# Patient Record
Sex: Male | Born: 1950 | Hispanic: No | Marital: Married | Smoking: Never smoker
Health system: Southern US, Community
[De-identification: ages and names within clinical notes are randomized; demographics above are authoritative.]

## PROBLEM LIST (undated history)

## (undated) DIAGNOSIS — F419 Anxiety disorder, unspecified: Secondary | ICD-10-CM

---

## 2010-03-22 DIAGNOSIS — C801 Malignant (primary) neoplasm, unspecified: Secondary | ICD-10-CM

## 2010-03-22 HISTORY — DX: Malignant (primary) neoplasm, unspecified: C80.1

## 2010-03-22 HISTORY — PX: COLON SURGERY: SHX602

## 2020-06-09 ENCOUNTER — Other Ambulatory Visit (HOSPITAL_COMMUNITY): Payer: Self-pay | Admitting: Neurology

## 2020-06-09 ENCOUNTER — Other Ambulatory Visit: Payer: Self-pay | Admitting: Neurology

## 2020-06-09 DIAGNOSIS — G40009 Localization-related (focal) (partial) idiopathic epilepsy and epileptic syndromes with seizures of localized onset, not intractable, without status epilepticus: Secondary | ICD-10-CM

## 2020-06-24 ENCOUNTER — Other Ambulatory Visit: Payer: Self-pay

## 2020-06-24 ENCOUNTER — Ambulatory Visit (HOSPITAL_COMMUNITY)
Admission: RE | Admit: 2020-06-24 | Discharge: 2020-06-24 | Disposition: A | Discharge status: Home / Self Care | Payer: Medicare HMO | Source: Ambulatory Visit | Attending: Neurology | Admitting: Neurology

## 2020-06-24 DIAGNOSIS — G40009 Localization-related (focal) (partial) idiopathic epilepsy and epileptic syndromes with seizures of localized onset, not intractable, without status epilepticus: Secondary | ICD-10-CM | POA: Insufficient documentation

## 2020-06-24 MED ORDER — GADOBUTROL 1 MMOL/ML IV SOLN
10.0000 mL | Freq: Once | INTRAVENOUS | Status: AC | PRN
  Administered 2020-06-24: 10 mL via INTRAVENOUS

## 2020-09-02 ENCOUNTER — Other Ambulatory Visit: Payer: Self-pay | Admitting: Neurosurgery

## 2020-09-02 ENCOUNTER — Other Ambulatory Visit (HOSPITAL_COMMUNITY): Payer: Self-pay | Admitting: Neurosurgery

## 2020-09-02 DIAGNOSIS — D329 Benign neoplasm of meninges, unspecified: Secondary | ICD-10-CM

## 2020-09-08 ENCOUNTER — Other Ambulatory Visit: Payer: Self-pay | Admitting: Neurosurgery

## 2020-09-12 ENCOUNTER — Ambulatory Visit (HOSPITAL_COMMUNITY)
Admission: RE | Admit: 2020-09-12 | Discharge: 2020-09-12 | Disposition: A | Discharge status: Home / Self Care | Payer: Medicare HMO | Source: Ambulatory Visit | Attending: Neurosurgery | Admitting: Neurosurgery

## 2020-09-12 ENCOUNTER — Other Ambulatory Visit: Payer: Self-pay

## 2020-09-12 DIAGNOSIS — D329 Benign neoplasm of meninges, unspecified: Secondary | ICD-10-CM | POA: Diagnosis not present

## 2020-09-12 MED ORDER — GADOBUTROL 1 MMOL/ML IV SOLN
9.0000 mL | Freq: Once | INTRAVENOUS | Status: AC | PRN
  Administered 2020-09-12: 9 mL via INTRAVENOUS

## 2020-09-19 NOTE — Pre-Procedure Instructions (Signed)
Surgical Instructions    Your procedure is scheduled on Wednesday July 6th.  Report to [facility] Main Entrance "A" at 06:30 A.M., then check in with the Admitting office.  Call this number if you have problems the morning of surgery:  [phone]   If you have any questions prior to your surgery date call [phone]: Open Monday-Friday 8am-4pm    Remember:  Do not eat or drink after midnight the night before your surgery     Take these medicines the morning of surgery with A SIP OF WATER   atorvastatin (LIPITOR)  escitalopram (LEXAPRO)   levETIRAcetam (KEPPRA)  Sodium Chloride-Sodium Bicarb   As of today, STOP taking any Aspirin (unless otherwise instructed by your surgeon) Aleve, Naproxen, Ibuprofen, Motrin, Advil, Goody's, BC's, all herbal medications, fish oil, and all vitamins.                     Do NOT Smoke (Tobacco/Vaping) or drink Alcohol 24 hours prior to your procedure.  If you use a CPAP at night, you may bring all equipment for your overnight stay.   Contacts, glasses, piercing's, hearing aid's, dentures or partials may not be worn into surgery, please bring cases for these belongings.    For patients admitted to the hospital, discharge time will be determined by your treatment team.   Patients discharged the day of surgery will not be allowed to drive home, and someone needs to stay with them for 24 hours.  ONLY 1 SUPPORT PERSON MAY BE PRESENT WHILE YOU ARE IN SURGERY. IF YOU ARE TO BE ADMITTED ONCE YOU ARE IN YOUR ROOM YOU WILL BE ALLOWED TWO (2) VISITORS.  Minor children may have two parents present. Special consideration for safety and communication needs will be reviewed on a case by case basis.   Special instructions:   [place]- Preparing For Surgery  Before surgery, you can play an important role. Because skin is not sterile, your skin needs to be as free of germs as possible. You can reduce the number of germs on your skin by washing with CHG  (chlorahexidine gluconate) Soap before surgery.  CHG is an antiseptic cleaner which kills germs and bonds with the skin to continue killing germs even after washing.    Oral Hygiene is also important to reduce your risk of infection.  Remember - BRUSH YOUR TEETH THE MORNING OF SURGERY WITH YOUR REGULAR TOOTHPASTE  Please do not use if you have an allergy to CHG or antibacterial soaps. If your skin becomes reddened/irritated stop using the CHG.  Do not shave (including legs and underarms) for at least 48 hours prior to first CHG shower. It is OK to shave your face.  Please follow these instructions carefully.   Shower the NIGHT BEFORE SURGERY and the MORNING OF SURGERY  If you chose to wash your hair, wash your hair first as usual with your normal shampoo.  After you shampoo, rinse your hair and body thoroughly to remove the shampoo.  Use CHG Soap as you would any other liquid soap. You can apply CHG directly to the skin and wash gently with a scrungie or a clean washcloth.   Apply the CHG Soap to your body ONLY FROM THE NECK DOWN.  Do not use on open wounds or open sores. Avoid contact with your eyes, ears, mouth and genitals (private parts). Wash Face and genitals (private parts)  with your normal soap.   Wash thoroughly, paying special attention to the area  where your surgery will be performed.  Thoroughly rinse your body with warm water from the neck down.  DO NOT shower/wash with your normal soap after using and rinsing off the CHG Soap.  Pat yourself dry with a CLEAN TOWEL.  Wear CLEAN PAJAMAS to bed the night before surgery  Place CLEAN SHEETS on your bed the night before your surgery  DO NOT SLEEP WITH PETS.   Day of Surgery: Shower with CHG soap. Do not wear jewelry, make up, nail polish, gel polish, artificial nails, or any other type of covering on natural nails including finger and toenails. If patients have artificial nails, gel coating, etc. that need to be removed  by a nail salon please have this removed prior to surgery. Surgery may need to be canceled/delayed if the surgeon/ anesthesia feels like the patient is unable to be adequately monitored. Do not wear lotions, powders, perfumes/colognes, or deodorant. Do not shave 48 hours prior to surgery.  Men may shave face and neck. Do not bring valuables to the hospital. [facility] is not responsible for any belongings or valuables. Wear Clean/Comfortable clothing the morning of surgery Remember to brush your teeth WITH YOUR REGULAR TOOTHPASTE.   Please read over the following fact sheets that you were given.

## 2020-09-23 ENCOUNTER — Encounter (HOSPITAL_COMMUNITY): Payer: Self-pay

## 2020-09-23 ENCOUNTER — Other Ambulatory Visit: Payer: Self-pay

## 2020-09-23 ENCOUNTER — Encounter (HOSPITAL_COMMUNITY)
Admission: RE | Admit: 2020-09-23 | Discharge: 2020-09-23 | Disposition: A | Discharge status: Home / Self Care | Payer: Medicare HMO | Source: Ambulatory Visit | Attending: Neurosurgery | Admitting: Neurosurgery

## 2020-09-23 DIAGNOSIS — Z01818 Encounter for other preprocedural examination: Secondary | ICD-10-CM | POA: Insufficient documentation

## 2020-09-23 DIAGNOSIS — Z20822 Contact with and (suspected) exposure to covid-19: Secondary | ICD-10-CM | POA: Insufficient documentation

## 2020-09-23 HISTORY — DX: Anxiety disorder, unspecified: F41.9

## 2020-09-23 LAB — CBC
HCT: 45.3 % (ref 39.0–52.0)
Hemoglobin: 14.9 g/dL (ref 13.0–17.0)
MCH: 28.8 pg (ref 26.0–34.0)
MCHC: 32.9 g/dL (ref 30.0–36.0)
MCV: 87.5 fL (ref 80.0–100.0)
Platelets: 230 10*3/uL (ref 150–400)
RBC: 5.18 MIL/uL (ref 4.22–5.81)
RDW: 12.8 % (ref 11.5–15.5)
WBC: 9.1 10*3/uL (ref 4.0–10.5)
nRBC: 0 % (ref 0.0–0.2)

## 2020-09-23 LAB — SARS CORONAVIRUS 2 (TAT 6-24 HRS): SARS Coronavirus 2: NEGATIVE

## 2020-09-23 LAB — BASIC METABOLIC PANEL
Anion gap: 5 (ref 5–15)
BUN: 15 mg/dL (ref 8–23)
CO2: 28 mmol/L (ref 22–32)
Calcium: 9.1 mg/dL (ref 8.9–10.3)
Chloride: 105 mmol/L (ref 98–111)
Creatinine, Ser: 1.12 mg/dL (ref 0.61–1.24)
GFR, Estimated: >60 mL/min (ref >60)
Glucose, Bld: 102 mg/dL — ABNORMAL HIGH (ref 70–99)
Potassium: 4.1 mmol/L (ref 3.5–5.1)
Sodium: 138 mmol/L (ref 135–145)

## 2020-09-23 NOTE — Anesthesia Preprocedure Evaluation (Addendum)
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    GI/Hepatic negative GI ROS, Neg liver ROS, H/o colon cancer   Endo/Other  negative endocrine ROS  Renal/GU negative Renal ROS     Musculoskeletal   Abdominal   Peds  Hematology negative hematology ROS (+)   Anesthesia Other Findings   Reproductive/Obstetrics                            Anesthesia Physical Anesthesia Plan  ASA: 3  Anesthesia Plan: General   Post-op Pain Management:    Induction: Intravenous  PONV Risk Score and Plan: 2 and Ondansetron and Dexamethasone  Airway Management Planned: Oral ETT  Additional Equipment: Arterial line  Intra-op Plan:   Post-operative Plan: Possible Post-op intubation/ventilation  Informed Consent: I have reviewed the patients History and Physical, chart, labs and discussed the procedure including the risks, benefits and alternatives for the proposed anesthesia with the patient or authorized representative who has indicated his/her understanding and acceptance.     Dental advisory given  Plan Discussed with: CRNA and Surgeon  Anesthesia Plan Comments:        Anesthesia Quick Evaluation

## 2020-09-23 NOTE — Progress Notes (Signed)
PCP - Suzzanne Cloud, PA at [facility] Cardiologist - Denies  PPM/ICD - n/a Device Orders - n/a Rep Notified - n/a  Chest x-ray - n/a EKG - 09/23/20. Talked to Lorretta Harp, PA in anesthesia. Per notes patient has elevated BP readings without diagnosis of hypertension. Ok to obtain EKG today.  Stress Test - denies ECHO - denies Cardiac Cath - denies  Sleep Study - denies CPAP - denies  Fasting Blood Sugar - n/a Checks Blood Sugar- n/a  Blood Thinner Instructions: n/a Aspirin Instructions: Per patient he stopped taking Aspirin approximately 2 weeks ago and has never taken Aspirin regularly.   COVID TEST- 09/23/20 at PAT appointment. Pending.    Anesthesia review: No.   Patient denies shortness of breath, fever, cough and chest pain at PAT appointment   All instructions explained to the patient, with a verbal understanding of the material. Patient agrees to go over the instructions while at home for a better understanding. Patient also instructed to self quarantine after being tested for COVID-19. The opportunity to ask questions was provided.

## 2020-09-24 ENCOUNTER — Inpatient Hospital Stay (HOSPITAL_COMMUNITY): Payer: Medicare HMO | Admitting: Certified Registered Nurse Anesthetist

## 2020-09-24 ENCOUNTER — Inpatient Hospital Stay (HOSPITAL_COMMUNITY)
Admission: RE | Admit: 2020-09-24 | Discharge: 2020-10-01 | DRG: 025 | Disposition: A | Discharge status: Rehabilitation Facility | Payer: Medicare HMO | Attending: Neurosurgery | Admitting: Neurosurgery

## 2020-09-24 ENCOUNTER — Inpatient Hospital Stay (HOSPITAL_COMMUNITY)
Admission: RE | Disposition: A | Discharge status: Still In Hospital | Payer: Self-pay | Source: Home / Self Care | Attending: Neurosurgery

## 2020-09-24 DIAGNOSIS — D320 Benign neoplasm of cerebral meninges: Secondary | ICD-10-CM | POA: Diagnosis not present

## 2020-09-24 DIAGNOSIS — G40909 Epilepsy, unspecified, not intractable, without status epilepticus: Secondary | ICD-10-CM | POA: Diagnosis not present

## 2020-09-24 DIAGNOSIS — Z79899 Other long term (current) drug therapy: Secondary | ICD-10-CM

## 2020-09-24 DIAGNOSIS — Z85038 Personal history of other malignant neoplasm of large intestine: Secondary | ICD-10-CM | POA: Diagnosis not present

## 2020-09-24 DIAGNOSIS — Z20822 Contact with and (suspected) exposure to covid-19: Secondary | ICD-10-CM | POA: Diagnosis not present

## 2020-09-24 DIAGNOSIS — R7989 Other specified abnormal findings of blood chemistry: Secondary | ICD-10-CM | POA: Diagnosis not present

## 2020-09-24 DIAGNOSIS — R252 Cramp and spasm: Secondary | ICD-10-CM | POA: Diagnosis not present

## 2020-09-24 DIAGNOSIS — F419 Anxiety disorder, unspecified: Secondary | ICD-10-CM | POA: Diagnosis present

## 2020-09-24 DIAGNOSIS — G8194 Hemiplegia, unspecified affecting left nondominant side: Secondary | ICD-10-CM | POA: Diagnosis present

## 2020-09-24 DIAGNOSIS — R569 Unspecified convulsions: Secondary | ICD-10-CM | POA: Diagnosis present

## 2020-09-24 DIAGNOSIS — G936 Cerebral edema: Secondary | ICD-10-CM | POA: Diagnosis not present

## 2020-09-24 DIAGNOSIS — Z86018 Personal history of other benign neoplasm: Secondary | ICD-10-CM

## 2020-09-24 DIAGNOSIS — Z483 Aftercare following surgery for neoplasm: Secondary | ICD-10-CM | POA: Diagnosis not present

## 2020-09-24 DIAGNOSIS — Z7982 Long term (current) use of aspirin: Secondary | ICD-10-CM

## 2020-09-24 DIAGNOSIS — Z8249 Family history of ischemic heart disease and other diseases of the circulatory system: Secondary | ICD-10-CM

## 2020-09-24 DIAGNOSIS — Z9889 Other specified postprocedural states: Secondary | ICD-10-CM

## 2020-09-24 HISTORY — PX: CRANIOTOMY: SHX93

## 2020-09-24 HISTORY — PX: APPLICATION OF CRANIAL NAVIGATION: SHX6578

## 2020-09-24 LAB — TYPE AND SCREEN
ABO/RH(D): O POS
Antibody Screen: NEGATIVE
Unit division: 0
Unit division: 0

## 2020-09-24 LAB — BPAM RBC
Blood Product Expiration Date: 202208052359
Blood Product Expiration Date: 202208052359
ISSUE DATE / TIME: 202207061014
ISSUE DATE / TIME: 202207061014
Unit Type and Rh: 5100
Unit Type and Rh: 5100

## 2020-09-24 LAB — PREPARE RBC (CROSSMATCH)

## 2020-09-24 LAB — ABO/RH: ABO/RH(D): O POS

## 2020-09-24 SURGERY — CRANIOTOMY TUMOR EXCISION
Anesthesia: General | Laterality: Right

## 2020-09-24 MED ORDER — LIDOCAINE 2% (20 MG/ML) 5 ML SYRINGE
INTRAMUSCULAR | Status: AC
  Filled 2020-09-24: qty 5

## 2020-09-24 MED ORDER — DEXAMETHASONE 4 MG PO TABS
4.0000 mg | ORAL_TABLET | Freq: Four times a day (QID) | ORAL | Status: AC
  Administered 2020-09-24 – 2020-09-26 (×8): 4 mg via ORAL
  Filled 2020-09-24 (×8): qty 1

## 2020-09-24 MED ORDER — POLYETHYLENE GLYCOL 3350 17 G PO PACK: 17.0000 g | PACK | Freq: Every day | ORAL | Status: DC | PRN

## 2020-09-24 MED ORDER — GLYCOPYRROLATE PF 0.2 MG/ML IJ SOSY
PREFILLED_SYRINGE | INTRAMUSCULAR | Status: DC | PRN
  Administered 2020-09-24: .2 mg via INTRAVENOUS

## 2020-09-24 MED ORDER — ACETAMINOPHEN 500 MG PO TABS: 1000.0000 mg | ORAL_TABLET | Freq: Once | ORAL | Status: DC

## 2020-09-24 MED ORDER — CHLORHEXIDINE GLUCONATE 0.12 % MT SOLN: 15.0000 mL | Freq: Once | OROMUCOSAL | Status: DC

## 2020-09-24 MED ORDER — MORPHINE SULFATE (PF) 2 MG/ML IV SOLN: 1.0000 mg | INTRAVENOUS | Status: DC | PRN

## 2020-09-24 MED ORDER — LIDOCAINE-EPINEPHRINE 1 %-1:100000 IJ SOLN
INTRAMUSCULAR | Status: AC
  Filled 2020-09-24: qty 1

## 2020-09-24 MED ORDER — ATORVASTATIN CALCIUM 10 MG PO TABS
10.0000 mg | ORAL_TABLET | Freq: Every day | ORAL | Status: DC
  Administered 2020-09-24 – 2020-10-01 (×8): 10 mg via ORAL
  Filled 2020-09-24 (×8): qty 1

## 2020-09-24 MED ORDER — EPHEDRINE SULFATE-NACL 50-0.9 MG/10ML-% IV SOSY
PREFILLED_SYRINGE | INTRAVENOUS | Status: DC | PRN
  Administered 2020-09-24: 5 mg via INTRAVENOUS

## 2020-09-24 MED ORDER — PROMETHAZINE HCL 25 MG/ML IJ SOLN: 6.2500 mg | INTRAMUSCULAR | Status: DC | PRN

## 2020-09-24 MED ORDER — PROPOFOL 10 MG/ML IV BOLUS
INTRAVENOUS | Status: AC
  Filled 2020-09-24: qty 40

## 2020-09-24 MED ORDER — ROCURONIUM BROMIDE 10 MG/ML (PF) SYRINGE
PREFILLED_SYRINGE | INTRAVENOUS | Status: AC
  Filled 2020-09-24: qty 10

## 2020-09-24 MED ORDER — DEXAMETHASONE 0.5 MG PO TABS: 1.0000 mg | ORAL_TABLET | Freq: Every day | ORAL | Status: DC

## 2020-09-24 MED ORDER — PHENYLEPHRINE HCL-NACL 10-0.9 MG/250ML-% IV SOLN
INTRAVENOUS | Status: DC | PRN
  Administered 2020-09-24: 40 ug/min via INTRAVENOUS

## 2020-09-24 MED ORDER — SODIUM CHLORIDE 0.9 % IV SOLN
0.0500 ug/kg/min | INTRAVENOUS | Status: DC
  Administered 2020-09-24: .1 ug/kg/min via INTRAVENOUS
  Filled 2020-09-24 (×2): qty 5000

## 2020-09-24 MED ORDER — NICARDIPINE HCL IN NACL 20-0.86 MG/200ML-% IV SOLN: 3.0000 mg/h | INTRAVENOUS | Status: DC

## 2020-09-24 MED ORDER — MEPERIDINE HCL 25 MG/ML IJ SOLN: 6.2500 mg | INTRAMUSCULAR | Status: DC | PRN

## 2020-09-24 MED ORDER — CHLORHEXIDINE GLUCONATE 0.12 % MT SOLN
OROMUCOSAL | Status: AC
  Administered 2020-09-24: 15 mL
  Filled 2020-09-24: qty 15

## 2020-09-24 MED ORDER — THROMBIN 5000 UNITS EX SOLR
OROMUCOSAL | Status: DC | PRN
  Administered 2020-09-24: 5 mL via TOPICAL

## 2020-09-24 MED ORDER — BACITRACIN ZINC 500 UNIT/GM EX OINT
TOPICAL_OINTMENT | CUTANEOUS | Status: AC
  Filled 2020-09-24: qty 28.35

## 2020-09-24 MED ORDER — SUGAMMADEX SODIUM 200 MG/2ML IV SOLN
INTRAVENOUS | Status: DC | PRN
  Administered 2020-09-24: 200 mg via INTRAVENOUS

## 2020-09-24 MED ORDER — MIDAZOLAM HCL 2 MG/2ML IJ SOLN
0.5000 mg | Freq: Once | INTRAMUSCULAR | Status: DC | PRN
Start: 2020-09-24 — End: 2020-09-24

## 2020-09-24 MED ORDER — ONDANSETRON HCL 4 MG/2ML IJ SOLN: 4.0000 mg | INTRAMUSCULAR | Status: DC | PRN

## 2020-09-24 MED ORDER — BACITRACIN ZINC 500 UNIT/GM EX OINT
TOPICAL_OINTMENT | CUTANEOUS | Status: DC | PRN
  Administered 2020-09-24: 1 via TOPICAL

## 2020-09-24 MED ORDER — FENTANYL CITRATE (PF) 250 MCG/5ML IJ SOLN
INTRAMUSCULAR | Status: DC | PRN
  Administered 2020-09-24: 100 ug via INTRAVENOUS
  Administered 2020-09-24: 150 ug via INTRAVENOUS
  Administered 2020-09-24: 250 ug via INTRAVENOUS

## 2020-09-24 MED ORDER — CHLORHEXIDINE GLUCONATE CLOTH 2 % EX PADS: 6.0000 | MEDICATED_PAD | Freq: Once | CUTANEOUS | Status: DC

## 2020-09-24 MED ORDER — CEFAZOLIN SODIUM-DEXTROSE 1-4 GM/50ML-% IV SOLN
1.0000 g | Freq: Three times a day (TID) | INTRAVENOUS | Status: AC
  Administered 2020-09-24 – 2020-09-25 (×3): 1 g via INTRAVENOUS
  Filled 2020-09-24 (×3): qty 50

## 2020-09-24 MED ORDER — ONDANSETRON HCL 4 MG/2ML IJ SOLN
INTRAMUSCULAR | Status: DC | PRN
  Administered 2020-09-24: 4 mg via INTRAVENOUS

## 2020-09-24 MED ORDER — PANTOPRAZOLE SODIUM 40 MG PO TBEC
40.0000 mg | DELAYED_RELEASE_TABLET | Freq: Every day | ORAL | Status: DC
  Administered 2020-09-24 – 2020-10-01 (×8): 40 mg via ORAL
  Filled 2020-09-24 (×8): qty 1

## 2020-09-24 MED ORDER — DOCUSATE SODIUM 100 MG PO CAPS
100.0000 mg | ORAL_CAPSULE | Freq: Two times a day (BID) | ORAL | Status: DC
  Administered 2020-09-24 – 2020-10-01 (×13): 100 mg via ORAL
  Filled 2020-09-24 (×14): qty 1

## 2020-09-24 MED ORDER — POTASSIUM CHLORIDE IN NACL 20-0.9 MEQ/L-% IV SOLN
INTRAVENOUS | Status: DC
  Filled 2020-09-24 (×2): qty 1000

## 2020-09-24 MED ORDER — MIDAZOLAM HCL 2 MG/2ML IJ SOLN
INTRAMUSCULAR | Status: AC
  Filled 2020-09-24: qty 2

## 2020-09-24 MED ORDER — ACETAMINOPHEN 650 MG RE SUPP: 650.0000 mg | RECTAL | Status: DC | PRN

## 2020-09-24 MED ORDER — DEXAMETHASONE 4 MG PO TABS
2.0000 mg | ORAL_TABLET | Freq: Two times a day (BID) | ORAL | Status: DC
  Administered 2020-09-30 – 2020-10-01 (×2): 2 mg via ORAL
  Filled 2020-09-24 (×2): qty 1

## 2020-09-24 MED ORDER — GLYCOPYRROLATE PF 0.2 MG/ML IJ SOSY
PREFILLED_SYRINGE | INTRAMUSCULAR | Status: AC
  Filled 2020-09-24: qty 1

## 2020-09-24 MED ORDER — ONDANSETRON HCL 4 MG PO TABS: 4.0000 mg | ORAL_TABLET | ORAL | Status: DC | PRN

## 2020-09-24 MED ORDER — LIDOCAINE 2% (20 MG/ML) 5 ML SYRINGE
INTRAMUSCULAR | Status: DC | PRN
  Administered 2020-09-24: 20 mg via INTRAVENOUS

## 2020-09-24 MED ORDER — SODIUM CHLORIDE 0.9 % IV SOLN: INTRAVENOUS | Status: DC | PRN

## 2020-09-24 MED ORDER — MICROFIBRILLAR COLL HEMOSTAT EX PADS: MEDICATED_PAD | CUTANEOUS | Status: DC | PRN

## 2020-09-24 MED ORDER — LEVETIRACETAM 750 MG PO TABS
750.0000 mg | ORAL_TABLET | Freq: Two times a day (BID) | ORAL | Status: DC
  Administered 2020-09-24 – 2020-10-01 (×14): 750 mg via ORAL
  Filled 2020-09-24 (×14): qty 1

## 2020-09-24 MED ORDER — LIDOCAINE-EPINEPHRINE 1 %-1:100000 IJ SOLN
INTRAMUSCULAR | Status: DC | PRN
  Administered 2020-09-24: 10 mL

## 2020-09-24 MED ORDER — ENALAPRILAT 1.25 MG/ML IV SOLN
1.2500 mg | Freq: Four times a day (QID) | INTRAVENOUS | Status: DC | PRN
  Filled 2020-09-24: qty 1

## 2020-09-24 MED ORDER — ROCURONIUM BROMIDE 10 MG/ML (PF) SYRINGE
PREFILLED_SYRINGE | INTRAVENOUS | Status: DC | PRN
  Administered 2020-09-24: 20 mg via INTRAVENOUS
  Administered 2020-09-24: 60 mg via INTRAVENOUS
  Administered 2020-09-24: 40 mg via INTRAVENOUS
  Administered 2020-09-24: 30 mg via INTRAVENOUS
  Administered 2020-09-24: 50 mg via INTRAVENOUS

## 2020-09-24 MED ORDER — ESCITALOPRAM OXALATE 10 MG PO TABS
10.0000 mg | ORAL_TABLET | Freq: Every day | ORAL | Status: DC
  Administered 2020-09-24 – 2020-10-01 (×8): 10 mg via ORAL
  Filled 2020-09-24 (×8): qty 1

## 2020-09-24 MED ORDER — HYDROCODONE-ACETAMINOPHEN 5-325 MG PO TABS
1.0000 | ORAL_TABLET | ORAL | Status: DC | PRN
  Administered 2020-09-24: 1 via ORAL
  Filled 2020-09-24: qty 1

## 2020-09-24 MED ORDER — FENTANYL CITRATE (PF) 100 MCG/2ML IJ SOLN: 25.0000 ug | INTRAMUSCULAR | Status: DC | PRN

## 2020-09-24 MED ORDER — MANNITOL 25 % IV SOLN
INTRAVENOUS | Status: DC | PRN
  Administered 2020-09-24: 50 g via INTRAVENOUS

## 2020-09-24 MED ORDER — ONDANSETRON HCL 4 MG/2ML IJ SOLN
INTRAMUSCULAR | Status: AC
  Filled 2020-09-24: qty 2

## 2020-09-24 MED ORDER — OXYCODONE HCL 5 MG/5ML PO SOLN: 5.0000 mg | Freq: Once | ORAL | Status: DC | PRN

## 2020-09-24 MED ORDER — FLEET ENEMA 7-19 GM/118ML RE ENEM: 1.0000 | ENEMA | Freq: Once | RECTAL | Status: DC | PRN

## 2020-09-24 MED ORDER — SODIUM CHLORIDE 0.9% IV SOLUTION: Freq: Once | INTRAVENOUS | Status: DC

## 2020-09-24 MED ORDER — THROMBIN 5000 UNITS EX SOLR
CUTANEOUS | Status: AC
  Filled 2020-09-24: qty 10000

## 2020-09-24 MED ORDER — OXYCODONE HCL 5 MG PO TABS: 5.0000 mg | ORAL_TABLET | Freq: Once | ORAL | Status: DC | PRN

## 2020-09-24 MED ORDER — THROMBIN 20000 UNITS EX SOLR
CUTANEOUS | Status: AC
  Filled 2020-09-24: qty 20000

## 2020-09-24 MED ORDER — SODIUM CHLORIDE 0.9 % IV SOLN
0.0125 ug/kg/min | INTRAVENOUS | Status: DC
  Filled 2020-09-24: qty 2000

## 2020-09-24 MED ORDER — ACETAMINOPHEN 325 MG PO TABS
650.0000 mg | ORAL_TABLET | ORAL | Status: DC | PRN
  Administered 2020-09-25: 650 mg via ORAL
  Filled 2020-09-24 (×2): qty 2

## 2020-09-24 MED ORDER — CEFAZOLIN SODIUM-DEXTROSE 2-4 GM/100ML-% IV SOLN: 2.0000 g | INTRAVENOUS | Status: DC

## 2020-09-24 MED ORDER — THROMBIN 20000 UNITS EX SOLR: CUTANEOUS | Status: DC | PRN

## 2020-09-24 MED ORDER — FENTANYL CITRATE (PF) 250 MCG/5ML IJ SOLN
INTRAMUSCULAR | Status: AC
  Filled 2020-09-24: qty 5

## 2020-09-24 MED ORDER — DEXAMETHASONE SODIUM PHOSPHATE 10 MG/ML IJ SOLN
INTRAMUSCULAR | Status: DC | PRN
  Administered 2020-09-24: 10 mg via INTRAVENOUS

## 2020-09-24 MED ORDER — DEXAMETHASONE SODIUM PHOSPHATE 10 MG/ML IJ SOLN
INTRAMUSCULAR | Status: AC
  Filled 2020-09-24: qty 1

## 2020-09-24 MED ORDER — LACTATED RINGERS IV SOLN: INTRAVENOUS | Status: DC

## 2020-09-24 MED ORDER — DEXAMETHASONE 4 MG PO TABS
2.0000 mg | ORAL_TABLET | Freq: Three times a day (TID) | ORAL | Status: AC
  Administered 2020-09-28 – 2020-09-30 (×6): 2 mg via ORAL
  Filled 2020-09-24 (×6): qty 1

## 2020-09-24 MED ORDER — MIDAZOLAM HCL 2 MG/2ML IJ SOLN
INTRAMUSCULAR | Status: DC | PRN
  Administered 2020-09-24: 2 mg via INTRAVENOUS

## 2020-09-24 MED ORDER — PROPOFOL 10 MG/ML IV BOLUS
INTRAVENOUS | Status: DC | PRN
  Administered 2020-09-24: 30 mg via INTRAVENOUS
  Administered 2020-09-24: 100 mg via INTRAVENOUS
  Administered 2020-09-24: 40 mg via INTRAVENOUS

## 2020-09-24 MED ORDER — DEXAMETHASONE 4 MG PO TABS
4.0000 mg | ORAL_TABLET | Freq: Three times a day (TID) | ORAL | Status: AC
  Administered 2020-09-26 – 2020-09-28 (×6): 4 mg via ORAL
  Filled 2020-09-24 (×7): qty 1

## 2020-09-24 MED ORDER — HEMOSTATIC AGENTS (NO CHARGE) OPTIME: TOPICAL | Status: DC | PRN

## 2020-09-24 MED ORDER — CEFAZOLIN SODIUM-DEXTROSE 2-4 GM/100ML-% IV SOLN
INTRAVENOUS | Status: AC
  Filled 2020-09-24: qty 100

## 2020-09-24 MED ORDER — DEXAMETHASONE 0.5 MG PO TABS: 1.0000 mg | ORAL_TABLET | Freq: Two times a day (BID) | ORAL | Status: DC

## 2020-09-24 MED ORDER — PROMETHAZINE HCL 25 MG PO TABS: 12.5000 mg | ORAL_TABLET | ORAL | Status: DC | PRN

## 2020-09-24 MED ORDER — HEMOSTATIC AGENTS (NO CHARGE) OPTIME
TOPICAL | Status: DC | PRN
  Administered 2020-09-24 (×2): 1 via TOPICAL

## 2020-09-24 MED ORDER — CEFAZOLIN SODIUM-DEXTROSE 2-3 GM-%(50ML) IV SOLR
INTRAVENOUS | Status: DC | PRN
  Administered 2020-09-24 (×2): 2 g via INTRAVENOUS

## 2020-09-24 MED ORDER — ORAL CARE MOUTH RINSE: 15.0000 mL | Freq: Once | OROMUCOSAL | Status: DC

## 2020-09-24 MED ORDER — SODIUM CHLORIDE 0.9 % IR SOLN
Status: DC | PRN
  Administered 2020-09-24 (×2): 1000 mL

## 2020-09-24 MED ORDER — CEFAZOLIN SODIUM 1 G IJ SOLR
INTRAMUSCULAR | Status: AC
  Filled 2020-09-24: qty 20

## 2020-09-24 MED ORDER — 0.9 % SODIUM CHLORIDE (POUR BTL) OPTIME
TOPICAL | Status: DC | PRN
  Administered 2020-09-24 (×4): 1000 mL

## 2020-09-24 MED ORDER — LABETALOL HCL 5 MG/ML IV SOLN: 10.0000 mg | INTRAVENOUS | Status: DC | PRN

## 2020-09-24 MED ORDER — ACETAMINOPHEN 500 MG PO TABS
ORAL_TABLET | ORAL | Status: AC
  Administered 2020-09-24: 1000 mg
  Filled 2020-09-24: qty 2

## 2020-09-24 SURGICAL SUPPLY — 109 items
APL SKNCLS STERI-STRIP NONHPOA (GAUZE/BANDAGES/DRESSINGS)
BAG COUNTER SPONGE SURGICOUNT (BAG) ×4 IMPLANT
BAG SPNG CNTER NS LX DISP (BAG) ×2
BAND INSRT 18 STRL LF DISP RB (MISCELLANEOUS) ×2
BAND RUBBER #18 3X1/16 STRL (MISCELLANEOUS) ×4 IMPLANT
BENZOIN TINCTURE PRP APPL 2/3 (GAUZE/BANDAGES/DRESSINGS) IMPLANT
BLADE CLIPPER SURG (BLADE) ×2 IMPLANT
BLADE SAW GIGLI 16 STRL (MISCELLANEOUS) IMPLANT
BLADE SURG 11 STRL SS (BLADE) ×2 IMPLANT
BLADE SURG 15 STRL LF DISP TIS (BLADE) IMPLANT
BLADE SURG 15 STRL SS (BLADE)
BNDG CMPR 75X41 PLY HI ABS (GAUZE/BANDAGES/DRESSINGS)
BNDG GAUZE ELAST 4 BULKY (GAUZE/BANDAGES/DRESSINGS) IMPLANT
BNDG STRETCH 4X75 STRL LF (GAUZE/BANDAGES/DRESSINGS) IMPLANT
BUR ACORN 9.0 PRECISION (BURR) ×2 IMPLANT
BUR ROUND FLUTED 4 SOFT TCH (BURR) ×2 IMPLANT
BUR SPIRAL ROUTER 2.3 (BUR) ×2 IMPLANT
CANISTER SUCT 3000ML PPV (MISCELLANEOUS) ×4 IMPLANT
CATH VENTRIC 35X38 W/TROCAR LG (CATHETERS) IMPLANT
CLIP VESOCCLUDE MED 6/CT (CLIP) IMPLANT
CNTNR URN SCR LID CUP LEK RST (MISCELLANEOUS) ×2 IMPLANT
CONT SPEC 4OZ STRL OR WHT (MISCELLANEOUS) ×4
COVER BURR HOLE 20 W/TAB UNI (Plate) ×2 IMPLANT
COVER BURR HOLE UNIV 10 (Orthopedic Implant) ×4 IMPLANT
COVER MAYO STAND STRL (DRAPES) IMPLANT
DECANTER SPIKE VIAL GLASS SM (MISCELLANEOUS) ×2 IMPLANT
DRAIN SUBARACHNOID (WOUND CARE) IMPLANT
DRAPE 3/4 80X56 (DRAPES) ×2 IMPLANT
DRAPE HALF SHEET 40X57 (DRAPES) ×2 IMPLANT
DRAPE MICROSCOPE LEICA (MISCELLANEOUS) ×2 IMPLANT
DRAPE NEUROLOGICAL W/INCISE (DRAPES) ×2 IMPLANT
DRAPE STERI IOBAN 125X83 (DRAPES) IMPLANT
DRAPE SURG 17X23 STRL (DRAPES) IMPLANT
DRAPE WARM FLUID 44X44 (DRAPES) ×2 IMPLANT
DRSG ADAPTIC 3X8 NADH LF (GAUZE/BANDAGES/DRESSINGS) IMPLANT
DRSG AQUACEL AG ADV 3.5X 6 (GAUZE/BANDAGES/DRESSINGS) IMPLANT
DRSG AQUACEL AG ADV 3.5X10 (GAUZE/BANDAGES/DRESSINGS) ×2 IMPLANT
DRSG TELFA 3X8 NADH (GAUZE/BANDAGES/DRESSINGS) IMPLANT
DURAPREP 6ML APPLICATOR 50/CS (WOUND CARE) ×2 IMPLANT
ELECT COATED BLADE 2.86 ST (ELECTRODE) ×2 IMPLANT
ELECT REM PT RETURN 9FT ADLT (ELECTROSURGICAL) ×2
ELECTRODE REM PT RTRN 9FT ADLT (ELECTROSURGICAL) ×1 IMPLANT
EVACUATOR 1/8 PVC DRAIN (DRAIN) IMPLANT
EVACUATOR SILICONE 100CC (DRAIN) IMPLANT
FORCEPS BIPO MALIS IRRIG 9X1.5 (NEUROSURGERY SUPPLIES) ×4 IMPLANT
FORCEPS BIPOLAR MALIS 9X.5 ×4 IMPLANT
GAUZE 4X4 16PLY ~~LOC~~+RFID DBL (SPONGE) IMPLANT
GAUZE SPONGE 4X4 12PLY STRL (GAUZE/BANDAGES/DRESSINGS) ×2 IMPLANT
GAUZE XEROFORM 1X8 LF (GAUZE/BANDAGES/DRESSINGS) IMPLANT
GLOVE EXAM NITRILE LRG STRL (GLOVE) IMPLANT
GLOVE EXAM NITRILE XL STR (GLOVE) IMPLANT
GLOVE SURG ENC MOIS LTX SZ7.5 (GLOVE) ×2 IMPLANT
GLOVE SURG LTX SZ7.5 (GLOVE) ×4 IMPLANT
GLOVE SURG POLYISO LF SZ7.5 (GLOVE) IMPLANT
GLOVE SURG PR MICRO ENCORE 7 (GLOVE) ×2 IMPLANT
GLOVE SURG PR MICRO ENCORE 7.5 (GLOVE) ×4 IMPLANT
GLOVE SURG UNDER POLY LF SZ7.5 (GLOVE) ×8 IMPLANT
GOWN STRL REUS W/ TWL LRG LVL3 (GOWN DISPOSABLE) ×3 IMPLANT
GOWN STRL REUS W/ TWL XL LVL3 (GOWN DISPOSABLE) ×2 IMPLANT
GOWN STRL REUS W/TWL 2XL LVL3 (GOWN DISPOSABLE) IMPLANT
GOWN STRL REUS W/TWL LRG LVL3 (GOWN DISPOSABLE) ×6
GOWN STRL REUS W/TWL XL LVL3 (GOWN DISPOSABLE) ×4
GRAFT DURAGEN MATRIX 3WX3L (Graft) ×2 IMPLANT
GRAFT DURAGEN MATRIX 3X3 SNGL (Graft) ×1 IMPLANT
HEMOSTAT POWDER KIT SURGIFOAM (HEMOSTASIS) ×2 IMPLANT
HEMOSTAT SURGICEL 2X14 (HEMOSTASIS) ×2 IMPLANT
HEMOSTAT SURGICEL 2X4 FIBR (HEMOSTASIS) IMPLANT
HOOK DURA 1/2IN (MISCELLANEOUS) ×2 IMPLANT
HOOK RETRACTION 12 ELAST STAY (MISCELLANEOUS) IMPLANT
IV NS 1000ML (IV SOLUTION) ×4
IV NS 1000ML BAXH (IV SOLUTION) ×2 IMPLANT
KIT BASIN OR (CUSTOM PROCEDURE TRAY) ×2 IMPLANT
KIT DRAIN CSF ACCUDRAIN (MISCELLANEOUS) IMPLANT
KIT TURNOVER KIT B (KITS) ×2 IMPLANT
MARKER SPHERE PSV REFLC 13MM (MARKER) ×4 IMPLANT
NEEDLE HYPO 22GX1.5 SAFETY (NEEDLE) ×2 IMPLANT
NEEDLE SPNL 18GX3.5 QUINCKE PK (NEEDLE) IMPLANT
NS IRRIG 1000ML POUR BTL (IV SOLUTION) ×8 IMPLANT
PACK CRANIOTOMY CUSTOM (CUSTOM PROCEDURE TRAY) ×2 IMPLANT
PATTIES SURGICAL .25X.25 (GAUZE/BANDAGES/DRESSINGS) IMPLANT
PATTIES SURGICAL .5 X.5 (GAUZE/BANDAGES/DRESSINGS) ×2 IMPLANT
PATTIES SURGICAL .5 X3 (DISPOSABLE) ×2 IMPLANT
PATTIES SURGICAL 1/4 X 3 (GAUZE/BANDAGES/DRESSINGS) ×2 IMPLANT
PATTIES SURGICAL 1X1 (DISPOSABLE) IMPLANT
PIN MAYFIELD SKULL DISP (PIN) ×2 IMPLANT
SCREW UNIII AXS SD 1.5X4 (Screw) ×28 IMPLANT
SEALANT ADHERUS EXTEND TIP (MISCELLANEOUS) ×2 IMPLANT
SET CARTRIDGE AND TUBING (SET/KITS/TRAYS/PACK) ×2 IMPLANT
SET TUBING IRRIGATION DISP (TUBING) ×4 IMPLANT
SPONGE NEURO XRAY DETECT 1X3 (DISPOSABLE) IMPLANT
SPONGE SURGIFOAM ABS GEL 100 (HEMOSTASIS) ×4 IMPLANT
STAPLER VISISTAT 35W (STAPLE) ×2 IMPLANT
STOCKINETTE STERILE 6X72 (MISCELLANEOUS) IMPLANT
SUT ETHILON 3 0 FSL (SUTURE) IMPLANT
SUT ETHILON 3 0 PS 1 (SUTURE) IMPLANT
SUT MNCRL AB 3-0 PS2 18 (SUTURE) ×2 IMPLANT
SUT NURALON 4 0 TR CR/8 (SUTURE) ×4 IMPLANT
SUT SILK 0 TIES 10X30 (SUTURE) IMPLANT
SUT VIC AB 2-0 CP2 18 (SUTURE) ×4 IMPLANT
TIP SHEAR CVD EXTENDED 36KH (INSTRUMENTS) ×2 IMPLANT
TIP STANDARD 36KHZ (INSTRUMENTS)
TIP STD 36KHZ (INSTRUMENTS) IMPLANT
TOWEL GREEN STERILE (TOWEL DISPOSABLE) ×2 IMPLANT
TOWEL GREEN STERILE FF (TOWEL DISPOSABLE) ×2 IMPLANT
TRAY FOLEY MTR SLVR 16FR STAT (SET/KITS/TRAYS/PACK) ×2 IMPLANT
TUBE CONNECTING 12X1/4 (SUCTIONS) ×2 IMPLANT
UNDERPAD 30X36 HEAVY ABSORB (UNDERPADS AND DIAPERS) IMPLANT
WATER STERILE IRR 1000ML POUR (IV SOLUTION) ×2 IMPLANT
WRENCH TORQUE 36KHZ (INSTRUMENTS) ×2 IMPLANT

## 2020-09-24 NOTE — Anesthesia Procedure Notes (Signed)
Procedure Name: Intubation Date/Time: 09/24/2020 9:09 AM Performed by: Leonor Liv, CRNA Pre-anesthesia Checklist: Patient identified, Emergency Drugs available, Suction available and Patient being monitored Patient Re-evaluated:Patient Re-evaluated prior to induction Oxygen Delivery Method: Circle System Utilized Preoxygenation: Pre-oxygenation with 100% oxygen Induction Type: IV induction Ventilation: Mask ventilation without difficulty Laryngoscope Size: Mac and 4 Grade View: Grade I Tube type: Oral Tube size: 8.0 mm Number of attempts: 1 Airway Equipment and Method: Stylet and Oral airway Placement Confirmation: ETT inserted through vocal cords under direct vision, positive ETCO2 and breath sounds checked- equal and bilateral Secured at: 23 cm Tube secured with: Tape Dental Injury: Teeth and Oropharynx as per pre-operative assessment  Comments: Placed by Rexford Maus, SRNA

## 2020-09-24 NOTE — Transfer of Care (Signed)
Immediate Anesthesia Transfer of Care Note  Patient: Brent Harrington  Procedure(s) Performed: RIGHT CRANIOTOMY FOR RESECTION OF FALCINE MENINGIOMA (Right) APPLICATION OF CRANIAL NAVIGATION (Right)  Patient Location: PACU  Anesthesia Type:General  Level of Consciousness: awake and drowsy  Airway & Oxygen Therapy: Patient Spontanous Breathing and Patient connected to face mask oxygen  Post-op Assessment: Report given to RN and Post -op Vital signs reviewed and stable  Post vital signs: Reviewed and stable  Last Vitals:  Vitals Value Taken Time  BP 120/82 09/24/20 1555  Temp    Pulse 74 09/24/20 1600  Resp 18 09/24/20 1600  SpO2 99 % 09/24/20 1600  Vitals shown include unvalidated device data.  Last Pain:  Vitals:   09/24/20 0648  TempSrc:   PainSc: 0-No pain         Complications: No notable events documented.

## 2020-09-24 NOTE — Op Note (Signed)
Procedure(s): RIGHT CRANIOTOMY FOR RESECTION OF FALCINE MENINGIOMA APPLICATION OF CRANIAL NAVIGATION Procedure Note  Welton Bord male 70 y.o. 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  There was significant associated vasogenic edema in the brain.  On exam, he had mild left leg weakness.    I had a long discussion with Brent Harrington regarding treatment options.  I discussed with him the option of surgical resection given the significant size of the meningioma as well as the associated swelling.  alternatives of continued surveillance was discussed as well.  I discussed with him risks, benefits, alternatives, and expected convalescence.  Risks discussed included, but were not limited to, bleeding, pain, infection, seizure, scar, stroke, recurrence, neurologic deficit, and coma, and death.  Specifically, I discussed with him that I expected some degree of left leg weakness from the location of the tumor in his leg area.  He wished to proceed with surgery.  Informed consent was obtained.     Surgeon: Vallarie Mare   Assistants: Ashok Pall, MD.  Please note there were no qualified trainees available to assist with the procedure.  Assistance was required for aid in retraction of neural structures.  Anesthesia: General endotracheal anesthesia   Procedure Detail   RIGHT CRANIOTOMY FOR RESECTION OF FALCINE MENINGIOMA,  2. APPLICATION OF CRANIAL NAVIGATION 3. Use of microscope for microdissection 4. Modifier 22 for difficulty  The patient was brought to  the op room.  General anesthesia induced and patient was intubated by the anesthesia service.  After appropriate lines and monitors were placed, patient was positioned in the lateral decubitus position with the left side up.  His head was placed in a [place] head holder and affixed to the bed.  He was positioned with the head of bed elevated for the purposes of allowing gravity retraction of his right frontal parietal lobes.  A preoperative MRI was reconstructed into a 3D image.  This allowed for surface match registration using the BrainLab neuro navigation system, allowing for planning of incision and craniotomy.  His scalp was clipped, preprepped with alcohol prepped and draped in sterile fashion.  A timeout was formed.  Preoperative antibiotics, dexamethasone, and mannitol were given.  A curvilinear incision was made over the scalp and the periosteum was swept off the skull.  Self-retaining retractor was used.  Sagittal suture was identified and correlated with the intraoperative neuro navigation.  2 bur holes were placed over the sagittal sinus and 1 more laterally.  These bur holes were used to dissect the inner table of the skull from the dura.  Craniotome was used to perform craniotomy.  Meticulous hemostasis was obtained.  A strip of Gelfoam was placed over the sagittal sinus.  A small arachnoid granulation was noted approximately a centimeter from the sinus and a piece of Gelfoam was placed over it to control venous bleeding.  The dura was then opened in a C-shaped manner and flapped towards the sinus.  A larger vein of Trolard was draining into the sinus  at the midpoint of the craniotomy where the tumor was closest to the surface.  This vein was freed of its arachnoid envelope to allow greater laxity.  Numerous small venules from the adjacent cortex limited further mobilization. as it appeared to be a fairly dominant vein, it was kept intact and irrigated throughout the case, and the interhemispheric  dissection and surgery was performed going anterior and posterior to this vein.  Microscope was introduced in the field to allow for intraoperative microdissection.  The interhemispheric fissure was then dissected sharply..  The tumor was identified with thick arachnoid around it attached to the falx.  Dissection proceeded anterior and posterior to the tumor into the interhemispheric fissure where the ACA arteries were identified.  Thickened arachnoid over the tumor was sharply dissected.  The tumor was then detached from its Falcine attachment with bipolar coagulation of small dural feeders.  This allowed for devascularization of the tumor.  This proceeded until the deep portion of the interhemispheric fissure was reached where the ACAs were protected with a patty.  Small parasitized branches from the ACA to the inferior pole of the tumor were identified and coagulated and cut.  The capsule tumor was then bipolared and then the tumor was debulked internally with CUSA ultrasonic aspirator.  This allowed for infolding of the capsule to help expose the lateral aspect of the tumor.  There were several areas of the tumor which were densely calcified.  A piece of tumor was sent for frozen section and returned as meningioma.  This was especially important given the large bridging vein limited the amount of lateral retraction of the brain.    However, a significant en passage artery was noted exiting the capsule at the posterior aspect.  This was traced along the capsule to the anterior portion where emerging from the interhemispheric fissure the proximal portion of the vessel was.  There was a superficial branch of this vessel going to the motor and sensory area.  Given the fairly large caliber of this vessel as well as it giving a branch to the motor and sensory area, I elected to skeletonize this vessel.  The vessel was dissected as best I could from the superior aspect of the capsule.  There were several small feeders  emerging from the vessel into the tumor which were coagulated and cut.  Just proximal to the branch portion, the vessel was extremely stuck to the capsule and integrated with the tumor.  Therefore, the vessel was skeletonized with micro scissors, and small cuff of capsule was left along the vessel to keep it patent.  Once this vessel was isolated and preserved, it was retracted laterally and protected with cottonoids.  Following this, the tumor was dissected from the medial aspect of the frontoparietal lobes.  There was poor pia arachnoid plane with the brain at this margin.  Circumferential dissection continued after cycles of interval debulking with ultrasonic aspirator.  The inferior aspect of the tumor had respected the pia arachnoid plane better.  The anterior and posterior poles of the tumor were dissected.  Following dissection of this, the tumor was removed in 2 pieces as the bridging vein of Trolard limited the size of the window for tumor removal.  Meticulous hemostasis was obtained.  The falcine attachment was then coagulated thoroughly.  The large bridging vein was noted to be patent at the end of the surgery.  The dura was then closed with 4-0 Nurolon followed by a DuraGen onlay and adherence dural spray.  The bone flap was replaced with Stryker cranial plating system.  The skin was closed with 2-0 Vicryl stitches in buried interrupted fashion followed by staples and a sterile dressing.  Patient was then removed from the Mayfield head holder and extubated by the anesthesia service.  Good tone was noted in his left lower extremity and left upper extremity.  All counts were correct at the end of surgery.  No complications were noted.  Of note, the surgery was very technically challenging given the adherence and involvement of vasculature by the tumor as well as its presence in eloquent motor or sensory area brain.  This is reflected in the extended operative time of the case.   Findings: Large  partially calcified meningioma with parasitization of ACA feeders.  En passage ACA vessel in tumor capsule was skeletonized successfully, with tiny amount of tumor left attached to vessel  estimated Blood Loss:  200 mL         Drains: none         Total IV Fluids: see anesthesia records  Blood Given: none          Specimens: Falcine mass         Implants: Stryker cranial plating system        Complications:  * No complications entered in OR log *         Disposition: PACU - hemodynamically stable.         Condition: stable

## 2020-09-24 NOTE — Anesthesia Postprocedure Evaluation (Signed)
Anesthesia Post Note  Patient: Brent Harrington  Procedure(s) Performed: RIGHT CRANIOTOMY FOR RESECTION OF FALCINE MENINGIOMA (Right) APPLICATION OF CRANIAL NAVIGATION (Right)     Patient location during evaluation: PACU Anesthesia Type: General Level of consciousness: awake and alert, patient cooperative and oriented Pain management: pain level controlled Vital Signs Assessment: post-procedure vital signs reviewed and stable Respiratory status: spontaneous breathing, nonlabored ventilation, respiratory function stable and patient connected to nasal cannula oxygen Cardiovascular status: blood pressure returned to baseline and stable Postop Assessment: no apparent nausea or vomiting Anesthetic complications: no   No notable events documented.  Last Vitals:  Vitals:   09/24/20 1640 09/24/20 1711  BP: 124/86   Pulse: 66   Resp: 20   Temp: 36.4 C 36.4 C  SpO2: 100%     Last Pain:  Vitals:   09/24/20 1711  TempSrc: Oral  PainSc: 3                  Brittanni Cariker,E. Derk Doubek

## 2020-09-24 NOTE — H&P (Signed)
CC: meningioma  HPI:     Patient is a 70 y.o. male presented with focal motor seizures involving his left side.  He was found to have a large falcine meningioma in the motor area.    There are no problems to display for this patient.  Past Medical History:  Diagnosis Date   Anxiety    Cancer [facility]) 2012   Colon Cancer    Past Surgical History:  Procedure Laterality Date   COLON SURGERY  2012    Medications Prior to Admission  Medication Sig Dispense Refill Last Dose   atorvastatin (LIPITOR) 10 MG tablet Take 10 mg by mouth daily.   09/23/2020   escitalopram (LEXAPRO) 10 MG tablet Take 10 mg by mouth daily.   09/23/2020   levETIRAcetam (KEPPRA) 750 MG tablet Take 750 mg by mouth 2 (two) times daily.   09/23/2020   Sodium Chloride-Sodium Bicarb (AYR SALINE NASAL RINSE NA) Place 1 application into the nose daily.   Past Week   aspirin EC 81 MG tablet Take 81 mg by mouth daily. Swallow whole. (Patient not taking: No sig reported)   09/03/2020   No Known Allergies  Social History   Tobacco Use   Smoking status: Never   Smokeless tobacco: Never  Substance Use Topics   Alcohol use: Not Currently    Family History  Problem Relation Age of Onset   Hypertension Mother    Heart disease Father    Diabetes Sister      Review of Systems Pertinent items noted in HPI and remainder of comprehensive ROS otherwise negative.  Objective:   Patient Vitals for the past 8 hrs:  BP Temp Temp src Pulse Resp SpO2 Height Weight  09/24/20 0635 (!) 129/92 98.7 F (37.1 C) Oral 71 18 90 % 5\' 11"  (1.803 m) 95.3 kg   No intake/output data recorded. No intake/output data recorded.      General : Alert, cooperative, no distress, appears stated age   Head:  Normocephalic/atraumatic    Eyes: PERRL, conjunctiva/corneas clear, EOM's intact. Fundi could not be visualized Neck: Supple Chest:  Respirations unlabored Chest wall: no tenderness or deformity Heart: Regular rate and rhythm Abdomen:  Soft, nontender and nondistended Extremities: warm and well-perfused Skin: normal turgor, color and texture Neurologic:  Alert, oriented x 3.  Eyes open spontaneously. PERRL, EOMI, VFC, no facial droop. V1-3 intact.  No dysarthria, tongue protrusion symmetric.  CNII-XII intact. Normal strength, sensation and reflexes throughout.  No pronator drift, full strength in legs, except 4+/5 left foot       Data ReviewCBC:  Lab Results  Component Value Date   WBC 9.1 09/23/2020   RBC 5.18 09/23/2020   BMP:  Lab Results  Component Value Date   GLUCOSE 102 (H) 09/23/2020   CO2 28 09/23/2020   BUN 15 09/23/2020   CREATININE 1.12 09/23/2020   CALCIUM 9.1 09/23/2020   Radiology review:  Large 4.5 cm right inferior falcine meningioma  Assessment:   Active Problems:   * No active hospital problems. *  Meningioma with seizures   Plan:   -  plan for craniotomy for resection

## 2020-09-24 NOTE — Anesthesia Procedure Notes (Signed)
Arterial Line Insertion Start/End7/08/2020 8:10 AM, 09/24/2020 8:15 AM Performed by: Leonor Liv, CRNA, CRNA  Patient location: Pre-op. Preanesthetic checklist: patient identified, IV checked, site marked, risks and benefits discussed, surgical consent, monitors and equipment checked, pre-op evaluation, timeout performed and anesthesia consent Lidocaine 1% used for infiltration Left, radial was placed Catheter size: 20 Fr Hand hygiene performed  and maximum sterile barriers used   Attempts: 1 Procedure performed without using ultrasound guided technique. Following insertion, dressing applied. Post procedure assessment: normal and unchanged  Additional procedure comments: Placed by Rexford Maus, SRNA.

## 2020-09-25 ENCOUNTER — Encounter (HOSPITAL_COMMUNITY): Payer: Self-pay | Admitting: Neurosurgery

## 2020-09-25 ENCOUNTER — Inpatient Hospital Stay (HOSPITAL_COMMUNITY): Payer: Medicare HMO

## 2020-09-25 LAB — SURGICAL PATHOLOGY

## 2020-09-25 LAB — POCT I-STAT 7, (LYTES, BLD GAS, ICA,H+H)
Acid-base deficit: 1 mmol/L (ref 0.0–2.0)
Acid-base deficit: 1 mmol/L (ref 0.0–2.0)
Bicarbonate: 24.7 mmol/L (ref 20.0–28.0)
Bicarbonate: 23.5 mmol/L (ref 20.0–28.0)
Calcium, Ion: 1.15 mmol/L (ref 1.15–1.40)
Calcium, Ion: 1.22 mmol/L (ref 1.15–1.40)
HCT: 35 % — ABNORMAL LOW (ref 39.0–52.0)
HCT: 39 % (ref 39.0–52.0)
Hemoglobin: 11.9 g/dL — ABNORMAL LOW (ref 13.0–17.0)
Hemoglobin: 13.3 g/dL (ref 13.0–17.0)
O2 Saturation: 100 %
O2 Saturation: 100 %
Patient temperature: 35.3
Patient temperature: 35.4
Potassium: 3.8 mmol/L (ref 3.5–5.1)
Potassium: 4.5 mmol/L (ref 3.5–5.1)
Sodium: 141 mmol/L (ref 135–145)
Sodium: 139 mmol/L (ref 135–145)
TCO2: 26 mmol/L (ref 22–32)
TCO2: 25 mmol/L (ref 22–32)
pCO2 arterial: 39.4 mmHg (ref 32.0–48.0)
pCO2 arterial: 36.6 mmHg (ref 32.0–48.0)
pH, Arterial: 7.398 (ref 7.350–7.450)
pH, Arterial: 7.408 (ref 7.350–7.450)
pO2, Arterial: 223 mmHg — ABNORMAL HIGH (ref 83.0–108.0)
pO2, Arterial: 188 mmHg — ABNORMAL HIGH (ref 83.0–108.0)

## 2020-09-25 MED ORDER — GADOBUTROL 1 MMOL/ML IV SOLN
9.5000 mL | Freq: Once | INTRAVENOUS | Status: AC | PRN
  Administered 2020-09-25: 9.5 mL via INTRAVENOUS

## 2020-09-25 MED ORDER — CHLORHEXIDINE GLUCONATE CLOTH 2 % EX PADS
6.0000 | MEDICATED_PAD | Freq: Every day | CUTANEOUS | Status: DC
  Administered 2020-09-25 – 2020-09-27 (×3): 6 via TOPICAL

## 2020-09-25 MED FILL — Thrombin For Soln 20000 Unit: CUTANEOUS | Qty: 1 | Status: AC

## 2020-09-25 NOTE — Evaluation (Signed)
Physical Therapy Evaluation Patient Details Name: Brent Harrington MRN: [id number] DOB: 12-02-1950 Today's Date: 09/25/2020   History of Present Illness  The pt is a 70 yo male presenting s/p R crani for resection of falcine meningioma on 7/6. PMH includes: anxiety, colon cancer.   Clinical Impression  Pt in bed upon arrival of PT, agreeable to evaluation at this time. 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 The pt now presents with limitations in functional mobility, strength, coordination, stability, ROM, and power due to above dx, and will continue to benefit from skilled PT to address these deficits. The pt required modA to complete bed mobility to achieve sitting EOB, where he continued to require UE support or minA to maintain static sitting balance. The pt was then able to attempt squat-pivot on RLE, but required maxA to achieve minimal hip clearance. The pt is challenged by extensor tone in LLE, decreased coordination, motor planning, and strength. The pt will continue to benefit from skilled PT acutely and CIR level therapies at d/c to maximize functional recovery.      Follow Up Recommendations CIR    Equipment Recommendations   (defer to post acute)    Recommendations for Other Services Rehab consult     Precautions / Restrictions Precautions Precautions: Fall Restrictions Weight Bearing Restrictions: No      Mobility  Bed Mobility Overal bed mobility: Needs Assistance Bed Mobility: Rolling;Sidelying to Sit;Sit to Supine Rolling: Mod assist Sidelying to sit: Mod assist   Sit to supine: Max assist;+2 for safety/equipment   General bed mobility comments: modA to come to sitting, assist to BLE to move off EOB due to no active movement in LLE. maxA to return to supine, needing assist to BLE and to reposition in bed    Transfers Overall transfer level: Needs assistance Equipment  used: 1 person hand held assist Transfers: Squat Pivot Transfers;Lateral/Scoot Transfers     Squat pivot transfers: Total assist    Lateral/Scoot Transfers: Total assist General transfer comment: attempted squat pivot on RLE with lateral scoot along EOB. pt able ot generate 1-2 inches of hip clearance with maxA for PT, needing max cues for body positioning, technique. no active movement/assist from LLE  Ambulation/Gait             General Gait Details: deferred as pt unable to acomplete stand   Modified Rankin (Stroke Patients Only) Modified Rankin (Stroke Patients Only) Pre-Morbid Rankin Score: No symptoms Modified Rankin: Severe disability     Balance Overall balance assessment: Needs assistance Sitting-balance support: Single extremity supported;Feet supported Sitting balance-Leahy Scale: Poor Sitting balance - Comments: reliant on UE support or minA Postural control: Left lateral lean                                   Pertinent Vitals/Pain Pain Assessment: No/denies pain    Home Living Family/patient expects to be discharged to:: Private residence Living Arrangements: Spouse/significant other;Children Available Help at Discharge: Family;Available 24 hours/day Type of Home: House Home Access: Stairs to enter Entrance Stairs-Rails: None Entrance Stairs-Number of Steps: 2 Home Layout: Multi-level;Able to live on main level with bedroom/bathroom        Prior Function Level of Independence: Independent         Comments: independent, still working part time as a IT sales professional  Dominance   Dominant Hand: Right    Extremity/Trunk Assessment   Upper Extremity Assessment Upper Extremity Assessment: LUE deficits/detail;RUE deficits/detail RUE Coordination: decreased fine motor;decreased gross motor LUE Deficits / Details: grossly 4-/5 at grip and wrist, 3/5 bicep/tricep, but trace activation only at shoulder. LUE Sensation: decreased  light touch LUE Coordination: decreased fine motor;decreased gross motor    Lower Extremity Assessment Lower Extremity Assessment: RLE deficits/detail;LLE deficits/detail RLE Deficits / Details: grossly 4/5, but needing a few tries with each command to execute movements appropriately. pt stating this may be due to arthritis, question coordination. RLE Sensation: WNL RLE Coordination: decreased fine motor;decreased gross motor LLE Deficits / Details: no active movements to command, pt with significant extensor hypertonicity at rest as well as with attempts at PROM from therapist. unable to attain neutral at L ankle (lacking ~10 degrees). LLE Sensation: decreased light touch LLE Coordination: decreased fine motor;decreased gross motor    Cervical / Trunk Assessment Cervical / Trunk Assessment: Other exceptions Cervical / Trunk Exceptions: leaning to L  Communication   Communication: No difficulties  Cognition Arousal/Alertness: Awake/alert Behavior During Therapy: WFL for tasks assessed/performed;Impulsive (mild impulsivity, redirectable) Overall Cognitive Status: Impaired/Different from baseline Area of Impairment: Problem solving                             Problem Solving: Decreased initiation;Requires verbal cues General Comments: VC for problem solving and safety, pt needing cues for technique for all movements      General Comments General comments (skin integrity, edema, etc.): SpO2 88-92% on 3L O2. HR to 100s with activity    Exercises General Exercises - Upper Extremity Shoulder Flexion: PROM;Left;5 reps Shoulder Extension: PROM;Left;5 reps Shoulder ABduction: PROM;Left;5 reps Shoulder ADduction: AAROM;Left;5 reps Elbow Flexion: AROM;Left;10 reps;Seated Elbow Extension: AROM;Left;10 reps;Supine Wrist Flexion: AROM;Left;10 reps;Supine Wrist Extension: AROM;Left;10 reps;Supine Digit Composite Flexion: AROM;Left;10 reps Composite Extension: AROM;Left;10  reps General Exercises - Lower Extremity Ankle Circles/Pumps: AROM;Right;PROM;Left;10 reps Quad Sets: AROM;Right;PROM;Left;10 reps;Supine Heel Slides: AROM;Right;PROM;Left;10 reps;Supine   Assessment/Plan    PT Assessment Patient needs continued PT services  PT Problem List Decreased strength;Decreased range of motion;Decreased mobility;Decreased activity tolerance;Decreased balance;Decreased coordination;Decreased safety awareness;Impaired sensation;Impaired tone       PT Treatment Interventions Gait training;DME instruction;Stair training;Functional mobility training;Therapeutic activities;Therapeutic exercise;Balance training;Neuromuscular re-education;Patient/family education    PT Goals (Current goals can be found in the Care Plan section)  Acute Rehab PT Goals Patient Stated Goal: return home PT Goal Formulation: With patient Time For Goal Achievement: 10/09/20 Potential to Achieve Goals: Good    Frequency Min 4X/week   Barriers to discharge Decreased caregiver support         AM-PAC PT "6 Clicks" Mobility  Outcome Measure Help needed turning from your back to your side while in a flat bed without using bedrails?: A Lot Help needed moving from lying on your back to sitting on the side of a flat bed without using bedrails?: A Lot Help needed moving to and from a bed to a chair (including a wheelchair)?: Total Help needed standing up from a chair using your arms (e.g., wheelchair or bedside chair)?: Total Help needed to walk in hospital room?: Total Help needed climbing 3-5 steps with a railing? : Total 6 Click Score: 8    End of Session Equipment Utilized During Treatment: Gait belt;Oxygen Activity Tolerance: Patient tolerated treatment well Patient left: in bed;with call bell/phone within reach;with bed alarm set Nurse Communication: Mobility status PT Visit  Diagnosis: Other abnormalities of gait and mobility (R26.89);Hemiplegia and hemiparesis Hemiplegia -  Right/Left: Left Hemiplegia - dominant/non-dominant: Non-dominant Hemiplegia - caused by:  (tumor resection)    Time: 0174-9449 PT Time Calculation (min) (ACUTE ONLY): 42 min   Charges:   PT Evaluation $PT Eval Moderate Complexity: 1 Mod PT Treatments $Therapeutic Exercise: 8-22 mins $Therapeutic Activity: 8-22 mins        Karma Ganja, PT, DPT   Acute Rehabilitation Department Pager #: [phone]  Otho Bellows 09/25/2020, 3:52 PM

## 2020-09-25 NOTE — Progress Notes (Signed)
Subjective: Patient reports no headache.  Left leg numb  Objective: Vital signs in last 24 hours: Temp:  [97 F (36.1 C)-98.6 F (37 C)] 98.6 F (37 C) (07/07 0800) Pulse Rate:  [57-86] 63 (07/07 1000) Resp:  [0-24] 19 (07/07 1000) BP: (75-131)/(53-98) 121/79 (07/07 1000) SpO2:  [89 %-100 %] 89 % (07/07 1000) Arterial Line BP: (114-154)/(66-90) 144/87 (07/07 1000)  Intake/Output from previous day: 07/06 0701 - 07/07 0700 In: 3205.6 [I.V.:2905.6; IV Piggyback:200] Out: 3130 [Urine:2930; Blood:200] Intake/Output this shift: Total I/O In: 299.7 [I.V.:299.7] Out: 450 [Urine:450]  Awake, alert, Ox3 Speech fluent Dressing c/d Left leg 2/5 strength LUE 3/5 proximally, 4+/5 distally Dense numbness in left leg  Lab Results: Recent Labs    09/23/20 1100  WBC 9.1  HGB 14.9  HCT 45.3  PLT 230   BMET Recent Labs    09/23/20 1100  NA 138  K 4.1  CL 105  CO2 28  GLUCOSE 102*  BUN 15  CREATININE 1.12  CALCIUM 9.1    Studies/Results: No results found.  Assessment/Plan: S/p resection of large falcine meningioma in sensory-motor area - MRI today - dex taper - cont home Keppra indefinitely - PT/OT - expected strength and proprioceptive deficits during recovery; likely CIR candidate - cont ICU for now  Brent Harrington 09/25/2020, 10:50 AM

## 2020-09-25 NOTE — Progress Notes (Signed)
Pt called RN around 0530. Pt c/o "rubber band" sensation around penis. RN assessed. Foley was bloody, and head of penis swollen. Foley removed. Will continue to monitor.

## 2020-09-25 NOTE — Progress Notes (Signed)
Rehab Admissions Coordinator Note:  Patient was screened by Cleatrice Burke for appropriateness for an Inpatient Acute Rehab Consult per therapy recs.   At this time, we are recommending Inpatient Rehab consult. I will place order per protocol.  Cleatrice Burke RN MSN 09/25/2020, 5:40 PM  I can be reached at [phone].

## 2020-09-26 MED ORDER — HEPARIN SODIUM (PORCINE) 5000 UNIT/ML IJ SOLN
5000.0000 [IU] | Freq: Three times a day (TID) | INTRAMUSCULAR | Status: DC
  Administered 2020-09-27 – 2020-10-01 (×14): 5000 [IU] via SUBCUTANEOUS
  Filled 2020-09-26 (×14): qty 1

## 2020-09-26 NOTE — Progress Notes (Signed)
Inpatient Rehab Admissions Coordinator:   I met with Pt. At bedside to discuss potential CIR admit. Pt. States interest and that he has 24/7 support at home. I will open a case with hs insurance.  Clemens Catholic, [place], [place] Admissions Coordinator  [phone] ([place]) [phone] (office)

## 2020-09-26 NOTE — Progress Notes (Signed)
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 PMH includes: anxiety, colon cancer.    PT Comments    The pt was eager to participate in PT session with focus on progressing OOB mobility this morning. He was able to complete x4 sit-stand from EOB with use of modA of 2 and max cues for hip extension, posture (pt preferring L lateral lean), and assist to reduce L knee hyperextension. The pt was unable to safely generate steps at this time, but was able to use the stedy to transfer to recliner. He continues to demo extensor tone and spasticity in LLE that further impacts balance and pt ability to progress gait. Continue to recommend CIR at d/c.    Follow Up Recommendations  CIR     Equipment Recommendations   (defer to post acute)    Recommendations for Other Services       Precautions / Restrictions Precautions Precautions: Fall Restrictions Weight Bearing Restrictions: No    Mobility  Bed Mobility Overal bed mobility: Needs Assistance Bed Mobility: Supine to Sit     Supine to sit: Mod assist;+2 for physical assistance     General bed mobility comments: modA to come to sitting, assist to BLE to move off EOB due to no active movement in LLE. maxA to return to supine, needing assist to BLE and to reposition in bed    Transfers Overall transfer level: Needs assistance Equipment used: 2 person hand held assist Transfers: Sit to/from Stand Sit to Stand: Mod assist;+2 physical assistance   Squat pivot transfers: Total assist     General transfer comment: Stood times 5 trials (4 at EOB and 1 from sara stedy), pt mod A +2 all trials with heavy lean to left, could correct with cues but not maintain  Ambulation/Gait             General Gait Details: pt unable at this time      Modified Rankin (Stroke Patients Only) Modified Rankin (Stroke Patients Only) Pre-Morbid Rankin Score: No symptoms Modified Rankin: Severe disability     Balance Overall balance assessment: Needs assistance Sitting-balance support: Feet supported (intermittent use of RUE on bed rail) Sitting balance-Leahy Scale: Poor Sitting balance - Comments: reliant on UE support or minA Postural control: Left lateral lean Standing balance support: Bilateral upper extremity supported Standing balance-Leahy Scale: Poor Standing balance comment: lean to left, followed all cues to correct posture but unable to maintain, LLE hyper extends                            Cognition Arousal/Alertness: Awake/alert Behavior During Therapy: Impulsive Overall Cognitive Status: Impaired/Different from baseline Area of Impairment: Safety/judgement;Problem solving                         Safety/Judgement: Decreased awareness of safety ("I am just practicing by myself" (when pt not safe to attempt mobility on his own))   Problem Solving: Decreased initiation;Requires verbal cues General Comments: VC for problem solving and safety, pt needing cues for technique for all movements      Exercises Other Exercises Other Exercises: sit-stand x4 from EOB wit modA of 2 and assist to maintain neutral position of LLE and reeduce hyperextension    General  Comments General comments (skin integrity, edema, etc.):  (VSS on RA, pt son present and supportive)      Pertinent Vitals/Pain Pain Assessment: No/denies pain    Home Living Family/patient expects to be discharged to:: Inpatient rehab Living Arrangements: Spouse/significant other;Children Available Help at Discharge: Family;Available 24 hours/day Type of Home: House Home Access: Stairs to enter Entrance Stairs-Rails: None Home Layout: Multi-level;Able to live on main level with bedroom/bathroom        Prior Function Level of  Independence: Independent      Comments: independent, still working part time as a Probation officer (current goals can now be found in the care plan section) Acute Rehab PT Goals Patient Stated Goal: to go home PT Goal Formulation: With patient Time For Goal Achievement: 10/09/20 Potential to Achieve Goals: Good Progress towards PT goals: Progressing toward goals    Frequency    Min 4X/week      PT Plan Current plan remains appropriate    Co-evaluation PT/OT/SLP Co-Evaluation/Treatment: Yes Reason for Co-Treatment: Complexity of the patient's impairments (multi-system involvement);Necessary to address cognition/behavior during functional activity;For patient/therapist safety;To address functional/ADL transfers PT goals addressed during session: Mobility/safety with mobility;Balance OT goals addressed during session: Strengthening/ROM      AM-PAC PT "6 Clicks" Mobility   Outcome Measure  Help needed turning from your back to your side while in a flat bed without using bedrails?: A Lot Help needed moving from lying on your back to sitting on the side of a flat bed without using bedrails?: A Lot Help needed moving to and from a bed to a chair (including a wheelchair)?: Total Help needed standing up from a chair using your arms (e.g., wheelchair or bedside chair)?: Total Help needed to walk in hospital room?: Total Help needed climbing 3-5 steps with a railing? : Total 6 Click Score: 8    End of Session Equipment Utilized During Treatment: Gait belt Activity Tolerance: Patient tolerated treatment well Patient left: with call bell/phone within reach;in chair;with chair alarm set;with family/visitor present Nurse Communication: Mobility status PT Visit Diagnosis: Other abnormalities of gait and mobility (R26.89);Hemiplegia and hemiparesis Hemiplegia - Right/Left: Left Hemiplegia - dominant/non-dominant: Non-dominant Hemiplegia - caused by:  (tumor resection)      Time: 8676-7209 PT Time Calculation (min) (ACUTE ONLY): 37 min  Charges:  $Neuromuscular Re-education: 8-22 mins                     Karma Ganja, PT, DPT   Acute Rehabilitation Department Pager #: [phone]   Otho Bellows 09/26/2020, 4:29 PM

## 2020-09-26 NOTE — Progress Notes (Signed)
Subjective: Patient reports no headache; does feel his left arm is slightly weaker than yesterday  Objective: Vital signs in last 24 hours: Temp:  [97.8 F (36.6 C)-99.1 F (37.3 C)] 97.9 F (36.6 C) (07/08 1600) Pulse Rate:  [51-76] 72 (07/08 1800) Resp:  [13-44] 16 (07/08 1800) BP: (94-135)/(57-104) 134/103 (07/08 1800) SpO2:  [91 %-100 %] 94 % (07/08 1800)  Intake/Output from previous day: 07/07 0701 - 07/08 0700 In: 1152.5 [P.O.:480; I.V.:622.6; IV Piggyback:49.9] Out: 1900 [Urine:1900] Intake/Output this shift: No intake/output data recorded.  NAD Breathing comfortably Awake, alert, Ox3 Speech fluent Full strength on right 2/5 proximal LUE, 4/5 distal LUE 3/5 proximal LLE, 1/5 distal LLE Poor initiation in LLE, Proprioceptive deficit in LLE Dressing intact  Lab Results: Recent Labs    09/24/20 1012 09/24/20 1252  HGB 11.9* 13.3  HCT 35.0* 39.0   BMET Recent Labs    09/24/20 1012 09/24/20 1252  NA 141 139  K 3.8 4.5    Studies/Results: MR BRAIN W WO CONTRAST  Result Date: 09/25/2020 CLINICAL DATA:  Follow-up resection of right para falcine mass. EXAM: MRI HEAD WITHOUT AND WITH CONTRAST TECHNIQUE: Multiplanar, multiecho pulse sequences of the brain and surrounding structures were obtained without and with intravenous contrast. CONTRAST:  9.80mL GADAVIST GADOBUTROL 1 MMOL/ML IV SOLN COMPARISON:  09/12/2020 FINDINGS: Brain: Right frontoparietal vertex craniotomy has been performed. There appears to be gross total resection of the previously seen meningioma in the right para falcine region. Postoperative space contains some fluid and layering blood material. Some non dependent intracranial air as often seen in the immediate postoperative period. Similar pattern of edema persisting at this time within the right hemisphere. 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 Possible 1 or 2 punctate perioperative infarctions within  the adjacent white matter. There is a 1 cm intraparenchymal hemorrhage within the right frontoparietal vertex with mild surrounding edema. No hydrocephalus. No extra-axial fluid collection Vascular: Major vessels at the base of the brain show flow. Skull and upper cervical spine: Otherwise negative Sinuses/Orbits: Inflammatory changes throughout the paranasal sinuses, similar to the preoperative exam. Other: None IMPRESSION: Gross total resection of the right para falcine meningioma. Small amount of fluid and blood in the postoperative space. Persistent edema of the right hemisphere, similar to the preoperative study. 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 Possible 1 or 2 punctate perioperative infarctions in the white matter on the right. 1 cm intraparenchymal hemorrhage at the right frontoparietal vertex with mild surrounding edema Intracranial air as often seen. Electronically Signed   By: Nelson Chimes M.D.   On: 09/25/2020 20:14    Assessment/Plan: S/p craniotomy for resection of falcine meningioma - cont PT/OT - likely good CIR candidate - transfer to PCU - will start hep subQ tomorrow   Vallarie Mare 09/26/2020, 7:20 PM

## 2020-09-26 NOTE — Evaluation (Signed)
Occupational Therapy Evaluation Patient Details Name: Brent Harrington MRN: [id number] DOB: [birth year] Today's Date: 09/26/2020    History of Present Illness The pt is a 70 yo male presenting s/p R crani for resection of falcine meningioma on 7/6. PMH includes: anxiety, colon cancer.   Clinical Impression   This 70 yo male admitted and underwent above presents to acute OT with PLOF of being totally independent with basic ADLs, IADLs, and working a few hours as a Hotel manager. Currently he has decreased AROM of left side, decreased balance (sitting and standing) with cues needed to correct--all affecting his safety and independence with basic self care. He will benefit from acute OT with follow up on CIR for best recovery.    Follow Up Recommendations  CIR;Supervision/Assistance - 24 hour    Equipment Recommendations  Other (comment) (TBD next venue)    Recommendations for Other Services Rehab consult     Precautions / Restrictions Precautions Precautions: Fall Restrictions Weight Bearing Restrictions: No      Mobility Bed Mobility Overal bed mobility: Needs Assistance Bed Mobility: Supine to Sit     Supine to sit: Mod assist;+2 for physical assistance          Transfers Overall transfer level: Needs assistance Equipment used: 2 person hand held assist Transfers: Sit to/from Stand Sit to Stand: Mod assist;+2 physical assistance         General transfer comment: Stood times 5 trials (4 at EOB and 1 from [place] stedy), pt mod A +2 all trials with heavy lean to left, could correct with cues but not maintain    Balance Overall balance assessment: Needs assistance Sitting-balance support: Feet supported (intermttent use of RUE for support) Sitting balance-Leahy Scale: Poor   Postural control: Left lateral lean Standing balance support: Bilateral upper extremity supported Standing balance-Leahy Scale: Poor Standing balance comment: lean to left, followed all  cues to correct posture but unable to maintain, LLE hyper extends                           ADL either performed or assessed with clinical judgement   ADL Overall ADL's : Needs assistance/impaired Eating/Feeding: Set up;Bed level Eating/Feeding Details (indicate cue type and reason): or supported sitting in recliner Grooming: Minimal assistance Grooming Details (indicate cue type and reason): supported sitting Upper Body Bathing: Minimal assistance Upper Body Bathing Details (indicate cue type and reason): supported sitting Lower Body Bathing: Maximal assistance Lower Body Bathing Details (indicate cue type and reason): +2 Mod A sit<>stand Upper Body Dressing : Moderate assistance Upper Body Dressing Details (indicate cue type and reason): supported sitting Lower Body Dressing: Total assistance Lower Body Dressing Details (indicate cue type and reason): +2 Mod A sit<>stand Toilet Transfer: Moderate assistance;+2 for physical assistance;+2 for safety/equipment Toilet Transfer Details (indicate cue type and reason): use of sara stedy Toileting- Clothing Manipulation and Hygiene: Total assistance Toileting - Clothing Manipulation Details (indicate cue type and reason): +2 Mod A sit<>stand             Vision Patient Visual Report: No change from baseline              Pertinent Vitals/Pain Pain Assessment: No/denies pain     Hand Dominance Right   Extremity/Trunk Assessment Upper Extremity Assessment Upper Extremity Assessment: LUE deficits/detail LUE Deficits / Details: functional grip, wrist and forearm movements, 3/5 tricep (bicep as well with increased time), 1/5 at shoulder LUE Coordination: decreased fine motor;decreased  gross motor           Communication Communication Communication: No difficulties   Cognition Arousal/Alertness: Awake/alert Behavior During Therapy: Impulsive Overall Cognitive Status: Impaired/Different from baseline Area of  Impairment: Safety/judgement;Problem solving                         Safety/Judgement: Decreased awareness of safety ("I was just trying myself"--not safe to move on his own)                          Home Living Family/patient expects to be discharged to:: Inpatient rehab Living Arrangements: Spouse/significant other;Children Available Help at Discharge: Family;Available 24 hours/day Type of Home: House Home Access: Stairs to enter [employer] of Steps: 2 Entrance Stairs-Rails: None Home Layout: Multi-level;Able to live on main level with bedroom/bathroom     Bathroom Shower/Tub: Teacher, early years/pre: Standard                Prior Functioning/Environment Level of Independence: Independent        Comments: independent, still working part time as a Physiological scientist Problem List: Decreased strength;Decreased range of motion;Impaired balance (sitting and/or standing);Decreased coordination;Impaired UE functional use;Impaired tone;Impaired sensation      OT Treatment/Interventions: Self-care/ADL training;DME and/or AE instruction;Patient/family education;Balance training;Therapeutic exercise;Therapeutic activities;Neuromuscular education    OT Goals(Current goals can be found in the care plan section) Acute Rehab OT Goals Patient Stated Goal: to go home OT Goal Formulation: With patient Time For Goal Achievement: 10/10/20 Potential to Achieve Goals: Good  OT Frequency: Min 2X/week           Co-evaluation PT/OT/SLP Co-Evaluation/Treatment: Yes Reason for Co-Treatment: For patient/therapist safety PT goals addressed during session: Mobility/safety with mobility;Balance;Strengthening/ROM OT goals addressed during session: Strengthening/ROM      AM-PAC OT "6 Clicks" Daily Activity     Outcome Measure Help from another person eating meals?: A Little Help from another person taking care of personal grooming?: A  Little Help from another person toileting, which includes using toliet, bedpan, or urinal?: A Lot Help from another person bathing (including washing, rinsing, drying)?: A Lot Help from another person to put on and taking off regular upper body clothing?: A Lot Help from another person to put on and taking off regular lower body clothing?: Total 6 Click Score: 13   End of Session Equipment Utilized During Treatment: Gait belt Nurse Communication: Mobility status (use of sara plus)  Activity Tolerance: Patient tolerated treatment well Patient left: in chair;with call bell/phone within reach;with chair alarm set  OT Visit Diagnosis: Unsteadiness on feet (R26.81);Other abnormalities of gait and mobility (R26.89);Muscle weakness (generalized) (M62.81);Hemiplegia and hemiparesis Hemiplegia - Right/Left: Left Hemiplegia - dominant/non-dominant: Non-Dominant Hemiplegia - caused by:  (craniotomy for tumor)                Time: 4235-3614 OT Time Calculation (min): 37 min Charges:  OT General Charges $OT Visit: 1 Visit OT Evaluation $OT Eval Moderate Complexity: [place], OTR/L Acute [employer] Pager [phone] Office [phone]    Almon Register 09/26/2020, 2:57 PM

## 2020-09-27 NOTE — Progress Notes (Signed)
Physical Therapy Treatment Patient Details Name: Brent Harrington MRN: [id number] DOB: 05-Jun-1950 Today's Date: 09/27/2020    History of Present Illness The pt is a 70 yo male presenting s/p R crani for resection of falcine meningioma on 7/6. PMH includes: anxiety, colon cancer.    PT Comments    The pt was eager to participate in PT session with focus on progressing OOB mobility and stability at this time. The pt was able to demo improved power and control for sit-stand transfers, but continues to require modA of 2 as well as max verbal cues for all positioning and sequencing due to decreased coordination, strength, motor planning, and proprioception. The pt also has decreased safety awareness, and requires max cues for safety, to slow down, and for correct technique for exercises. The pt will continue to benefit from skilled PT and CIR level therapies at d/c.     Follow Up Recommendations  CIR     Equipment Recommendations   (defer to post acute)    Recommendations for Other Services       Precautions / Restrictions Precautions Precautions: Fall Restrictions Weight Bearing Restrictions: No    Mobility  Bed Mobility Overal bed mobility: Needs Assistance             General bed mobility comments: pt OOB in recliner at start and end of session    Transfers Overall transfer level: Needs assistance Equipment used: 2 person hand held assist Transfers: Sit to/from Stand Sit to Stand: Mod assist;+2 physical assistance         General transfer comment: sit-stand x8 from recliner. max cues for technique, posture, positioning, midline. pt able to maintain without hyperextension in LLE. addition of partial sit-stand x4, max cues for hip extension and trunk to midline (R lean)  Ambulation/Gait             General Gait Details: pt unable at this time   Stairs             Wheelchair Mobility    Modified Rankin (Stroke Patients Only) Modified Rankin (Stroke  Patients Only) Pre-Morbid Rankin Score: No symptoms Modified Rankin: Severe disability     Balance Overall balance assessment: Needs assistance Sitting-balance support: Feet supported Sitting balance-Leahy Scale: Poor Sitting balance - Comments: reliant on UE support or minA Postural control: Left lateral lean Standing balance support: Bilateral upper extremity supported Standing balance-Leahy Scale: Poor Standing balance comment: lean to left, followed all cues to correct posture but unable to maintain, LLE hyper extends                            Cognition Arousal/Alertness: Awake/alert Behavior During Therapy: Impulsive Overall Cognitive Status: Impaired/Different from baseline Area of Impairment: Safety/judgement;Problem solving                         Safety/Judgement: Decreased awareness of safety   Problem Solving: Decreased initiation;Requires verbal cues General Comments: VC for problem solving and safety, pt needing frequent redirection and max cues for slowed movements, safety. pt tangential with decreased safety awareness.      Exercises General Exercises - Lower Extremity Toe Raises: AROM;Both;5 reps;Supine (partial ROM LLE) Other Exercises Other Exercises: standing wt shift with modA of 2, max cues for upright/midline, cues to inititate movement Other Exercises: partial squats from standing x5    General Comments General comments (skin integrity, edema, etc.): VSS on RA., pt educated on safety,  need for slowed movements, increased attention to L foot placement      Pertinent Vitals/Pain Pain Assessment: No/denies pain     PT Goals (current goals can now be found in the care plan section) Acute Rehab PT Goals Patient Stated Goal: to go home PT Goal Formulation: With patient Time For Goal Achievement: 10/09/20 Potential to Achieve Goals: Good Progress towards PT goals: Progressing toward goals    Frequency    Min 4X/week       PT Plan Current plan remains appropriate       AM-PAC PT "6 Clicks" Mobility   Outcome Measure  Help needed turning from your back to your side while in a flat bed without using bedrails?: A Lot Help needed moving from lying on your back to sitting on the side of a flat bed without using bedrails?: A Lot Help needed moving to and from a bed to a chair (including a wheelchair)?: Total Help needed standing up from a chair using your arms (e.g., wheelchair or bedside chair)?: Total Help needed to walk in hospital room?: Total Help needed climbing 3-5 steps with a railing? : Total 6 Click Score: 8    End of Session Equipment Utilized During Treatment: Gait belt Activity Tolerance: Patient tolerated treatment well Patient left: with call bell/phone within reach;in chair;with chair alarm set;with family/visitor present Nurse Communication: Mobility status PT Visit Diagnosis: Other abnormalities of gait and mobility (R26.89);Hemiplegia and hemiparesis Hemiplegia - Right/Left: Left Hemiplegia - dominant/non-dominant: Non-dominant Hemiplegia - caused by:  (tumor resection)     Time: 2518-9842 PT Time Calculation (min) (ACUTE ONLY): 35 min  Charges:  $Therapeutic Exercise: 8-22 mins $Neuromuscular Re-education: 8-22 mins                     Karma Ganja, PT, DPT   Acute Rehabilitation Department Pager #: [phone]   Otho Bellows 09/27/2020, 2:03 PM

## 2020-09-27 NOTE — Progress Notes (Signed)
NEUROSURGERY PROGRESS NOTE  Doing well. Complains of appropriate headaches Sitting up in bed to chair comfortably.  Still has left hemiparesis with some movement of his left arm.  Very minimal movement in his left toes. Incision CDI, dressing removed.  Keep open to air Awaiting bed placement on progressive. Likely need CIR  Temp:  [97.7 F (36.5 C)-99.1 F (37.3 C)] 98.7 F (37.1 C) (07/09 0800) Pulse Rate:  [48-76] 63 (07/09 0900) Resp:  [16-31] 19 (07/09 0800) BP: (90-141)/(54-122) 141/122 (07/09 0900) SpO2:  [89 %-95 %] 95 % (07/09 0900)   Brent Chiquito, NP 09/27/2020 9:52 AM

## 2020-09-28 NOTE — Progress Notes (Signed)
He looks good today.  He has more movement in the left arm and is now full antigravity in the shoulder.  Incision remains clean dry and intact.  I think he has more movement in his leg with more dorsiflexion and toe wiggling.  He describes tingling in the leg up to the knee.  Less on volitional movement in the leg today.  Pleased with his progress.  Awaiting CIR

## 2020-09-28 NOTE — PMR Pre-admission (Signed)
PMR Admission Coordinator Pre-Admission Assessment   Patient: Brent Harrington is an 69 y.o., male MRN: [id number] DOB: 03/18/1951 Height: 5' 11" (1.803 m) Weight: 95.3 kg   Insurance Information HMO:     PPO: yes     PCP:      IPA:      80/20:      OTHER: PRIMARY: [employer] Medicare      Policy#: [id number]      Subscriber: Pt.  CM Name: Brent Harrington      Phone#: [phone]     Fax#: [phone] Pre-Cert#: [id number].  Auth for CIR given by Lisa  with [employer] on 09/29/20, for 5 days Medicare with updates due to Brent Harrington  at faxed listed above on 10/03/20      Employer: none Benefits:  Phone #:opened via [email or web address] EEff Date: 03/22/2020 - still active Deductible: does not have one OOP Max: $5,900 ($460 met)  CIR: $375/day co-pay with a max co-pay of $1,875/admission (5 days) SNF: $0/day co-pay for days 1-20, $188/day co-pay for days 21-100; limited to 100 days/cal yr. Outpatient:  $35/visit co-pay Home Health:  100% coverage DME: 80% coverage; 20% co-insurance   SECONDARY: none      Policy#:       Phone#:   Financial Counselor:       Phone#:   The "Data Collection Information Summary" for patients in Inpatient Rehabilitation Facilities with attached "Privacy Act Statement-Health Care Records" was provided and verbally reviewed with: Patient   Emergency Contact Information Contact Information       Name Relation Home Work Mobile    Brent Harrington, Brent Harrington Spouse [phone]               Current Medical History  Patient Admitting Diagnosis: Meningioma s/p crani History of Present Illness: Patient is a 69 y.o. 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  There was significant associated vasogenic edema in the brain.  On exam, he had mild left leg weakness.   Pt underwent R craniotomy for resection of falcine meningioma on 09/27/20.  PT. With some residual leg weakness, so CIR was consulted to assist in return to PLOF   Patient's medical record from [facility] has been reviewed by the rehabilitation admission coordinator and physician.   Past Medical History      Past Medical History:  Diagnosis Date   Anxiety     Cancer (HCC) 2012    Colon Cancer      Family History   family history includes Diabetes in his sister; Heart disease in his father; Hypertension in his mother.   Prior Rehab/Hospitalizations Has the patient had prior rehab or hospitalizations prior to admission? No   Has the patient had major surgery during 100 days prior to admission? Yes              Current Medications   Current Facility-Administered Medications:   0.9 % NaCl with KCl 20 mEq/ L  infusion, , Intravenous, Continuous, Jones, David S, MD, Stopped at 09/25/20 1452   acetaminophen (TYLENOL) tablet 650 mg, 650 mg, Oral, Q4H PRN, 650 mg at 09/25/20 0534 **OR** acetaminophen (TYLENOL) suppository 650 mg, 650 mg, Rectal, Q4H PRN, Thomas, Jonathan G, MD   atorvastatin (LIPITOR) tablet 10 mg, 10 mg, Oral, Daily, Thomas, Jonathan G, MD, 10 mg at 09/28/20 1004     Chlorhexidine Gluconate Cloth 2 % PADS 6 each, 6 each, Topical, Daily, Thomas, Jonathan G, MD, 6 each at 09/27/20 1340   [COMPLETED] dexamethasone (DECADRON) tablet 4 mg, 4 mg, Oral, Q6H, 4 mg at 09/26/20 1148 **FOLLOWED BY** dexamethasone (DECADRON) tablet 4 mg, 4 mg, Oral, Q8H, 4 mg at 09/28/20 0538 **FOLLOWED BY** dexamethasone (DECADRON) tablet 2 mg, 2 mg, Oral, Q8H **FOLLOWED BY** [START ON 09/30/2020] dexamethasone (DECADRON) tablet 2 mg, 2 mg, Oral, Q12H **FOLLOWED BY** [START ON 10/02/2020] dexamethasone (DECADRON) tablet 1 mg, 1 mg, Oral, Q12H **FOLLOWED BY** [START ON 10/05/2020] dexamethasone (DECADRON) tablet 1 mg, 1 mg, Oral, Daily, Thomas, Jonathan G, MD   docusate sodium (COLACE) capsule 100 mg, 100 mg, Oral, BID, Thomas, Jonathan G, MD, 100 mg at 09/28/20 1004    enalaprilat (VASOTEC) injection 1.25 mg, 1.25 mg, Intravenous, Q6H PRN, Thomas, Jonathan G, MD   escitalopram (LEXAPRO) tablet 10 mg, 10 mg, Oral, Daily, Thomas, Jonathan G, MD, 10 mg at 09/28/20 1003   heparin injection 5,000 Units, 5,000 Units, Subcutaneous, Q8H, Thomas, Jonathan G, MD, 5,000 Units at 09/28/20 0538   HYDROcodone-acetaminophen (NORCO/VICODIN) 5-325 MG per tablet 1 tablet, 1 tablet, Oral, Q4H PRN, Thomas, Jonathan G, MD, 1 tablet at 09/24/20 1747   labetalol (NORMODYNE) injection 10 mg, 10 mg, Intravenous, Q10 min PRN, Thomas, Jonathan G, MD   levETIRAcetam (KEPPRA) tablet 750 mg, 750 mg, Oral, BID, Thomas, Jonathan G, MD, 750 mg at 09/28/20 1003   morphine 2 MG/ML injection 1-2 mg, 1-2 mg, Intravenous, Q2H PRN, Thomas, Jonathan G, MD   ondansetron (ZOFRAN) tablet 4 mg, 4 mg, Oral, Q4H PRN **OR** ondansetron (ZOFRAN) injection 4 mg, 4 mg, Intravenous, Q4H PRN, Thomas, Jonathan G, MD   pantoprazole (PROTONIX) EC tablet 40 mg, 40 mg, Oral, Daily, Thomas, Jonathan G, MD, 40 mg at 09/28/20 1004   polyethylene glycol (MIRALAX / GLYCOLAX) packet 17 g, 17 g, Oral, Daily PRN, Thomas, Jonathan G, MD   promethazine (PHENERGAN) tablet 12.5-25 mg, 12.5-25 mg, Oral, Q4H PRN, Thomas, Jonathan G, MD   sodium phosphate (FLEET) 7-19 GM/118ML enema 1 enema, 1 enema, Rectal, Once PRN, Thomas, Jonathan G, MD   Diet Order                  Diet regular Room service appropriate? Yes with Assist; Fluid consistency: Thin  Diet effective now                         Precautions / Restrictions Precautions Precautions: Fall Restrictions Weight Bearing Restrictions: No    Has the patient had 2 or more falls or a fall with injury in the past year? No   Prior Activity Level Limited Community (1-2x/wk): Pt. was active and working PTA   Prior Functional Level Self Care: Did the patient need help bathing, dressing, using the toilet or eating? Independent   Indoor Mobility: Did the patient need  assistance with walking from room to room (with or without device)? Independent   Stairs: Did the patient need assistance with internal or external stairs (with or without device)? Independent   Functional Cognition: Did the patient need help planning regular tasks such as shopping or remembering to take medications? Independent   Home Assistive Devices / Equipment   Prior Device Use: Indicate devices/aids used by the patient prior to current illness, exacerbation or injury? None of the above   Current Functional Level Cognition   Overall Cognitive Status: Impaired/Different from baseline Orientation Level: Oriented   X4 Safety/Judgement: Decreased awareness of safety General Comments: VC for problem solving and safety, pt needing frequent redirection and max cues for slowed movements, safety. pt tangential with decreased safety awareness.    Extremity Assessment (includes Sensation/Coordination)   Upper Extremity Assessment: LUE deficits/detail RUE Coordination: decreased fine motor, decreased gross motor LUE Deficits / Details: functional grip, wrist and forearm movements, 3/5 tricep (bicep as well with increased time), 1/5 at shoulder LUE Sensation: decreased light touch LUE Coordination: decreased fine motor, decreased gross motor  Lower Extremity Assessment: RLE deficits/detail, LLE deficits/detail RLE Deficits / Details: grossly 4/5, but needing a few tries with each command to execute movements appropriately. pt stating this may be due to arthritis, question coordination. RLE Sensation: WNL RLE Coordination: decreased fine motor, decreased gross motor LLE Deficits / Details: no active movements to command, pt with significant extensor hypertonicity at rest as well as with attempts at PROM from therapist. unable to attain neutral at L ankle (lacking ~10 degrees). LLE Sensation: decreased light touch LLE Coordination: decreased fine motor, decreased gross motor     ADLs   Overall  ADL's : Needs assistance/impaired Eating/Feeding: Set up, Bed level Eating/Feeding Details (indicate cue type and reason): or supported sitting in recliner Grooming: Minimal assistance Grooming Details (indicate cue type and reason): supported sitting Upper Body Bathing: Minimal assistance Upper Body Bathing Details (indicate cue type and reason): supported sitting Lower Body Bathing: Maximal assistance Lower Body Bathing Details (indicate cue type and reason): +2 Mod A sit<>stand Upper Body Dressing : Moderate assistance Upper Body Dressing Details (indicate cue type and reason): supported sitting Lower Body Dressing: Total assistance Lower Body Dressing Details (indicate cue type and reason): +2 Mod A sit<>stand Toilet Transfer: Moderate assistance, +2 for physical assistance, +2 for safety/equipment Toilet Transfer Details (indicate cue type and reason): use of sara stedy Toileting- Clothing Manipulation and Hygiene: Total assistance Toileting - Clothing Manipulation Details (indicate cue type and reason): +2 Mod A sit<>stand     Mobility   Overal bed mobility: Needs Assistance Bed Mobility: Supine to Sit Rolling: Mod assist Sidelying to sit: Mod assist Supine to sit: Mod assist, +2 for physical assistance Sit to supine: Max assist, +2 for safety/equipment General bed mobility comments: pt OOB in recliner at start and end of session     Transfers   Overall transfer level: Needs assistance Equipment used: 2 person hand held assist Transfer via Lift Equipment: Stedy Transfers: Sit to/from Stand Sit to Stand: Mod assist, +2 physical assistance Squat pivot transfers: Total assist  Lateral/Scoot Transfers: Total assist General transfer comment: sit-stand x8 from recliner. max cues for technique, posture, positioning, midline. pt able to maintain without hyperextension in LLE. addition of partial sit-stand x4, max cues for hip extension and trunk to midline (R lean)     Ambulation /  Gait / Stairs / Wheelchair Mobility   Ambulation/Gait General Gait Details: pt unable at this time     Posture / Balance Dynamic Sitting Balance Sitting balance - Comments: reliant on UE support or minA Balance Overall balance assessment: Needs assistance Sitting-balance support: Feet supported Sitting balance-Leahy Scale: Poor Sitting balance - Comments: reliant on UE support or minA Postural control: Left lateral lean Standing balance support: Bilateral upper extremity supported Standing balance-Leahy Scale: Poor Standing balance comment: lean to left, followed all cues to correct posture but unable to maintain, LLE hyper extends     Special needs/care consideration Skin R crani incision    Previous Home Environment (from acute therapy   documentation) Living Arrangements: Spouse/significant other, Children  Lives With: Family Available Help at Discharge: Family, Available 24 hours/day Type of Home: House Home Layout: Multi-level, Able to live on main level with bedroom/bathroom Home Access: Stairs to enter Entrance Stairs-Rails: None Entrance Stairs-Number of Steps: 2 Bathroom Shower/Tub: Tub/shower unit Bathroom Toilet: Standard Bathroom Accessibility: Yes How Accessible: Accessible via walker   Discharge Living Setting Plans for Discharge Living Setting: Patient's home Type of Home at Discharge: House Discharge Home Layout: Multi-level, Able to live on main level with bedroom/bathroom Discharge Home Access: Stairs to enter Entrance Stairs-Rails: None Entrance Stairs-Number of Steps: 2 Discharge Bathroom Shower/Tub: Tub/shower unit Discharge Bathroom Toilet: Standard Discharge Bathroom Accessibility: Yes How Accessible: Accessible via walker   Social/Family/Support Systems Patient Roles: Spouse Contact Information: [phone] Anticipated Caregiver: Brenda Ericson (sister) is retired LPN and can stay with pt. While kids work. Pt. Is separated from his wife.   Anticipated Caregiver's Contact Information: [phone].  Ability/Limitations of Caregiver: Can provide Min A Caregiver Availability: 24/7 Discharge Plan Discussed with Primary Caregiver: Yes Is Caregiver In Agreement with Plan?: Yes   Goals Patient/Family Goal for Rehab: PT/OT/SLP min A Expected length of stay: 18-21 days Pt/Family Agrees to Admission and willing to participate: Yes Program Orientation Provided & Reviewed with Pt/Caregiver Including Roles  & Responsibilities: Yes   Decrease burden of Care through IP rehab admission: Specialzed equipment needs, Decrease number of caregivers, Bowel and bladder program, and Patient/family education   Possible need for SNF placement upon discharge: not anticipated    Patient Condition: I have reviewed medical records from [facility], spoken with CM, and patient and spouse. I met with patient at the bedside and discussed via phone for inpatient rehabilitation assessment.  Patient will benefit from ongoing PT, OT, and SLP, can actively participate in 3 hours of therapy a day 5 days of the week, and can make measurable gains during the admission.  Patient will also benefit from the coordinated team approach during an Inpatient Acute Rehabilitation admission.  The patient will receive intensive therapy as well as Rehabilitation physician, nursing, social worker, and care management interventions.  Due to safety, skin/wound care, disease management, medication administration, pain management, and patient education the patient requires 24 hour a day rehabilitation nursing.  The patient is currently mod+2 with mobility and basic ADLs.  Discharge setting and therapy post discharge at home with home health is anticipated.  Patient has agreed to participate in the Acute Inpatient Rehabilitation Program and will admit today.   Preadmission Screen Completed By:  Laura B Staley, 09/28/2020 11:43  AM ______________________________________________________________________   Discussed status with Dr. Perkins Molina  on 10/01/20 at 930 and received approval for admission today.   Admission Coordinator:  Laura B Staley, CCC-SLP, time 1000/Date 10/01/20    Assessment/Plan: Diagnosis:Meningioma, frontal s/p resection Does the need for close, 24 hr/day Medical supervision in concert with the patient's rehab needs make it unreasonable for this patient to be served in a less intensive setting? Yes Co-Morbidities requiring supervision/potential complications: Neurologic gait disorder, seizure disorder, new onset, post op pain, post op wound care, monitoring Due to bladder management, bowel management, safety, skin/wound care, disease management, medication administration, pain management, and patient education, does the patient require 24 hr/day rehab nursing? Yes Does the patient require coordinated care of a physician, rehab nurse, PT, OT, and SLP to address physical and functional deficits in the context of the above medical diagnosis(es)? Yes Addressing deficits in the following areas: balance, endurance, locomotion, strength,   transferring, bowel/bladder control, bathing, dressing, toileting, cognition, and psychosocial support Can the patient actively participate in an intensive therapy program of at least 3 hrs of therapy 5 days a week? Yes The potential for patient to make measurable gains while on inpatient rehab is good Anticipated functional outcomes upon discharge from inpatient rehab: modified independent and supervision PT, modified independent and supervision OT, modified independent and supervision SLP Estimated rehab length of stay to reach the above functional goals is: 18-21d Anticipated discharge destination: Home 10. Overall Rehab/Functional Prognosis: good     MD Signature: Liz Pinho E. Ridge Lafond M.D. [facility] Fellow Am Acad of Phys Med and Rehab Diplomate Am Board of  Electrodiagnostic Med Fellow Am Board of Interventional Pain  

## 2020-09-29 MED ORDER — BACITRACIN ZINC 500 UNIT/GM EX OINT
TOPICAL_OINTMENT | Freq: Two times a day (BID) | CUTANEOUS | Status: DC
  Administered 2020-09-29 – 2020-09-30 (×2): 1 via TOPICAL
  Filled 2020-09-29: qty 28.4

## 2020-09-29 NOTE — Progress Notes (Signed)
Subjective: Patient reports improved strength and function in left arm.  Has tingling in left leg.  Sometimes L leg feels stiff, but currently limp.  Objective: Vital signs in last 24 hours: Temp:  [98 F (36.7 C)-98.7 F (37.1 C)] 98 F (36.7 C) (07/11 0400) Pulse Rate:  [56-70] 60 (07/11 0400) Resp:  [14-20] 20 (07/11 0400) BP: (99-125)/(73-82) 99/73 (07/11 0000) SpO2:  [93 %-98 %] 93 % (07/10 1943)  Intake/Output from previous day: 07/10 0701 - 07/11 0700 In: -  Out: 2500 [Urine:2500] Intake/Output this shift: No intake/output data recorded.  Awake, alert, Ox3 NAD 4/5 LUE 1/5 strength LLE  Incision c/d  Lab Results: No results for input(s): WBC, HGB, HCT, PLT in the last 72 hours. BMET No results for input(s): NA, K, CL, CO2, GLUCOSE, BUN, CREATININE, CALCIUM in the last 72 hours.  Studies/Results: No results found.  Assessment/Plan: S/p resection of meningioma - dex taper - PT/OT - likely CIR  Brent Harrington 09/29/2020, 8:41 AM

## 2020-09-29 NOTE — Progress Notes (Signed)
Inpatient Rehab Admissions Coordinator:   I do not yet have insurance auth or a bed on CIR for this pt. Today. I opened a case with [employer] medicare on Friday and await a decision. I will continue to follow for potential admission pending insurance auth and bed availability.  Clemens Catholic, [place], [place] Admissions Coordinator  [phone] ([place]) [phone] (office)

## 2020-09-29 NOTE — Progress Notes (Signed)
Physical Therapy Treatment Patient Details Name: Brent Harrington MRN: [id number] DOB: 11/24/1950 Today's Date: 09/29/2020    History of Present Illness The pt is a 70 yo male presenting s/p R crani for resection of falcine meningioma on 7/6. PMH includes: anxiety, colon cancer.    PT Comments    The pt remains eager to participate in PT session this morning. He reports decrease in volitional movement of LLE at this time. The pt was then challenged by repeated sit-stand transfers, progressing from Caldwell of 2 to Avis of 2 with use of RW with facilitations at L knee to maintain slight knee flexion and increase hip extension. The pt continues to demo decreased proprioception, but is better able to identify midline with standing balance. With progressive sit-stand and mini-squats, the pt was able to demo improved activation and control of proximal LLE musculature. Will continue to benefit from skilled PT to progress strength, motor control, safety awareness, and stability. Continue to recommend CIR level therapies at d/c.     Follow Up Recommendations  CIR     Equipment Recommendations   (defer to post acute)    Recommendations for Other Services       Precautions / Restrictions Precautions Precautions: Fall Restrictions Weight Bearing Restrictions: No    Mobility  Bed Mobility Overal bed mobility: Needs Assistance             General bed mobility comments: pt OOB in recliner at start and end of session    Transfers Overall transfer level: Needs assistance Equipment used: 2 person hand held assist;Rolling walker (2 wheeled) Transfers: Sit to/from Stand Sit to Stand: Mod assist;+2 physical assistance;+2 safety/equipment         General transfer comment: sit-stand x10 from recliner. max cues for technique, hand placement, wt shift, and posture. Pt needing facilitation of L knee flexion, and blocking of LLE to avoid hyperextension or buckling. mini squats x5 with improved  activation of L quads  Ambulation/Gait             General Gait Details: pt unable at this time   Stairs             Wheelchair Mobility    Modified Rankin (Stroke Patients Only) Modified Rankin (Stroke Patients Only) Pre-Morbid Rankin Score: No symptoms Modified Rankin: Severe disability     Balance Overall balance assessment: Needs assistance Sitting-balance support: Feet supported Sitting balance-Leahy Scale: Poor Sitting balance - Comments: reliant on UE support or minA Postural control: Left lateral lean Standing balance support: Bilateral upper extremity supported Standing balance-Leahy Scale: Poor Standing balance comment: lean to left, followed all cues to correct posture but unable to maintain, LLE hyper extends                            Cognition Arousal/Alertness: Awake/alert Behavior During Therapy: Impulsive Overall Cognitive Status: Impaired/Different from baseline Area of Impairment: Safety/judgement;Problem solving                         Safety/Judgement: Decreased awareness of safety   Problem Solving: Decreased initiation;Requires verbal cues General Comments: VC for problem solving, safety, and frequent re-direction to attend to task. pt tangential and making jokes, potentially to mask lack of safety awareness      Exercises Other Exercises Other Exercises: sit-stand from recliner x10; x5 with mini squat addition. modA to power up, assist to facilitate wt shift, and tactile facilitation at  hips and L knee Other Exercises: standing wt shift with R heel-toe raise x10    General Comments General comments (skin integrity, edema, etc.): VSS on RA      Pertinent Vitals/Pain Pain Assessment: No/denies pain     PT Goals (current goals can now be found in the care plan section) Acute Rehab PT Goals Patient Stated Goal: to go home PT Goal Formulation: With patient Time For Goal Achievement: 10/09/20 Potential to  Achieve Goals: Good Progress towards PT goals: Progressing toward goals    Frequency    Min 4X/week      PT Plan Current plan remains appropriate       AM-PAC PT "6 Clicks" Mobility   Outcome Measure  Help needed turning from your back to your side while in a flat bed without using bedrails?: A Lot Help needed moving from lying on your back to sitting on the side of a flat bed without using bedrails?: A Lot Help needed moving to and from a bed to a chair (including a wheelchair)?: Total Help needed standing up from a chair using your arms (e.g., wheelchair or bedside chair)?: A Lot Help needed to walk in hospital room?: Total Help needed climbing 3-5 steps with a railing? : Total 6 Click Score: 9    End of Session Equipment Utilized During Treatment: Gait belt Activity Tolerance: Patient tolerated treatment well Patient left: with call bell/phone within reach;in chair;with chair alarm set;with family/visitor present Nurse Communication: Mobility status PT Visit Diagnosis: Other abnormalities of gait and mobility (R26.89);Hemiplegia and hemiparesis Hemiplegia - Right/Left: Left Hemiplegia - dominant/non-dominant: Non-dominant Hemiplegia - caused by:  (tumor resection)     Time: 4944-9675 PT Time Calculation (min) (ACUTE ONLY): 25 min  Charges:  $Neuromuscular Re-education: 23-37 mins                     Hardie Pulley, DPT   Acute Rehabilitation Department Pager #: [phone]   Otho Bellows 09/29/2020, 12:51 PM

## 2020-09-30 NOTE — Care Management Important Message (Signed)
Important Message  Patient Details  Name: Brent Harrington MRN: [id number] Date of Birth: [birth year]   Medicare Important Message Given:  Yes     [name] Montine Circle 09/30/2020, 3:35 PM

## 2020-09-30 NOTE — Progress Notes (Signed)
Subjective: Patient reports no headache.  Left arm strength has returned to baseline  Objective: Vital signs in last 24 hours: Temp:  [97.5 F (36.4 C)-99 F (37.2 C)] 97.9 F (36.6 C) (07/12 1500) Pulse Rate:  [51-85] 55 (07/12 0705) Resp:  [16-20] 16 (07/12 0705) BP: (114-139)/(78-99) 130/98 (07/12 1500) SpO2:  [94 %-99 %] 99 % (07/12 0705)  Intake/Output from previous day: 07/11 0701 - 07/12 0700 In: 33 [P.O.:960] Out: 501 [Urine:500; Stool:1] Intake/Output this shift: Total I/O In: 720 [P.O.:720] Out: 350 [Urine:350]  Incision c/d NAD A+Ox3 Speech fluent LLE 1/5 strength today LUE with 4+/5 strength  Lab Results: No results for input(s): WBC, HGB, HCT, PLT in the last 72 hours. BMET No results for input(s): NA, K, CL, CO2, GLUCOSE, BUN, CREATININE, CALCIUM in the last 72 hours.  Studies/Results: No results found.  Assessment/Plan: S/p resection of falcine meningioma in motor/sensory area - awaiting CIR - dex taper - hep subQ   Vallarie Mare 09/30/2020, 5:03 PM

## 2020-09-30 NOTE — Progress Notes (Signed)
Inpatient Rehab Admissions Coordinator:   I received insurance auth for CIR. We now await an available bed. I will continue to follow for potential admit pending bed availability.   Clemens Catholic, [place], [place] Admissions Coordinator  [phone] ([place]) [phone] (office)

## 2020-09-30 NOTE — Progress Notes (Signed)
Physical Therapy Treatment Patient Details Name: Brent Harrington MRN: [id number] DOB: 11/13/1950 Today's Date: 09/30/2020    History of Present Illness The pt is a 70 yo male presenting s/p R crani for resection of falcine meningioma on 7/6. PMH includes: anxiety, colon cancer.    PT Comments    Pt is progressing well, sit to stand assist is improving and he would like to get out of the room in a WC next session.  His left leg remains densely impaired and his left arm is improving significantly.  He remains appropriate for CIR level therapies at discharge.  PT will continue to follow acutely for safe mobility progression.  Follow Up Recommendations  CIR     Equipment Recommendations  Wheelchair (measurements PT);Wheelchair cushion (measurements PT);3in1 (PT)    Recommendations for Other Services       Precautions / Restrictions Precautions Precautions: Fall Precaution Comments: left hemi    Mobility  Bed Mobility               General bed mobility comments: pt OOB in recliner at start and end of session    Transfers Overall transfer level: Needs assistance   Transfers: Sit to/from Stand           General transfer comment: Sit to stand multiple times from elevated seat, lower chair, with both hands, left hand only, used stedy for standing balance traning, weight shifting, finding midline.  Ambulation/Gait                 Stairs             Wheelchair Mobility    Modified Rankin (Stroke Patients Only) Modified Rankin (Stroke Patients Only) Pre-Morbid Rankin Score: No symptoms Modified Rankin: Severe disability     Balance Overall balance assessment: Needs assistance Sitting-balance support: Feet supported;No upper extremity supported Sitting balance-Leahy Scale: Good Sitting balance - Comments: seated in stedy pt able to reach outside of BOS and come back to midline with supervision.   Standing balance support: Bilateral upper extremity  supported Standing balance-Leahy Scale: Poor Standing balance comment: pt needs support from standing frame and therapist when hands removed from support surface.  Pt stood in bouts of 5 mins working on weight shfiting, attempting hands free standing or one (left hand) standing.                            Cognition Arousal/Alertness: Awake/alert Behavior During Therapy: Impulsive Overall Cognitive Status: Impaired/Different from baseline Area of Impairment: Safety/judgement;Awareness                         Safety/Judgement: Decreased awareness of safety;Decreased awareness of deficits Awareness: Emergent   General Comments: Pt moves quickly, able to verbalize deficits, but remains impulsive, and unsafe when attempting to move.      Exercises General Exercises - Upper Extremity Shoulder Flexion: AROM;Both;10 reps;Seated Elbow Flexion: AROM;Both;10 reps;Seated General Exercises - Lower Extremity Long Arc Quad: AROM;PROM;Right;Left;10 reps;Seated Hip Flexion/Marching: AROM;PROM;Right;Left;10 reps;Seated Toe Raises: AROM;PROM;Right;Left;20 reps;Seated Other Exercises Other Exercises: sit to stand from stedy seat x10x3 sets, sit to stand from low recliner chair x 10 2 sets, sit to stand from stedy seat with left hand x 5    General Comments General comments (skin integrity, edema, etc.): VSS on RA      Pertinent Vitals/Pain Pain Assessment: No/denies pain    Home Living  Prior Function            PT Goals (current goals can now be found in the care plan section) Acute Rehab PT Goals Patient Stated Goal: to go to rehab as soon as he can Progress towards PT goals: Progressing toward goals    Frequency    Min 4X/week      PT Plan Current plan remains appropriate    Co-evaluation              AM-PAC PT "6 Clicks" Mobility   Outcome Measure  Help needed turning from your back to your side while in a flat bed  without using bedrails?: A Lot Help needed moving from lying on your back to sitting on the side of a flat bed without using bedrails?: A Lot Help needed moving to and from a bed to a chair (including a wheelchair)?: A Lot Help needed standing up from a chair using your arms (e.g., wheelchair or bedside chair)?: A Lot Help needed to walk in hospital room?: Total Help needed climbing 3-5 steps with a railing? : Total 6 Click Score: 10    End of Session Equipment Utilized During Treatment: Gait belt Activity Tolerance: Patient tolerated treatment well Patient left: in chair;with call bell/phone within reach;with chair alarm set Nurse Communication: Mobility status PT Visit Diagnosis: Other abnormalities of gait and mobility (R26.89);Hemiplegia and hemiparesis Hemiplegia - Right/Left: Left Hemiplegia - dominant/non-dominant: Non-dominant Hemiplegia - caused by:  (tumor resection)     Time: 1127-1211 PT Time Calculation (min) (ACUTE ONLY): 44 min  Charges:  $Therapeutic Activity: 23-37 mins $Neuromuscular Re-education: 8-22 mins                    Verdene Lennert, PT, DPT  Acute Rehabilitation Ortho Tech Supervisor [phone] pager [phone]) [phone] office

## 2020-09-30 NOTE — Progress Notes (Signed)
Occupational Therapy Treatment Patient Details Name: Brent Harrington MRN: [id number] DOB: Dec 23, 1950 Today's Date: 09/30/2020    History of present illness The pt is a 70 yo male presenting s/p R crani for resection of falcine meningioma on 7/6. PMH includes: anxiety, colon cancer.   OT comments  Pt progressing well with stedy and with sitting balance and standing balance at sink for ADL. Pt currently, set-upA to maxA for ADL; increasing to modA overall for LB dressing, donning socks with figure 4 technique- LLE assisted to stay on RLE for task. Pt with multiple stands with stedy with minA to [place] for stability. Pt continues to be impulsive and requires cues to slow down. Pt would greatly benefit from continued OT skilled services. OT following acutely.   Follow Up Recommendations  CIR;Supervision/Assistance - 24 hour    Equipment Recommendations  3 in 1 bedside commode    Recommendations for Other Services      Precautions / Restrictions Precautions Precautions: Fall Precaution Comments: left hemi Restrictions Weight Bearing Restrictions: No       Mobility Bed Mobility               General bed mobility comments: OOB in recliner pre and post session    Transfers Overall transfer level: Needs assistance   Transfers: Sit to/from Stand           General transfer comment: sit to stand from recliner x3 times and from commode x1 time and from stedy multiple times.    Balance Overall balance assessment: Needs assistance Sitting-balance support: Feet supported;No upper extremity supported Sitting balance-Leahy Scale: Good     Standing balance support: Bilateral upper extremity supported;Single extremity supported;During functional activity Standing balance-Leahy Scale: Poor Standing balance comment: reaching outside of BOS with unilateral hands with heavy reliance on hand on stedy and supported standing.                           ADL either performed  or assessed with clinical judgement   ADL Overall ADL's : Needs assistance/impaired     Grooming: Min guard;Standing Grooming Details (indicate cue type and reason): supported sitting and standingx 2 mins in stedy to work on balance at [employer] Dressing: Moderate assistance;Cueing for safety;Cueing for sequencing;Sitting/lateral leans Lower Body Dressing Details (indicate cue type and reason): donning socks with figure 4; assist for keeping LLE up on RUE for task Toilet Transfer: Moderate assistance;Cueing for safety;Cueing for sequencing Toilet Transfer Details (indicate cue type and reason): with stedy modA Toileting- Clothing Manipulation and Hygiene: Maximal assistance;Sit to/from stand Toileting - Clothing Manipulation Details (indicate cue type and reason): unsafe in standing due to instability     Functional mobility during ADLs: Moderate assistance;Cueing for sequencing;Cueing for safety General ADL Comments: Pt limited by decreased strength, decreased safety awareness, decreased command follow and decreased sensation on LLE for transfers. pt utilizing stedy with increased confidence.     Vision       Perception     Praxis      Cognition Arousal/Alertness: Awake/alert Behavior During Therapy: Impulsive Overall Cognitive Status: Impaired/Different from baseline Area of Impairment: Safety/judgement;Awareness                         Safety/Judgement: Decreased awareness of safety;Decreased awareness of deficits Awareness: Emergent   General Comments: Pt impulsive with quick moves and requires  cues to wait for therapist to be ready for pt. Unsafe due to weakness and decreased sensation on L side.        Exercises Other Exercises Other Exercises: dynamic sitting and standing balance tasks   Shoulder Instructions       General Comments VSS on RA    Pertinent Vitals/ Pain       Pain Assessment: No/denies pain  Home Living                                           Prior Functioning/Environment              Frequency  Min 2X/week        Progress Toward Goals  OT Goals(current goals can now be found in the care plan section)  Progress towards OT goals: Progressing toward goals  Acute Rehab OT Goals Patient Stated Goal: to go to rehab as soon as he can OT Goal Formulation: With patient Time For Goal Achievement: 10/10/20 Potential to Achieve Goals: Good  Plan Discharge plan remains appropriate    Co-evaluation                 AM-PAC OT "6 Clicks" Daily Activity     Outcome Measure   Help from another person eating meals?: A Little Help from another person taking care of personal grooming?: A Little Help from another person toileting, which includes using toliet, bedpan, or urinal?: A Lot Help from another person bathing (including washing, rinsing, drying)?: A Lot Help from another person to put on and taking off regular upper body clothing?: A Lot Help from another person to put on and taking off regular lower body clothing?: A Lot 6 Click Score: 14    End of Session Equipment Utilized During Treatment: Gait belt  OT Visit Diagnosis: Unsteadiness on feet (R26.81);Other abnormalities of gait and mobility (R26.89);Muscle weakness (generalized) (M62.81);Hemiplegia and hemiparesis Hemiplegia - Right/Left: Left Hemiplegia - dominant/non-dominant: Non-Dominant Hemiplegia - caused by: Cerebral infarction   Activity Tolerance Patient tolerated treatment well   Patient Left in chair;with call bell/phone within reach;with chair alarm set;with nursing/sitter in room;Other (comment) (posey belt applied for alarm)   Nurse Communication Mobility status        Time: 1430-1500 OT Time Calculation (min): 30 min  Charges: OT General Charges $OT Visit: 1 Visit OT Treatments $Self Care/Home Management : 8-22 mins $Neuromuscular Re-education: 8-22 mins  Jefferey Pica,  OTR/L Acute Rehabilitation Services Pager: [phone] Office: [phone]    Aylla Huffine C 09/30/2020, 5:43 PM

## 2020-10-01 ENCOUNTER — Other Ambulatory Visit: Payer: Self-pay

## 2020-10-01 ENCOUNTER — Inpatient Hospital Stay (HOSPITAL_COMMUNITY)
Admission: RE | Admit: 2020-10-01 | Discharge: 2020-10-22 | DRG: 949 | Disposition: A | Discharge status: Home Health | Payer: Medicare HMO | Source: Intra-hospital | Attending: Physical Medicine & Rehabilitation | Admitting: Physical Medicine & Rehabilitation

## 2020-10-01 ENCOUNTER — Encounter (HOSPITAL_COMMUNITY): Payer: Self-pay | Admitting: Physical Medicine & Rehabilitation

## 2020-10-01 DIAGNOSIS — G936 Cerebral edema: Secondary | ICD-10-CM | POA: Diagnosis present

## 2020-10-01 DIAGNOSIS — Z79899 Other long term (current) drug therapy: Secondary | ICD-10-CM

## 2020-10-01 DIAGNOSIS — T380X5A Adverse effect of glucocorticoids and synthetic analogues, initial encounter: Secondary | ICD-10-CM | POA: Diagnosis present

## 2020-10-01 DIAGNOSIS — G8194 Hemiplegia, unspecified affecting left nondominant side: Secondary | ICD-10-CM | POA: Diagnosis present

## 2020-10-01 DIAGNOSIS — R569 Unspecified convulsions: Secondary | ICD-10-CM | POA: Diagnosis present

## 2020-10-01 DIAGNOSIS — D72829 Elevated white blood cell count, unspecified: Secondary | ICD-10-CM | POA: Diagnosis present

## 2020-10-01 DIAGNOSIS — Z86018 Personal history of other benign neoplasm: Secondary | ICD-10-CM

## 2020-10-01 DIAGNOSIS — R7989 Other specified abnormal findings of blood chemistry: Secondary | ICD-10-CM | POA: Diagnosis not present

## 2020-10-01 DIAGNOSIS — Z833 Family history of diabetes mellitus: Secondary | ICD-10-CM | POA: Diagnosis not present

## 2020-10-01 DIAGNOSIS — D320 Benign neoplasm of cerebral meninges: Secondary | ICD-10-CM

## 2020-10-01 DIAGNOSIS — M62838 Other muscle spasm: Secondary | ICD-10-CM | POA: Diagnosis present

## 2020-10-01 DIAGNOSIS — Z8249 Family history of ischemic heart disease and other diseases of the circulatory system: Secondary | ICD-10-CM | POA: Diagnosis not present

## 2020-10-01 DIAGNOSIS — Z483 Aftercare following surgery for neoplasm: Principal | ICD-10-CM

## 2020-10-01 DIAGNOSIS — R4587 Impulsiveness: Secondary | ICD-10-CM | POA: Diagnosis present

## 2020-10-01 DIAGNOSIS — G40909 Epilepsy, unspecified, not intractable, without status epilepticus: Secondary | ICD-10-CM | POA: Diagnosis not present

## 2020-10-01 DIAGNOSIS — Z85038 Personal history of other malignant neoplasm of large intestine: Secondary | ICD-10-CM | POA: Diagnosis not present

## 2020-10-01 DIAGNOSIS — F32A Depression, unspecified: Secondary | ICD-10-CM | POA: Diagnosis present

## 2020-10-01 DIAGNOSIS — R252 Cramp and spasm: Secondary | ICD-10-CM | POA: Diagnosis not present

## 2020-10-01 DIAGNOSIS — Z9889 Other specified postprocedural states: Secondary | ICD-10-CM

## 2020-10-01 MED ORDER — PROCHLORPERAZINE 25 MG RE SUPP: 12.5000 mg | Freq: Four times a day (QID) | RECTAL | Status: DC | PRN

## 2020-10-01 MED ORDER — DEXAMETHASONE 2 MG PO TABS
1.0000 mg | ORAL_TABLET | Freq: Two times a day (BID) | ORAL | Status: AC
  Administered 2020-10-02 – 2020-10-04 (×4): 1 mg via ORAL
  Filled 2020-10-01 (×4): qty 1

## 2020-10-01 MED ORDER — DEXAMETHASONE 2 MG PO TABS
2.0000 mg | ORAL_TABLET | Freq: Two times a day (BID) | ORAL | Status: AC
  Administered 2020-10-01 – 2020-10-02 (×2): 2 mg via ORAL
  Filled 2020-10-01 (×2): qty 1

## 2020-10-01 MED ORDER — PROCHLORPERAZINE MALEATE 5 MG PO TABS: 5.0000 mg | ORAL_TABLET | Freq: Four times a day (QID) | ORAL | Status: DC | PRN

## 2020-10-01 MED ORDER — HEPARIN SODIUM (PORCINE) 5000 UNIT/ML IJ SOLN
5000.0000 [IU] | Freq: Three times a day (TID) | INTRAMUSCULAR | Status: DC
  Administered 2020-10-01 – 2020-10-16 (×44): 5000 [IU] via SUBCUTANEOUS
  Filled 2020-10-01 (×44): qty 1

## 2020-10-01 MED ORDER — PROCHLORPERAZINE EDISYLATE 10 MG/2ML IJ SOLN: 5.0000 mg | Freq: Four times a day (QID) | INTRAMUSCULAR | Status: DC | PRN

## 2020-10-01 MED ORDER — ACETAMINOPHEN 325 MG PO TABS
325.0000 mg | ORAL_TABLET | ORAL | Status: DC | PRN
  Filled 2020-10-01: qty 2

## 2020-10-01 MED ORDER — DIPHENHYDRAMINE HCL 12.5 MG/5ML PO ELIX: 12.5000 mg | ORAL_SOLUTION | Freq: Four times a day (QID) | ORAL | Status: DC | PRN

## 2020-10-01 MED ORDER — DOCUSATE SODIUM 100 MG PO CAPS
100.0000 mg | ORAL_CAPSULE | Freq: Two times a day (BID) | ORAL | Status: DC
  Administered 2020-10-01 – 2020-10-22 (×42): 100 mg via ORAL
  Filled 2020-10-01 (×42): qty 1

## 2020-10-01 MED ORDER — ALUM & MAG HYDROXIDE-SIMETH 200-200-20 MG/5ML PO SUSP: 30.0000 mL | ORAL | Status: DC | PRN

## 2020-10-01 MED ORDER — BISACODYL 10 MG RE SUPP: 10.0000 mg | Freq: Every day | RECTAL | Status: DC | PRN

## 2020-10-01 MED ORDER — ESCITALOPRAM OXALATE 10 MG PO TABS
10.0000 mg | ORAL_TABLET | Freq: Every day | ORAL | Status: DC
  Administered 2020-10-02 – 2020-10-22 (×21): 10 mg via ORAL
  Filled 2020-10-01 (×21): qty 1

## 2020-10-01 MED ORDER — TRAZODONE HCL 50 MG PO TABS
25.0000 mg | ORAL_TABLET | Freq: Every evening | ORAL | Status: DC | PRN
  Filled 2020-10-01: qty 1

## 2020-10-01 MED ORDER — FLEET ENEMA 7-19 GM/118ML RE ENEM: 1.0000 | ENEMA | Freq: Once | RECTAL | Status: DC | PRN

## 2020-10-01 MED ORDER — PANTOPRAZOLE SODIUM 40 MG PO TBEC
40.0000 mg | DELAYED_RELEASE_TABLET | Freq: Every day | ORAL | Status: DC
  Administered 2020-10-02 – 2020-10-22 (×21): 40 mg via ORAL
  Filled 2020-10-01 (×21): qty 1

## 2020-10-01 MED ORDER — BACITRACIN ZINC 500 UNIT/GM EX OINT
1.0000 "application " | TOPICAL_OINTMENT | Freq: Two times a day (BID) | CUTANEOUS | Status: DC
  Administered 2020-10-01 – 2020-10-14 (×26): 1 via TOPICAL
  Filled 2020-10-01 (×7): qty 28.35

## 2020-10-01 MED ORDER — DEXAMETHASONE 2 MG PO TABS
1.0000 mg | ORAL_TABLET | Freq: Every day | ORAL | Status: AC
  Administered 2020-10-05 – 2020-10-06 (×2): 1 mg via ORAL
  Filled 2020-10-01 (×2): qty 1

## 2020-10-01 MED ORDER — GUAIFENESIN-DM 100-10 MG/5ML PO SYRP: 5.0000 mL | ORAL_SOLUTION | Freq: Four times a day (QID) | ORAL | Status: DC | PRN

## 2020-10-01 MED ORDER — POLYETHYLENE GLYCOL 3350 17 G PO PACK: 17.0000 g | PACK | Freq: Every day | ORAL | Status: DC | PRN

## 2020-10-01 MED ORDER — ASPIRIN EC 81 MG PO TBEC
81.0000 mg | DELAYED_RELEASE_TABLET | Freq: Every day | ORAL | Status: DC
  Administered 2020-10-02 – 2020-10-22 (×21): 81 mg via ORAL
  Filled 2020-10-01 (×21): qty 1

## 2020-10-01 MED ORDER — LEVETIRACETAM 750 MG PO TABS
750.0000 mg | ORAL_TABLET | Freq: Two times a day (BID) | ORAL | Status: DC
  Administered 2020-10-01 – 2020-10-22 (×42): 750 mg via ORAL
  Filled 2020-10-01 (×42): qty 1

## 2020-10-01 MED ORDER — HYDROCODONE-ACETAMINOPHEN 5-325 MG PO TABS
1.0000 | ORAL_TABLET | ORAL | Status: DC | PRN
  Filled 2020-10-01: qty 1

## 2020-10-01 MED ORDER — ASPIRIN EC 81 MG PO TBEC: 81.0000 mg | DELAYED_RELEASE_TABLET | Freq: Every day | ORAL | Status: DC

## 2020-10-01 MED ORDER — ATORVASTATIN CALCIUM 10 MG PO TABS
10.0000 mg | ORAL_TABLET | Freq: Every day | ORAL | Status: DC
  Administered 2020-10-02 – 2020-10-22 (×21): 10 mg via ORAL
  Filled 2020-10-01 (×21): qty 1

## 2020-10-01 NOTE — Progress Notes (Signed)
Orthopedic Tech Progress Note Patient Details:  Greycen Felter Nov 21, 1950 [id number]  Called in order to [employer] for a PRAFO  Patient ID: Joahan Swatzell, male   DOB: August 27, 1950, 70 y.o.   MRN: [id number]  Janit Pagan 10/01/2020, 5:15 PM

## 2020-10-01 NOTE — Progress Notes (Signed)
PMR Admission Coordinator Pre-Admission Assessment   Patient: Brent Harrington is an 70 y.o., male MRN: [id number] DOB: 1950/12/13 Height: 5' 11" (1.803 m) Weight: 95.3 kg   Insurance Information HMO:     PPO: yes     PCP:      IPA:      80/20:      OTHER: PRIMARY: A[employer] Medicare      Policy#: [id number]      Subscriber: Pt.  CM Name: Ubaldo Glassing      Phone#: [phone]     Fax#: [phone] Pre-Cert#: [id number].  Auth for CIR given by Lattie Haw  with [employer] on 09/29/20, for 5 days Medicare with updates due to [facility]  at faxed listed above on 10/03/20      Employer: none Benefits:  Phone #:opened via [email or web address] EEff Date: 03/22/2020 - still active Deductible: does not have one OOP Max: $5,900 ($460 met)  CIR: $375/day co-pay with a max co-pay of $1,875/admission (5 days) SNF: $0/day co-pay for days 1-20, $188/day co-pay for days 21-100; limited to 100 days/cal yr. Outpatient:  $35/visit co-pay Home Health:  100% coverage DME: 80% coverage; 20% co-insurance   SECONDARY: none      Policy#:       Phone#:   Development worker, community:       Phone#:   The Engineer, petroleum" for patients in Inpatient Rehabilitation Facilities with attached "Privacy Act [place] Records" was provided and verbally reviewed with: Patient   Emergency Contact Information Contact Information       Name Relation Home Work Mobile    [name], [name] Spouse [phone]               Current Medical History  Patient Admitting Diagnosis: Meningioma s/p crani History of Present Illness: Patient is a 70 y.o. 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  There was significant associated vasogenic edema in the brain.  On exam, he had mild left leg weakness.   Pt underwent R craniotomy for resection of falcine meningioma on 09/27/20.  PT. With some residual leg weakness, so CIR was consulted to assist in return to PLOF   Patient's medical record from [facility] has been reviewed by the rehabilitation admission coordinator and physician.   Past Medical History      Past Medical History:  Diagnosis Date   Anxiety     Cancer [facility]) 2012    Colon Cancer      Family History   family history includes Diabetes in his sister; Heart disease in his father; Hypertension in his mother.   Prior Rehab/Hospitalizations Has the patient had prior rehab or hospitalizations prior to admission? No   Has the patient had major surgery during 100 days prior to admission? Yes              Current Medications   Current Facility-Administered Medications:   0.9 % NaCl with KCl 20 mEq/ L  infusion, , Intravenous, Continuous, Eustace Moore, MD, Stopped at 09/25/20 1452   acetaminophen (TYLENOL) tablet 650 mg, 650 mg, Oral, Q4H PRN, 650 mg at 09/25/20 0534 **OR** acetaminophen (TYLENOL) suppository 650 mg, 650 mg, Rectal, Q4H PRN, Vallarie Mare, MD   atorvastatin (LIPITOR) tablet 10 mg, 10 mg, Oral, Daily, Vallarie Mare, MD, 10 mg at 09/28/20 1004  Chlorhexidine Gluconate Cloth 2 % PADS 6 each, 6 each, Topical, Daily, Vallarie Mare, MD, 6 each at 09/27/20 1340   [COMPLETED] dexamethasone (DECADRON) tablet 4 mg, 4 mg, Oral, Q6H, 4 mg at 09/26/20 1148 **FOLLOWED BY** dexamethasone (DECADRON) tablet 4 mg, 4 mg, Oral, Q8H, 4 mg at 09/28/20 0538 **FOLLOWED BY** dexamethasone (DECADRON) tablet 2 mg, 2 mg, Oral, Q8H **FOLLOWED BY** [START ON 09/30/2020] dexamethasone (DECADRON) tablet 2 mg, 2 mg, Oral, Q12H **FOLLOWED BY** [START ON 10/02/2020] dexamethasone (DECADRON) tablet 1 mg, 1 mg, Oral, Q12H **FOLLOWED BY** [START ON 10/05/2020] dexamethasone (DECADRON) tablet 1 mg, 1 mg, Oral, Daily, Vallarie Mare, MD   docusate sodium (COLACE) capsule 100 mg, 100 mg, Oral, BID, Vallarie Mare, MD, 100 mg at 09/28/20 1004    enalaprilat (VASOTEC) injection 1.25 mg, 1.25 mg, Intravenous, Q6H PRN, Vallarie Mare, MD   escitalopram (LEXAPRO) tablet 10 mg, 10 mg, Oral, Daily, Vallarie Mare, MD, 10 mg at 09/28/20 1003   heparin injection 5,000 Units, 5,000 Units, Subcutaneous, Q8H, Vallarie Mare, MD, 5,000 Units at 09/28/20 0538   HYDROcodone-acetaminophen (NORCO/VICODIN) 5-325 MG per tablet 1 tablet, 1 tablet, Oral, Q4H PRN, Vallarie Mare, MD, 1 tablet at 09/24/20 1747   labetalol (NORMODYNE) injection 10 mg, 10 mg, Intravenous, Q10 min PRN, Vallarie Mare, MD   levETIRAcetam (KEPPRA) tablet 750 mg, 750 mg, Oral, BID, Vallarie Mare, MD, 750 mg at 09/28/20 1003   morphine 2 MG/ML injection 1-2 mg, 1-2 mg, Intravenous, Q2H PRN, Vallarie Mare, MD   ondansetron [facility]) tablet 4 mg, 4 mg, Oral, Q4H PRN **OR** ondansetron (ZOFRAN) injection 4 mg, 4 mg, Intravenous, Q4H PRN, Vallarie Mare, MD   pantoprazole (PROTONIX) EC tablet 40 mg, 40 mg, Oral, Daily, Vallarie Mare, MD, 40 mg at 09/28/20 1004   polyethylene glycol (MIRALAX / GLYCOLAX) packet 17 g, 17 g, Oral, Daily PRN, Vallarie Mare, MD   promethazine (PHENERGAN) tablet 12.5-25 mg, 12.5-25 mg, Oral, Q4H PRN, Vallarie Mare, MD   sodium phosphate (FLEET) 7-19 GM/118ML enema 1 enema, 1 enema, Rectal, Once PRN, Vallarie Mare, MD   Diet Order                  Diet regular Room service appropriate? Yes with Assist; Fluid consistency: Thin  Diet effective now                         Precautions / Restrictions Precautions Precautions: Fall Restrictions Weight Bearing Restrictions: No    Has the patient had 2 or more falls or a fall with injury in the past year? No   Prior Activity Level Limited Community (1-2x/wk): Pt. was active and working PTA   Prior Functional Level Self Care: Did the patient need help bathing, dressing, using the toilet or eating? Independent   Indoor Mobility: Did the patient need  assistance with walking from room to room (with or without device)? Independent   Stairs: Did the patient need assistance with internal or external stairs (with or without device)? Independent   Functional Cognition: Did the patient need help planning regular tasks such as shopping or remembering to take medications? Independent   Home Assistive Devices / Equipment   Prior Device Use: Indicate devices/aids used by the patient prior to current illness, exacerbation or injury? None of the above   Current Functional Level Cognition   Overall Cognitive Status: Impaired/Different from baseline Orientation Level: Oriented  X4 Safety/Judgement: Decreased awareness of safety General Comments: VC for problem solving and safety, pt needing frequent redirection and max cues for slowed movements, safety. pt tangential with decreased safety awareness.    Extremity Assessment (includes Sensation/Coordination)   Upper Extremity Assessment: LUE deficits/detail RUE Coordination: decreased fine motor, decreased gross motor LUE Deficits / Details: functional grip, wrist and forearm movements, 3/5 tricep (bicep as well with increased time), 1/5 at shoulder LUE Sensation: decreased light touch LUE Coordination: decreased fine motor, decreased gross motor  Lower Extremity Assessment: RLE deficits/detail, LLE deficits/detail RLE Deficits / Details: grossly 4/5, but needing a few tries with each command to execute movements appropriately. pt stating this may be due to arthritis, question coordination. RLE Sensation: WNL RLE Coordination: decreased fine motor, decreased gross motor LLE Deficits / Details: no active movements to command, pt with significant extensor hypertonicity at rest as well as with attempts at PROM from therapist. unable to attain neutral at L ankle (lacking ~10 degrees). LLE Sensation: decreased light touch LLE Coordination: decreased fine motor, decreased gross motor     ADLs   Overall  ADL's : Needs assistance/impaired Eating/Feeding: Set up, Bed level Eating/Feeding Details (indicate cue type and reason): or supported sitting in recliner Grooming: Minimal assistance Grooming Details (indicate cue type and reason): supported sitting Upper Body Bathing: Minimal assistance Upper Body Bathing Details (indicate cue type and reason): supported sitting Lower Body Bathing: Maximal assistance Lower Body Bathing Details (indicate cue type and reason): +2 Mod A sit<>stand Upper Body Dressing : Moderate assistance Upper Body Dressing Details (indicate cue type and reason): supported sitting Lower Body Dressing: Total assistance Lower Body Dressing Details (indicate cue type and reason): +2 Mod A sit<>stand Toilet Transfer: Moderate assistance, +2 for physical assistance, +2 for safety/equipment Toilet Transfer Details (indicate cue type and reason): use of sara stedy Toileting- Clothing Manipulation and Hygiene: Total assistance Toileting - Clothing Manipulation Details (indicate cue type and reason): +2 Mod A sit<>stand     Mobility   Overal bed mobility: Needs Assistance Bed Mobility: Supine to Sit Rolling: Mod assist Sidelying to sit: Mod assist Supine to sit: Mod assist, +2 for physical assistance Sit to supine: Max assist, +2 for safety/equipment General bed mobility comments: pt OOB in recliner at start and end of session     Transfers   Overall transfer level: Needs assistance Equipment used: 2 person hand held assist Transfer via Lift Equipment: Stedy Transfers: Sit to/from Stand Sit to Stand: Mod assist, +2 physical assistance Squat pivot transfers: Total assist  Lateral/Scoot Transfers: Total assist General transfer comment: sit-stand x8 from recliner. max cues for technique, posture, positioning, midline. pt able to maintain without hyperextension in LLE. addition of partial sit-stand x4, max cues for hip extension and trunk to midline (R lean)     Ambulation /  Gait / Stairs / Wheelchair Mobility   Ambulation/Gait General Gait Details: pt unable at this time     Posture / Balance Dynamic Sitting Balance Sitting balance - Comments: reliant on UE support or minA Balance Overall balance assessment: Needs assistance Sitting-balance support: Feet supported Sitting balance-Leahy Scale: Poor Sitting balance - Comments: reliant on UE support or minA Postural control: Left lateral lean Standing balance support: Bilateral upper extremity supported Standing balance-Leahy Scale: Poor Standing balance comment: lean to left, followed all cues to correct posture but unable to maintain, LLE hyper extends     Special needs/care consideration Skin R crani incision    Previous Home Environment (from acute therapy  documentation) Living Arrangements: Spouse/significant other, Children  Lives With: Family Available Help at Discharge: Family, Available 24 hours/day Type of Home: House Home Layout: Multi-level, Able to live on main level with bedroom/bathroom Home Access: Stairs to enter Entrance Stairs-Rails: None Entrance Stairs-Number of Steps: 2 Bathroom Shower/Tub: Government social research officer Accessibility: Yes How Accessible: Accessible via walker   Discharge Living Setting Plans for Discharge Living Setting: Patient's home Type of Home at Discharge: House Discharge Home Layout: Multi-level, Able to live on main level with bedroom/bathroom Discharge Home Access: Stairs to enter Entrance Stairs-Rails: None Entrance Stairs-Number of Steps: 2 Discharge Bathroom Shower/Tub: Tub/shower unit Discharge Bathroom Toilet: Standard Discharge Bathroom Accessibility: Yes How Accessible: Accessible via walker   Social/Family/Support Systems Patient Roles: Spouse Contact Information: [phone] Anticipated Caregiver: Caryl Bis (sister) is retired Corporate treasurer and can stay with pt. While kids work. Pt. Is separated from his wife.   Anticipated Caregiver's Contact Information: [phone].  Ability/Limitations of Caregiver: Can provide Min A Caregiver Availability: 24/7 Discharge Plan Discussed with Primary Caregiver: Yes Is Caregiver In Agreement with Plan?: Yes   Goals Patient/Family Goal for Rehab: PT/OT/SLP min A Expected length of stay: 18-21 days Pt/Family Agrees to Admission and willing to participate: Yes Program Orientation Provided & Reviewed with Pt/Caregiver Including Roles  & Responsibilities: Yes   Decrease burden of Care through IP rehab admission: Specialzed equipment needs, Decrease number of caregivers, Bowel and bladder program, and Patient/family education   Possible need for SNF placement upon discharge: not anticipated    Patient Condition: I have reviewed medical records from [facility], spoken with CM, and patient and spouse. I met with patient at the bedside and discussed via phone for inpatient rehabilitation assessment.  Patient will benefit from ongoing PT, OT, and SLP, can actively participate in 3 hours of therapy a day 5 days of the week, and can make measurable gains during the admission.  Patient will also benefit from the coordinated team approach during an Inpatient Acute Rehabilitation admission.  The patient will receive intensive therapy as well as Rehabilitation physician, nursing, social worker, and care management interventions.  Due to safety, skin/wound care, disease management, medication administration, pain management, and patient education the patient requires 24 hour a day rehabilitation nursing.  The patient is currently mod+2 with mobility and basic ADLs.  Discharge setting and therapy post discharge at home with home health is anticipated.  Patient has agreed to participate in the Acute Inpatient Rehabilitation Program and will admit today.   Preadmission Screen Completed By:  Genella Mech, 09/28/2020 11:43  AM ______________________________________________________________________   Discussed status with Dr. Letta Pate  on 10/01/20 at 40 and received approval for admission today.   Admission Coordinator:  Genella Mech, CCC-SLP, time 1000/Date 10/01/20    Assessment/Plan: Diagnosis:Meningioma, frontal s/p resection Does the need for close, 24 hr/day Medical supervision in concert with the patient's rehab needs make it unreasonable for this patient to be served in a less intensive setting? Yes Co-Morbidities requiring supervision/potential complications: Neurologic gait disorder, seizure disorder, new onset, post op pain, post op wound care, monitoring Due to bladder management, bowel management, safety, skin/wound care, disease management, medication administration, pain management, and patient education, does the patient require 24 hr/day rehab nursing? Yes Does the patient require coordinated care of a physician, rehab nurse, PT, OT, and SLP to address physical and functional deficits in the context of the above medical diagnosis(es)? Yes Addressing deficits in the following areas: balance, endurance, locomotion, strength,  transferring, bowel/bladder control, bathing, dressing, toileting, cognition, and psychosocial support Can the patient actively participate in an intensive therapy program of at least 3 hrs of therapy 5 days a week? Yes The potential for patient to make measurable gains while on inpatient rehab is good Anticipated functional outcomes upon discharge from inpatient rehab: modified independent and supervision PT, modified independent and supervision OT, modified independent and supervision SLP Estimated rehab length of stay to reach the above functional goals is: 18-21d Anticipated discharge destination: Home 10. Overall Rehab/Functional Prognosis: good     MD Signature: Charlett Blake M.D. [facility] Fellow Am Acad of Phys Med and Rehab Diplomate Am Board of  Electrodiagnostic Med Fellow Am Board of Interventional Pain

## 2020-10-01 NOTE — Progress Notes (Signed)
Physical Therapy Treatment Patient Details Name: Brent Harrington MRN: [id number] DOB: 07/22/1950 Today's Date: 10/01/2020    History of Present Illness The pt is a 70 yo male presenting s/p R crani for resection of falcine meningioma on 7/6. PMH includes: anxiety, colon cancer.    PT Comments    Focused session on improving mobility through w/c training and pre-gait training. Pt with decreased L UE strength, coordination, and proprioception impacting his ability to place bil UEs equally on w/c rims and push with symmetrical force to maintain a straight path. Pt continues to display minimal to no muscle activation in his L lower extremity (trace quads and hip flexor activation noted with gait training), placing him at risk for falls and injuries. Pt with improved bil weight shifting, allowing for lifting of either leg with assistance, as session progressed. Educated pt on continuing to place weight through L leg while sitting, monitoring L ankle placement for safety, visualizing L leg movement, and trying to move bil lower extremities simultaneously to encourage muscle activation. Will continue to follow acutely. Current recommendations remain appropriate.   Follow Up Recommendations  CIR     Equipment Recommendations  Wheelchair (measurements PT);Wheelchair cushion (measurements PT);3in1 (PT)    Recommendations for Other Services       Precautions / Restrictions Precautions Precautions: Fall Precaution Comments: left hemi Restrictions Weight Bearing Restrictions: No    Mobility  Bed Mobility               General bed mobility comments: pt OOB in recliner at start and end of session    Transfers Overall transfer level: Needs assistance Equipment used: 2 person hand held assist Transfers: Sit to/from [employer] Transfers Sit to Stand: Mod assist;+2 physical assistance;+2 safety/equipment Stand pivot transfers: Max assist;+2 physical assistance;+2 safety/equipment        General transfer comment: Sit to stand 1x from recliner and 2x from w/c, cuing pt to push up or reach back sit <> stand with bil UEs. Cues provided for feet placement, needing L knee and ankle block to prevent inversion and buckling. ModAx2 to power up to stand to HHA and maxAx2 to stand pivot to R 2x. Cues provided during pivot to reach R UE for target surface and lift L leg/drag it towards him, needing assistance to do so.  Ambulation/Gait             General Gait Details: Pre-gait training performed, cuing pt x2 bouts to weight shift anteriorly onto L foot <> posteriorly onto R foot first bout and laterally onto either foot while lifting contralateral foot second bout, maxAx2. Intermittent trace quads and hip flexor activation noted on L leg, primarily compensating through hip movement and weight shifting. L knee and ankle block to prevent hyperextension, buckling, and inversion injury. Bil HHA.   Stairs             Wheelchair Mobility    Modified Rankin (Stroke Patients Only) Modified Rankin (Stroke Patients Only) Pre-Morbid Rankin Score: No symptoms Modified Rankin: Severe disability     Balance Overall balance assessment: Needs assistance Sitting-balance support: Feet supported;No upper extremity supported Sitting balance-Leahy Scale: Good Sitting balance - Comments: Able to lean anteriorly without back support without LOB.   Standing balance support: Bilateral upper extremity supported Standing balance-Leahy Scale: Poor Standing balance comment: Pt able to stand for several minutes at a time with bil HHA and mod-maxAx2.  Cognition Arousal/Alertness: Awake/alert Behavior During Therapy: Impulsive Overall Cognitive Status: Impaired/Different from baseline Area of Impairment: Safety/judgement;Awareness                         Safety/Judgement: Decreased awareness of safety;Decreased awareness of  deficits Awareness: Emergent   General Comments: Pt moves quickly, able to verbalize deficits, but remains impulsive, and unsafe when attempting to move. Needs cues to wait for therapist.      Exercises Other Exercises Other Exercises: Propelling manual w/c with bil UEs, 2x ~60 ft bouts with min guard-minA, encouraging L UE strength, coordination, and proprioception    General Comments General comments (skin integrity, edema, etc.): Educated pt to visualize moving L leg, move bil legs simultaneously to encourage crossover, and lean weight through hands onto L leg while sitting to encourage weight bearing; Pt propelling manual w/c with L leg rest with bil UEs, x2 bouts of ~60 ft with min guard-minA. Cues provided to look at L hand placement and match R to it to ensure equal pushing to keep straight path, but continues to push more with R.      Pertinent Vitals/Pain Pain Assessment: Faces Faces Pain Scale: No hurt Pain Intervention(s): Monitored during session    Home Living                      Prior Function            PT Goals (current goals can now be found in the care plan section) Acute Rehab PT Goals Patient Stated Goal: to go to CIR PT Goal Formulation: With patient Time For Goal Achievement: 10/09/20 Potential to Achieve Goals: Good Progress towards PT goals: Progressing toward goals    Frequency    Min 4X/week      PT Plan Current plan remains appropriate    Co-evaluation              AM-PAC PT "6 Clicks" Mobility   Outcome Measure  Help needed turning from your back to your side while in a flat bed without using bedrails?: A Lot Help needed moving from lying on your back to sitting on the side of a flat bed without using bedrails?: A Lot Help needed moving to and from a bed to a chair (including a wheelchair)?: A Lot Help needed standing up from a chair using your arms (e.g., wheelchair or bedside chair)?: A Lot Help needed to walk in  hospital room?: Total Help needed climbing 3-5 steps with a railing? : Total 6 Click Score: 10    End of Session Equipment Utilized During Treatment: Gait belt Activity Tolerance: Patient tolerated treatment well Patient left: in chair;with call bell/phone within reach;with chair alarm set Nurse Communication: Mobility status PT Visit Diagnosis: Other abnormalities of gait and mobility (R26.89);Hemiplegia and hemiparesis;Unsteadiness on feet (R26.81);Muscle weakness (generalized) (M62.81);Difficulty in walking, not elsewhere classified (R26.2);Other symptoms and signs involving the nervous system (R29.898) Hemiplegia - Right/Left: Left Hemiplegia - dominant/non-dominant: Non-dominant Hemiplegia - caused by:  (tumor resection)     Time: 2202-5427 PT Time Calculation (min) (ACUTE ONLY): 34 min  Charges:  $Gait Training: 8-22 mins $Therapeutic Activity: 8-22 mins                     Moishe Spice, PT, DPT Acute Rehabilitation Services  Pager: [phone] Office: [place] 10/01/2020, 9:20 AM

## 2020-10-01 NOTE — Progress Notes (Signed)
Inpatient Rehab Admissions Coordinator:   I have a bed for this Pt. On CIR and will plan to transfer Pt. Today. RN may call report to [phone].  Clemens Catholic, [place], [place] Admissions Coordinator  [phone] ([place]) [phone] (office)

## 2020-10-01 NOTE — Progress Notes (Signed)
Inpatient Rehabilitation Medication Review by a Pharmacist  A complete drug regimen review was completed for this patient to identify any potential clinically significant medication issues.  Clinically significant medication issues were identified:  no  Check AMION for pharmacist assigned to patient if future medication questions/issues arise during this admission.  Pharmacist comments:   Time spent performing this drug regimen review (minutes):  5 minutes   [place] Gwendelyn Lanting 10/01/2020 8:17 PM

## 2020-10-01 NOTE — H&P (Signed)
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 Marland Kitchen  He was admitted on 09/24/2020 for right craniotomy and resection of meningioma by Dr. Marcello Moores.  Postop has had left upper greater than left lower extremity weakness with numbness.  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  He was started on Decadron for edema control and has had improvement in LUE strength. He continues to be limited by LLE weakness with decreased hemiplegia as well as impulsivity with poor safety awareness. CIR recommended due to functional decline.     Review of Systems Constitutional:  Negative for chills and fever. HENT:  Negative for hearing loss and tinnitus.   Eyes:  Negative for blurred vision and double vision. Respiratory:  Negative for cough and shortness of breath.   Cardiovascular:  Negative for chest pain and palpitations. Gastrointestinal:  Negative for constipation, heartburn and nausea. Genitourinary:  Negative for dysuria and urgency. Musculoskeletal:  Negative for back pain and myalgias. Skin:  Negative for rash. Neurological:  Positive for sensory change and weakness. Negative for dizziness and headaches. Psychiatric/Behavioral:  The patient is not nervous/anxious and does not have insomnia.           Past Medical History:  Diagnosis Date   Anxiety     Cancer [facility]) 2012    Colon Cancer            Past Surgical History:  Procedure Laterality Date   APPLICATION OF CRANIAL NAVIGATION Right 09/24/2020    Procedure: APPLICATION OF CRANIAL NAVIGATION;  Surgeon: Vallarie Mare, MD;  Location: [place];  Service: Neurosurgery;  Laterality: Right;   COLON SURGERY   2012   CRANIOTOMY Right 09/24/2020    Procedure: RIGHT CRANIOTOMY FOR RESECTION OF FALCINE MENINGIOMA;  Surgeon: Vallarie Mare, MD;  Location: [place];  Service: Neurosurgery;  Laterality: Right;           Family History  Problem Relation Age of Onset   Hypertension Mother     Heart disease Father     Diabetes Sister        Social History: Married. 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 He  reports that he has never smoked. He has never used smokeless tobacco. He reports previous alcohol use. He reports that he does not use drugs.     Allergies: No Known Allergies           Medications Prior to Admission  Medication Sig Dispense Refill   atorvastatin (LIPITOR) 10 MG tablet Take 10 mg by mouth daily.       escitalopram (LEXAPRO) 10 MG tablet Take 10 mg by mouth daily.       levETIRAcetam (KEPPRA) 750 MG tablet Take 750 mg by mouth 2 (two) times daily.       Sodium Chloride-Sodium Bicarb (AYR SALINE NASAL RINSE NA) Place 1 application into the nose daily.  aspirin EC 81 MG tablet Take 81 mg by mouth daily. Swallow whole. (Patient not taking: No sig reported)          Drug Regimen Review  Drug regimen was reviewed and remains appropriate with no significant issues identified   Home: Home Living Family/patient expects to be discharged to:: Inpatient rehab Living Arrangements: Spouse/significant other, Children Available Help at Discharge: Family, Available 24 hours/day Type of Home: House Home Access: Stairs to enter [employer] of Steps: 2 Entrance Stairs-Rails: None Home Layout: Multi-level, Able to live on main level with bedroom/bathroom Bathroom  Shower/Tub: Government social research officer Accessibility: Yes  Lives With: Family   Functional History: Prior Function Level of Independence: Independent Comments: independent, still working part time as a Sport and exercise psychologist Status:  Mobility: Bed Mobility Overal bed mobility: Needs Assistance Bed Mobility: Supine to Sit Rolling: Mod assist Sidelying to sit: Mod assist Supine to sit: Mod assist, +2 for physical assistance Sit to supine: Max assist, +2 for safety/equipment General bed mobility comments: pt OOB in recliner at start and end of session Transfers Overall transfer level: Needs assistance Equipment used: 2 person hand held assist Transfer via Lift Equipment: Stedy Transfers: Sit to/from Stand, [employer] Transfers Sit to Stand: Mod assist, +2 physical assistance, +2 safety/equipment Stand pivot transfers: Max assist, +2 physical assistance, +2 safety/equipment Squat pivot transfers: Total assist  Lateral/Scoot Transfers: Total assist General transfer comment: Sit to stand 1x from recliner and 2x from w/c, cuing pt to push up or reach back sit <> stand with bil UEs. Cues provided for feet placement, needing L knee and ankle block to prevent inversion and buckling. ModAx2 to power up to stand to HHA and maxAx2 to stand pivot to R 2x. Cues provided during pivot to reach R UE for target surface and lift L leg/drag it towards him, needing assistance to do so. Ambulation/Gait General Gait Details: Pre-gait training performed, cuing pt x2 bouts to weight shift anteriorly onto L foot <> posteriorly onto R foot first bout and laterally onto either foot while lifting contralateral foot second bout, maxAx2. Intermittent trace quads and hip flexor activation noted on L leg, primarily compensating through hip movement and weight shifting. L knee and ankle block to prevent hyperextension, buckling, and inversion injury. Bil HHA.   ADL: ADL Overall ADL's : Needs  assistance/impaired Eating/Feeding: Set up, Bed level Eating/Feeding Details (indicate cue type and reason): or supported sitting in recliner Grooming: Min guard, Standing Grooming Details (indicate cue type and reason): supported sitting and standingx 2 mins in stedy to work on balance at sink Upper Body Bathing: Minimal assistance Upper Body Bathing Details (indicate cue type and reason): supported sitting Lower Body Bathing: Maximal assistance Lower Body Bathing Details (indicate cue type and reason): +2 Mod A sit<>stand Upper Body Dressing : Moderate assistance Upper Body Dressing Details (indicate cue type and reason): supported sitting Lower Body Dressing: Moderate assistance, Cueing for safety, Cueing for sequencing, Sitting/lateral leans Lower Body Dressing Details (indicate cue type and reason): donning socks with figure 4; assist for keeping LLE up on RUE for task Toilet Transfer: Moderate assistance, Cueing for safety, Cueing for sequencing Toilet Transfer Details (indicate cue type and reason): with stedy modA Toileting- Clothing Manipulation and Hygiene: Maximal assistance, Sit to/from stand Toileting - Clothing Manipulation Details (indicate cue type and reason): unsafe in standing due to instability Functional mobility during ADLs: Moderate assistance, Cueing for sequencing, Cueing for safety General ADL Comments: Pt limited by  decreased strength, decreased safety awareness, decreased command follow and decreased sensation on LLE for transfers. pt utilizing stedy with increased confidence.   Cognition: Cognition Overall Cognitive Status: Impaired/Different from baseline Orientation Level: Oriented X4 Cognition Arousal/Alertness: Awake/alert Behavior During Therapy: Impulsive Overall Cognitive Status: Impaired/Different from baseline Area of Impairment: Safety/judgement, Awareness Safety/Judgement: Decreased awareness of safety, Decreased awareness of deficits Awareness:  Emergent Problem Solving: Decreased initiation, Requires verbal cues General Comments: Pt moves quickly, able to verbalize deficits, but remains impulsive, and unsafe when attempting to move. Needs cues to wait for therapist.     Blood pressure 110/86, pulse 71, temperature 98.4 F (36.9 C), resp. rate 16, height 5\' 11"  (1.803 m), weight 95.3 kg, SpO2 93 %. Physical Exam Vitals and nursing note reviewed. Constitutional:      Appearance: Normal appearance. HENT:    Head: Contusion present.    Comments: Crani incision C/D/I with staples in place Neurological:    Mental Status: He is alert and oriented to person, place, and time.    Comments: Speech clear. Able to follow basic commands without difficulty. Monoplegia LLE and mild weakness  LUE.     General: No acute distress Mood and affect are appropriate Heart: Regular rate and rhythm no rubs murmurs or extra sounds Lungs: Clear to auscultation, breathing unlabored, no rales or wheezes Abdomen: Positive bowel sounds, soft nontender to palpation, nondistended Extremities: No clubbing, cyanosis, or edema Skin: No evidence of breakdown, no evidence of rash Neurologic: Cranial nerves II through XII intact, motor strength is 5/5 in RIght deltoid, bicep, tricep, grip, hip flexor, knee extensors, ankle dorsiflexor and plantar flexor 4/5 LUE 0/5 LLE Sensory exam normal sensation to light touch in bilateral upper and lower extremities Cerebellar exam normal finger to nose to finger  in bilateral upper  Musculoskeletal: Full range of motion in all 4 extremities. No joint swelling    Lab Results Last 48 Hours  No results found for this or any previous visit (from the past 48 hour(s)).   Imaging Results (Last 48 hours)  No results found.           Medical Problem List and Plan: 1.  decreased mobility and ADLs secondary to RIght frontal meningioma              -patient may not shower             -ELOS/Goals: 10-14d 2.   Antithrombotics: -DVT/anticoagulation:  Pharmaceutical: Heparin             -antiplatelet therapy: N/A 3. Pain Management: Tylenol as needed 4. Mood: LCSW to follow for evaluation and support             -antipsychotic agents: N/AA 5. Neuropsych: This patient is capable of making decisions on his own behalf. 6. Skin/Wound Care: Routine pressure-relief measures. 7. Fluids/Electrolytes/Nutrition: Monitor intake/output.  Check CMET in a.m. 8.  Cerebral edema: Completes Decadron taper tomorrow 9.  Postop labs: Will check follow-up labs in AM. 10.  History of depression: Continue Lexapro 11.  Left focal motor seizure w/aura: Reports that he still continues to have seizures every 2-3 weeks/last a couple of days PTA --Question increase Keppra? Currently on 750 mg twice daily        Bary Leriche, PA-C 10/01/2020  "I have personally performed a face to face diagnostic evaluation of this patient.  Additionally, I have reviewed and concur with the physician assistant's documentation above."  Charlett Blake M.D. [facility] Fellow Am Acad  of Phys Med and Rehab Diplomate Am Board of Electrodiagnostic Med Fellow Am Board of Interventional Pain

## 2020-10-01 NOTE — Discharge Summary (Signed)
  Physician Discharge Summary  Patient ID: Tidus Upchurch MRN: [id number] DOB/AGE: 12/07/1950 69 y.o.  Admit date: 09/24/2020 Discharge date: 10/01/2020  Admission Diagnoses:  S/p resection of meningioma  Discharge Diagnoses:  Same Active Problems:   S/P craniotomy   S/P resection of meningioma   Discharged Condition: Stable  Hospital Course:  Andrus Sharp is a 70 y.o. male who had focal motor seizures and left leg weakness related to a large right falcine meningioma.  He underwent craniotomy for resection and postoperatively he was admitted to the ICU.  A postop MRI showed GTR and postsurgical changes.  Postoperatively, he had weakness and difficulty intiating movement in his left leg as expected, as well as somatosensory deficits in his leg.  He had fluctuating tone in the leg.  His LUE had mild proximal weakness that recovered nearly fully after a few days.  He was deemed a good candidate for rehab.  His pain was well-controlled, tolerating a diet, and voiding without issue.  Treatments: Surgery - craniotomy for meningioma resection  Discharge Exam: Blood pressure 110/86, pulse 71, temperature 98.4 F (36.9 C), temperature source Oral, resp. rate 16, height 5\' 11"  (1.803 m), weight 95.3 kg, SpO2 93 %. Awake, alert, oriented Speech fluent, appropriate CN grossly intact 5/5 BUE, 1/5 LLE Wound c/d/i  Disposition: Discharge disposition: 02-Transferred to [facility]          Signed: Vallarie Mare 10/01/2020, 1:15 PM

## 2020-10-01 NOTE — Progress Notes (Signed)
INPATIENT REHABILITATION ADMISSION NOTE   Arrival Method: via bed     Mental Orientation: A&O x4   Assessment: completed   Skin: see assessment   IV'S: n/a   Pain: none reported   Tubes and Drains: n/a   Safety Measures: 3 side rails up, bed alarm engaged   Vital Signs: see flow sheet   Height and Weight: see flow sheet   Rehab Orientation: oriented to unit, rehab process, schedule, bed alarm, and call bell system.   Family: notified    Notes: Patient ID: Brent Harrington, male   DOB: 01/05/1951, 69 y.o.   MRN: [id number]  Met with patient in room and introduced myself and explained my role in his care. Assisted the nurse in the skin assessment and answering questions about rehab. Patient had a verbal understanding about rehab and was glad to be here as he has lots of tone in is left lower extremity. I will continue to monitor patient.  Stacey Jennings, RN3, BSN, CBIS, CRRN, WTA Care Coordinator, Inpatient Rehabilitation Office [phone] Cell [phone]  

## 2020-10-02 DIAGNOSIS — G40909 Epilepsy, unspecified, not intractable, without status epilepticus: Secondary | ICD-10-CM

## 2020-10-02 DIAGNOSIS — R7989 Other specified abnormal findings of blood chemistry: Secondary | ICD-10-CM | POA: Diagnosis not present

## 2020-10-02 DIAGNOSIS — D320 Benign neoplasm of cerebral meninges: Secondary | ICD-10-CM | POA: Diagnosis not present

## 2020-10-02 DIAGNOSIS — G8194 Hemiplegia, unspecified affecting left nondominant side: Secondary | ICD-10-CM

## 2020-10-02 LAB — COMPREHENSIVE METABOLIC PANEL
ALT: 44 U/L (ref 0–44)
AST: 22 U/L (ref 15–41)
Albumin: 2.9 g/dL — ABNORMAL LOW (ref 3.5–5.0)
Alkaline Phosphatase: 77 U/L (ref 38–126)
Anion gap: 5 (ref 5–15)
BUN: 27 mg/dL — ABNORMAL HIGH (ref 8–23)
CO2: 29 mmol/L (ref 22–32)
Calcium: 8.7 mg/dL — ABNORMAL LOW (ref 8.9–10.3)
Chloride: 102 mmol/L (ref 98–111)
Creatinine, Ser: 1.15 mg/dL (ref 0.61–1.24)
GFR, Estimated: >60 mL/min (ref >60)
Glucose, Bld: 102 mg/dL — ABNORMAL HIGH (ref 70–99)
Potassium: 4.4 mmol/L (ref 3.5–5.1)
Sodium: 136 mmol/L (ref 135–145)
Total Bilirubin: 0.9 mg/dL (ref 0.3–1.2)
Total Protein: 5.5 g/dL — ABNORMAL LOW (ref 6.5–8.1)

## 2020-10-02 LAB — CBC WITH DIFFERENTIAL/PLATELET
Abs Immature Granulocytes: 3.16 10*3/uL — ABNORMAL HIGH (ref 0.00–0.07)
Basophils Absolute: 0 10*3/uL (ref 0.0–0.1)
Basophils Relative: 0 %
Eosinophils Absolute: 0.1 10*3/uL (ref 0.0–0.5)
Eosinophils Relative: 1 %
HCT: 45.2 % (ref 39.0–52.0)
Hemoglobin: 14.8 g/dL (ref 13.0–17.0)
Immature Granulocytes: 15 %
Lymphocytes Relative: 14 %
Lymphs Abs: 2.9 10*3/uL (ref 0.7–4.0)
MCH: 28.6 pg (ref 26.0–34.0)
MCHC: 32.7 g/dL (ref 30.0–36.0)
MCV: 87.3 fL (ref 80.0–100.0)
Monocytes Absolute: 1.7 10*3/uL — ABNORMAL HIGH (ref 0.1–1.0)
Monocytes Relative: 8 %
Neutro Abs: 12.9 10*3/uL — ABNORMAL HIGH (ref 1.7–7.7)
Neutrophils Relative %: 62 %
Platelets: 256 10*3/uL (ref 150–400)
RBC: 5.18 MIL/uL (ref 4.22–5.81)
RDW: 12.9 % (ref 11.5–15.5)
WBC: 20.8 10*3/uL — ABNORMAL HIGH (ref 4.0–10.5)
nRBC: 0 % (ref 0.0–0.2)

## 2020-10-02 NOTE — Evaluation (Signed)
Speech Language Pathology Assessment and Plan  Patient Details  Name: Brent Harrington MRN: [id number] Date of Birth: 1950-12-16  SLP Diagnosis: Cognitive Impairments  Rehab Potential: Good ELOS: 18-21 days    Today's Date: 10/02/2020 SLP Individual Time: 0725-0825 SLP Individual Time Calculation (min): 60 min   Hospital Problem: Principal Problem:   Meningioma, cerebral [facility])  Past Medical History:  Past Medical History:  Diagnosis Date   Anxiety    Cancer ([place]) 2012   Colon Cancer   Past Surgical History:  Past Surgical History:  Procedure Laterality Date   APPLICATION OF CRANIAL NAVIGATION Right 09/24/2020   Procedure: APPLICATION OF CRANIAL NAVIGATION;  Surgeon: Vallarie Mare, MD;  Location: [place];  Service: Neurosurgery;  Laterality: Right;   COLON SURGERY  2012   CRANIOTOMY Right 09/24/2020   Procedure: RIGHT CRANIOTOMY FOR RESECTION OF FALCINE MENINGIOMA;  Surgeon: Vallarie Mare, MD;  Location: [place];  Service: Neurosurgery;  Laterality: Right;    Assessment / Plan / Recommendation Clinical Impression Patient is a 70 year old RH male with history of colon cancer, anxiety as well as 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 Marland Kitchen  He was admitted on 09/24/2020 for right craniotomy and resection of meningioma by Dr. Marcello Moores.  Postop has had left upper greater than left lower extremity weakness with numbness.  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  He was started on Decadron for edema control and has had improvement in LUE  strength. He continues to be limited by LLE weakness with decreased hemiplegia as well as impulsivity with poor safety awareness. CIR recommended due to functional decline. Patient admitted 10/01/20.  Patient demonstrates moderate cognitive impairments impacting safety awareness due to impulsivity, selective attention, problem solving and recall of functional information. SLP administered the [employer] Mental Status Examination (SLUMS). Patient scored  25/30 points with a score of 27 or above considered normal. Patient with deficits in working memory throughout assessment. Patient would benefit from skilled SLP intervention to maximize his cognitive functioning and overall functional independence prior to discharge.     Skilled Therapeutic Interventions          Patient administered a cognitive-linguistic evaluation, please see above for details. Upon arrival, patient was verbally frustrated due to not being able to get out of bed into a chair and for having a wet bed due to spilling his urinal. Patient scooted into the recliner (with +2 for safety with SLP) and independently donned a new gown. Patient thanked this clinician and immediately appeared calmer. At end of session, SLP provided education regarding CIR procedures/protocols including use of a chair alarm, patient verbalized understanding and agreement. Patient left in recliner with alarm on and all needs within reach.     SLP Assessment  Patient will need skilled [place] [facility] during CIR admission    Recommendations  Oral Care Recommendations: Oral care BID Recommendations for Other Services: Neuropsych consult Patient destination: Home Follow up Recommendations: Home Health SLP;Outpatient SLP;24 hour supervision/assistance Equipment Recommended: None recommended by SLP    SLP Frequency 3 to 5 out of  7 days   SLP Duration  SLP Intensity  SLP Treatment/Interventions 18-21 days  Minumum of 1-2 x/day,  30 to 90 minutes  Cognitive remediation/compensation;Internal/external aids;Therapeutic Activities;Environmental controls;Cueing hierarchy;Functional tasks;Patient/family education    Pain No/Denies Pain   Prior Functioning Type of Home: House  Lives With: Family Available Help at Discharge: Family;Available 24 hours/day  SLP Evaluation Cognition Overall Cognitive Status: Impaired/Different from baseline Arousal/Alertness: Awake/alert Orientation Level: Oriented X4 Attention: Sustained Sustained Attention: Impaired Sustained Attention Impairment: Functional basic Memory: Impaired Memory Impairment: Decreased short term memory;Decreased recall of new information Immediate Memory Recall: Sock;Blue;Bed Memory Recall Sock: Not able to recall Memory Recall Blue: Without Cue Memory Recall Bed: With Cue Awareness: Impaired Awareness Impairment: Emergent impairment Problem Solving: Impaired Problem Solving Impairment: Functional basic Behaviors: Impulsive Safety/Judgment: Impaired  Comprehension Auditory Comprehension Overall Auditory Comprehension: Appears within functional limits for tasks assessed Visual Recognition/Discrimination Discrimination: Not tested Reading Comprehension Reading Status: Not tested Expression Expression Primary Mode of Expression: Verbal Verbal Expression Overall Verbal Expression: Appears within functional limits for tasks assessed Written Expression Dominant Hand: Right Written Expression: Within Functional Limits Oral Motor Oral Motor/Sensory Function Overall Oral Motor/Sensory Function: Within functional limits Motor Speech Overall Motor Speech: Appears within functional limits for tasks assessed  Care Tool Care Tool Cognition Expression of Ideas and Wants Expression of Ideas and Wants: Without difficulty (complex and basic) - expresses complex messages without difficulty and with speech that is clear and easy to understand    Understanding Verbal and Non-Verbal Content Understanding Verbal and Non-Verbal Content: Understands (complex and basic) - clear comprehension without cues or repetitions   Memory/Recall Ability *first 3 days only Memory/Recall Ability *first 3 days only: Current season;Location of own room;That he or she is in a hospital/hospital unit      Short Term Goals: Week 1: SLP Short Term Goal 1 (Week 1): Patient will utilize call bell to request assistance in 75% of opportunities with Mod verbal cues. SLP Short Term Goal 2 (Week 1): Patient will demonstrate functional problem solving for functional and mildly complex tasks with Min verbal cues. SLP Short Term Goal 3 (Week 1): Patient will self-monitor and correct errors during mildly complex tasks with Min verbal cues. SLP Short Term Goal 4 (Week 1): Patient will recall daily, functional information with Min verbal and visual cues. SLP Short Term Goal 5 (Week 1): Patient will demonstrate selective attention in a mildly distracting enviornment for 30 minutes with Min verbal cues for redirection.  Refer to Care Plan for Long Term Goals  Recommendations for other services: Neuropsych  Discharge Criteria: Patient will be discharged from SLP if patient refuses treatment 3 consecutive times without medical reason, if treatment goals not met, if there is a change in medical status, if patient makes no progress towards goals or if patient is discharged from hospital.  The above assessment, treatment plan, treatment alternatives and goals were discussed and mutually agreed upon: by patient  Kari Montero 10/02/2020, 11:11 AM

## 2020-10-02 NOTE — Progress Notes (Signed)
Pt upset with care he has received since entering the hospital. Stated that "he came in to the hospital walking and then he could more his left side, he was told he may not walk again". Emotional support given to patient. Attempted to educated pt on safety protocols. Pt was slightly agitated about it but has been calm and compliant about it. Told patient he would be evaluated in morning. Call bell within reach and all needs met.

## 2020-10-02 NOTE — Evaluation (Signed)
Occupational Therapy Assessment and Plan  Patient Details  Name: Brent Harrington MRN: [id number] Date of Birth: 02-06-1951  OT Diagnosis: cognitive deficits, hemiplegia affecting non-dominant side, and muscle weakness (generalized) Rehab Potential: Rehab Potential (ACUTE ONLY): Excellent ELOS: 14-21 days   Today's Date: 10/02/2020 OT Individual Time: 8264-1583 OT Individual Time Calculation (min): 60 min     Hospital Problem: Principal Problem:   Meningioma, cerebral ([place])   Past Medical History:  Past Medical History:  Diagnosis Date   Anxiety    Cancer ([place]) 2012   Colon Cancer   Past Surgical History:  Past Surgical History:  Procedure Laterality Date   APPLICATION OF CRANIAL NAVIGATION Right 09/24/2020   Procedure: APPLICATION OF CRANIAL NAVIGATION;  Surgeon: Vallarie Mare, MD;  Location: [facility];  Service: Neurosurgery;  Laterality: Right;   COLON SURGERY  2012   CRANIOTOMY Right 09/24/2020   Procedure: RIGHT CRANIOTOMY FOR RESECTION OF FALCINE MENINGIOMA;  Surgeon: Vallarie Mare, MD;  Location: [place];  Service: Neurosurgery;  Laterality: Right;    Assessment & Plan Clinical Impression: Patient is a 70 y.o. 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 Marland Kitchen  He was admitted on 09/24/2020 for right craniotomy and resection of meningioma by Dr. Marcello Moores.  Postop has had left upper greater than left lower extremity weakness with numbness.  Follow-up MRI brain showed gross total resection of meningioma with persistent edema in right hemisphere and small perioperative infarct in midportion of corpus callosum, possible 1 or 2 punctate perioperative infarcts and white matter on the right as well as 1 cm IPH right frontoparietal vertex with mild edema--no hydrocephalus or extra-axial fluid  collection.  He was started on Decadron for edema control and has had improvement in LUE strength. He continues to be limited by LLE weakness with decreased hemiplegia as well as impulsivity with poor safety awareness. Patient transferred to CIR on 10/01/2020 .    Patient currently requires max with basic self-care skills secondary to muscle weakness, impaired timing and sequencing, abnormal tone, unbalanced muscle activation, ataxia, and decreased coordination, decreased attention to left, decreased problem solving, decreased safety awareness, and decreased memory, and decreased sitting balance, decreased standing balance, decreased postural control, hemiplegia, and decreased balance strategies.  Prior to hospitalization, patient could complete BADL with independent .  Patient will benefit from skilled intervention to increase independence with basic self-care skills prior to discharge home with care partner.  Anticipate patient will require 24 hour supervision and follow up home health.  OT - End of Session Endurance Deficit: Yes Endurance Deficit Description: Rest breaks within BADL tasks OT Assessment Rehab Potential (ACUTE ONLY): Excellent OT Patient demonstrates impairments in the following area(s): Balance;Behavior;Cognition;Endurance;Motor;Perception;Safety OT Basic ADL's Functional Problem(s): Grooming;Dressing;Bathing;Toileting;Eating OT Transfers Functional Problem(s): Toilet;Tub/Shower OT Additional Impairment(s): Fuctional Use of Upper Extremity OT Plan OT Intensity: Minimum of 1-2 x/day, 45 to 90 minutes OT Frequency: 5 out of 7 days OT Duration/Estimated Length of Stay: 14-21 days OT Treatment/Interventions: Balance/vestibular training;Cognitive remediation/compensation;Community reintegration;DME/adaptive equipment instruction;Discharge planning;Disease mangement/prevention;Functional electrical stimulation;Functional mobility training;Pain management;Neuromuscular  re-education;Wheelchair propulsion/positioning;Visual/perceptual remediation/compensation;UE/LE Coordination activities;UE/LE Strength taining/ROM;Therapeutic Exercise;Therapeutic Activities;Splinting/orthotics;Skin care/wound managment;Self Care/advanced ADL retraining;Psychosocial support;Patient/family education OT Self Feeding Anticipated Outcome(s): Mod I OT Basic Self-Care Anticipated Outcome(s): Supervision/CGA OT Toileting Anticipated Outcome(s): Supervision OT Bathroom Transfers Anticipated Outcome(s): CGA OT Recommendation Recommendations for Other Services:  Therapeutic Recreation consult Therapeutic Recreation Interventions: Outing/community reintergration;Stress management Patient destination: Home Follow Up Recommendations: Home health OT;Outpatient OT (Vs) Equipment Recommended: Tub/shower bench;3 in 1 bedside comode  OT Evaluation Precautions/Restrictions  Precautions Precautions: Fall Precaution Comments: left hemi Restrictions Weight Bearing Restrictions: No  Pain  Denies pain Home Living/Prior Functioning Home Living Family/patient expects to be discharged to:: Private residence Living Arrangements: Spouse/significant other, Children Available Help at Discharge: Family, Available 24 hours/day Type of Home: House Home Access: Stairs to enter [employer] of Steps: 2 Entrance Stairs-Rails: None Home Layout: Able to live on main level with bedroom/bathroom, Multi-level Bathroom Shower/Tub: Optometrist: Yes  Lives With: Family IADL History Leisure and Hobbies: Retired from [employer], likes to piddle with lawmowers IADL Comments: Active and independent PTA Prior Function Level of Independence: Independent with homemaking with ambulation, Independent with basic ADLs Driving: Yes Comments: independent Vision Patient Visual Report: No change from baseline Perception  Perception:  Impaired Inattention/Neglect: Does not attend to left side of body (Mild) Cognition Overall Cognitive Status: Impaired/Different from baseline Arousal/Alertness: Awake/alert Orientation Level: Person;Place;Situation Person: Oriented Place: Oriented Situation: Oriented Year: 2022 Month: July Day of Week: Correct Memory: Impaired Immediate Memory Recall: Sock;Blue;Bed Memory Recall Sock: Not able to recall Memory Recall Blue: Without Cue Memory Recall Bed: With Cue Awareness: Impaired Behaviors: Impulsive Safety/Judgment: Impaired Sensation Sensation Light Touch: Impaired Detail Light Touch Impaired Details: Impaired LLE Coordination Gross Motor Movements are Fluid and Coordinated: No Fine Motor Movements are Fluid and Coordinated: No Coordination and Movement Description: decreased smoothness and accuracy with L UE 9 Hole Peg Test: R: 31, L 41 Motor  Motor Motor: Hemiplegia Motor - Skilled Clinical Observations: L hemiplegia LE>UE  Balance Balance Balance Assessed: Yes Static Sitting Balance Static Sitting - Balance Support: Feet supported Static Sitting - Level of Assistance: 5: Stand by assistance Dynamic Sitting Balance Dynamic Sitting - Balance Support: Feet supported;During functional activity Dynamic Sitting - Level of Assistance: 5: Stand by assistance Static Standing Balance Static Standing - Balance Support: During functional activity Static Standing - Level of Assistance: 4: Min assist;3: Mod assist Dynamic Standing Balance Dynamic Standing - Balance Support: During functional activity Dynamic Standing - Level of Assistance: 3: Mod assist;2: Max assist Extremity/Trunk Assessment RUE Assessment RUE Assessment: Within Functional Limits LUE Assessment LUE Assessment: Exceptions to [facility] General Strength Comments: Mild strength deficits 4-/5 grossly  Care Tool Care Tool Self Care Eating        Oral Care    Oral Care Assist Level: Supervision/Verbal  cueing    Bathing   Body parts bathed by patient: Right arm;Left arm;Chest;Abdomen;Front perineal area;Buttocks;Right upper leg;Left upper leg;Face;Right lower leg Body parts bathed by helper: Left lower leg   Assist Level: Moderate Assistance - Patient 50 - 74%    Upper Body Dressing(including orthotics)   What is the patient wearing?: Pull over shirt   Assist Level: Minimal Assistance - Patient > 75%    Lower Body Dressing (excluding footwear)   What is the patient wearing?: Pants Assist for lower body dressing: Maximal Assistance - Patient 25 - 49%    Putting on/Taking off footwear   What is the patient wearing?: Non-skid slipper socks Assist for footwear: Maximal Assistance - Patient 25 - 49%       Care Tool Toileting Toileting activity   Assist for toileting: Maximal Assistance - Patient 25 - 49%       Care Tool Transfers Sit to stand transfer   Sit  to stand assist level: Maximal Assistance - Patient 25 - 49%    Chair/bed transfer   Chair/bed transfer assist level: Maximal Assistance - Patient 25 - 49%     Toilet transfer   Assist Level: Maximal Assistance - Patient 24 - 49%     Care Tool Cognition Expression of Ideas and Wants Expression of Ideas and Wants: Without difficulty (complex and basic) - expresses complex messages without difficulty and with speech that is clear and easy to understand   Understanding Verbal and Non-Verbal Content Understanding Verbal and Non-Verbal Content: Understands (complex and basic) - clear comprehension without cues or repetitions   Memory/Recall Ability *first 3 days only Memory/Recall Ability *first 3 days only: Current season;Location of own room;That he or she is in a hospital/hospital unit    Refer to Care Plan for [place] 1 OT Short Term Goal 1 (Week 1): Patient will complete toilet transfer with mod A OT Short Term Goal 2 (Week 1): Patient will complete sit<>stand with mod A OT Short Term  Goal 3 (Week 1): Pt will use L UE within bathing tasks with min verbal cues  Recommendations for other services: Therapeutic Recreation  Stress management and Outing/community reintegration   Skilled Therapeutic Intervention Pt greeted sated in recliner and agreeable to OT eval and treat. OT eval completed addressing rehab process, OT purpose, POC, ELOS, and goals. Nursing administered meds while OT obtained wc. Pt very impulsive with poor safety awareness throughout session. Pt needed multiple  cues to slow down. Pt transferred to weaker L side with max A and heavy L knee block 2/2 only trace activation in L leg. Bathing/dressing completed from wc at the sink with mod/max A to stand and L knee block again. See functional navigator below for further details on BADL performance. Pt left seated in wc with alarm belt on and instructed to call nursing if he needs to to the bathroom as pt declined multiple times with OT.   ADL ADL Eating: Set up Grooming: Minimal assistance Upper Body Bathing: Minimal assistance Lower Body Bathing: Maximal assistance Upper Body Dressing: Minimal assistance Lower Body Dressing: Maximal assistance Toileting: Maximal assistance Toilet Transfer: Maximal assistance;Moderate assistance Tub/Shower Transfer: Not assessed Mobility  Transfers Sit to Stand: Maximal Assistance - Patient 25-49% Stand to Sit: Maximal Assistance - Patient 25-49%   Discharge Criteria: Patient will be discharged from OT if patient refuses treatment 3 consecutive times without medical reason, if treatment goals not met, if there is a change in medical status, if patient makes no progress towards goals or if patient is discharged from hospital.  The above assessment, treatment plan, treatment alternatives and goals were discussed and mutually agreed upon: by patient  Valma Cava 10/02/2020, 10:00 AM

## 2020-10-02 NOTE — Progress Notes (Signed)
Pt stated his back was hurting from staying in the bed. Offered PRN pain medication, pt declined saying "he would get his sister to get him out and taken elsewhere during the day if possible". Attempted to reeducate, pt remains unhappy but cooperative. Call bell within reach all other needs addressed.

## 2020-10-02 NOTE — Progress Notes (Signed)
PROGRESS NOTE   Subjective/Complaints: Was upset last night that he wasn't allowed to get out of the bed. Feeling better this morning that he's been up with therapy. Frustrated that left leg is still weak  ROS: Patient denies fever, rash, sore throat, blurred vision, nausea, vomiting, diarrhea, cough, shortness of breath or chest pain, joint or back pain, headache, or mood change.    Objective:   No results found. Recent Labs    10/02/20 0457  WBC 20.8*  HGB 14.8  HCT 45.2  PLT 256   Recent Labs    10/02/20 0457  NA 136  K 4.4  CL 102  CO2 29  GLUCOSE 102*  BUN 27*  CREATININE 1.15  CALCIUM 8.7*    Intake/Output Summary (Last 24 hours) at 10/02/2020 1042 Last data filed at 10/02/2020 0837 Gross per 24 hour  Intake 356 ml  Output 2225 ml  Net -1869 ml        Physical Exam: Vital Signs Blood pressure 115/85, pulse (!) 58, temperature 97.9 F (36.6 C), temperature source Oral, resp. rate 16, height 5\' 11"  (1.803 m), weight 95.1 kg, SpO2 97 %.  General: Alert and oriented x 3, No apparent distress HEENT: Head is normocephalic, atraumatic, PERRLA, EOMI, sclera anicteric, oral mucosa pink and moist, dentition intact, ext ear canals clear,  Neck: Supple without JVD or lymphadenopathy Heart: Reg rate and rhythm. No murmurs rubs or gallops Chest: CTA bilaterally without wheezes, rales, or rhonchi; no distress Abdomen: Soft, non-tender, non-distended, bowel sounds positive. Extremities: No clubbing, cyanosis, or edema. Pulses are 2+ Psych: Pt's affect is generally appropriate. Pt is cooperative Skin: Clean and intact without signs of breakdown Neuro: Pt is cognitively appropriate with normal insight, memory, and awareness. Cranial nerves 2-12 are intact. Sensory exam is diminished for LT in left arm and leg but able to sense pain. LUE touch only minimally diminished..   No tremors. Motor function is grossly 5/5 on  right. LUE 4 to 4+/5. LLE tr-1 HF and 0/5 at knee and ankle.  Musculoskeletal: Full ROM, No pain with AROM or PROM in the neck, trunk, or extremities. Posture appropriate    Assessment/Plan: 1. Functional deficits which require 3+ hours per day of interdisciplinary therapy in a comprehensive inpatient rehab setting. Physiatrist is providing close team supervision and 24 hour management of active medical problems listed below. Physiatrist and rehab team continue to assess barriers to discharge/monitor patient progress toward functional and medical goals  Care Tool:  Bathing    Body parts bathed by patient: Right arm, Left arm, Chest, Abdomen, Front perineal area, Buttocks, Right upper leg, Left upper leg, Face, Right lower leg   Body parts bathed by helper: Left lower leg     Bathing assist Assist Level: Moderate Assistance - Patient 50 - 74%     Upper Body Dressing/Undressing Upper body dressing   What is the patient wearing?: Pull over shirt    Upper body assist Assist Level: Minimal Assistance - Patient > 75%    Lower Body Dressing/Undressing Lower body dressing      What is the patient wearing?: Pants     Lower body assist Assist for lower body  dressing: Maximal Assistance - Patient 25 - 49%     Toileting Toileting    Toileting assist Assist for toileting: Maximal Assistance - Patient 25 - 49%     Transfers Chair/bed transfer  Transfers assist     Chair/bed transfer assist level: Maximal Assistance - Patient 25 - 49%     Locomotion Ambulation   Ambulation assist              Walk 10 feet activity   Assist           Walk 50 feet activity   Assist           Walk 150 feet activity   Assist           Walk 10 feet on uneven surface  activity   Assist           Wheelchair     Assist               Wheelchair 50 feet with 2 turns activity    Assist            Wheelchair 150 feet activity      Assist          Blood pressure 115/85, pulse (!) 58, temperature 97.9 F (36.6 C), temperature source Oral, resp. rate 16, height 5\' 11"  (1.803 m), weight 95.1 kg, SpO2 97 %.  Medical Problem List and Plan: 1.  decreased mobility and ADLs secondary to RIght frontal meningioma              -patient may not shower             -ELOS/Goals: 10-14d  -begin therapies today  -PRAFO LLE 2.  Antithrombotics: -DVT/anticoagulation:  Pharmaceutical: Heparin             -antiplatelet therapy: N/A 3. Pain Management: Tylenol as needed 4. Mood: LCSW to follow for evaluation and support             -antipsychotic agents: N/AA 5. Neuropsych: This patient is capable of making decisions on his own behalf. 6. Skin/Wound Care: Routine pressure-relief measures. 7. Fluids/Electrolytes/Nutrition: encourage PO I personally reviewed the patient's labs today.  -BUN elevated---encourage PO -albumin sl low---protein supp 8.  Cerebral edema: Completes Decadron taper today  -suspect leukocytosis is d/t steroids--no signs of infection otherwise. 9. Leukocytosis likely reactive/steroid related  -recheck CBC tomorrow 7/15 10.  History of depression: Continue Lexapro 11.  Left focal motor seizure w/aura: Reports that he still continues to have seizures every 2-3 weeks/last a couple of days PTA --continue Keppra 750 mg twice daily. No changes unless documented seizure activity occurs    LOS: 1 days A FACE TO FACE EVALUATION WAS PERFORMED  Meredith Staggers 10/02/2020, 10:42 AM

## 2020-10-02 NOTE — Progress Notes (Signed)
Inpatient Rehabilitation  Patient information reviewed and entered into eRehab system by Jesslynn Kruck M. Mazi Brailsford, M.A., CCC/SLP, PPS Coordinator.  Information including medical coding, functional ability and quality indicators will be reviewed and updated through discharge.    

## 2020-10-02 NOTE — Plan of Care (Signed)
  Problem: Sit to Stand Goal: LTG:  Patient will perform sit to stand in prep for activites of daily living with assistance level (OT) Description: LTG:  Patient will perform sit to stand in prep for activites of daily living with assistance level (OT) Flowsheets (Taken 10/02/2020 1515) LTG: PT will perform sit to stand in prep for activites of daily living with assistance level: Contact Guard/Touching assist   Problem: RH Eating Goal: LTG Patient will perform eating w/assist, cues/equip (OT) Description: LTG: Patient will perform eating with assist, with/without cues using equipment (OT) Flowsheets (Taken 10/02/2020 1515) LTG: Pt will perform eating with assistance level of: Independent   Problem: RH Grooming Goal: LTG Patient will perform grooming w/assist,cues/equip (OT) Description: LTG: Patient will perform grooming with assist, with/without cues using equipment (OT) Flowsheets (Taken 10/02/2020 1515) LTG: Pt will perform grooming with assistance level of: Independent   Problem: RH Bathing Goal: LTG Patient will bathe all body parts with assist levels (OT) Description: LTG: Patient will bathe all body parts with assist levels (OT) Flowsheets (Taken 10/02/2020 1515) LTG: Pt will perform bathing with assistance level/cueing: Supervision/Verbal cueing   Problem: RH Dressing Goal: LTG Patient will perform upper body dressing (OT) Description: LTG Patient will perform upper body dressing with assist, with/without cues (OT). Flowsheets (Taken 10/02/2020 1515) LTG: Pt will perform upper body dressing with assistance level of: Independent Goal: LTG Patient will perform lower body dressing w/assist (OT) Description: LTG: Patient will perform lower body dressing with assist, with/without cues in positioning using equipment (OT) Flowsheets (Taken 10/02/2020 1515) LTG: Pt will perform lower body dressing with assistance level of: Supervision/Verbal cueing   Problem: RH Toileting Goal: LTG  Patient will perform toileting task (3/3 steps) with assistance level (OT) Description: LTG: Patient will perform toileting task (3/3 steps) with assistance level (OT)  Flowsheets (Taken 10/02/2020 1515) LTG: Pt will perform toileting task (3/3 steps) with assistance level: Supervision/Verbal cueing   Problem: RH Functional Use of Upper Extremity Goal: LTG Patient will use RT/LT upper extremity as a (OT) Description: LTG: Patient will use right/left upper extremity as a stabilizer/gross assist/diminished/nondominant/dominant level with assist, with/without cues during functional activity (OT) Flowsheets (Taken 10/02/2020 1515) LTG: Use of upper extremity in functional activities: LUE as nondominant level   Problem: RH Toilet Transfers Goal: LTG Patient will perform toilet transfers w/assist (OT) Description: LTG: Patient will perform toilet transfers with assist, with/without cues using equipment (OT) Flowsheets (Taken 10/02/2020 1515) LTG: Pt will perform toilet transfers with assistance level of: Contact Guard/Touching assist   Problem: RH Tub/Shower Transfers Goal: LTG Patient will perform tub/shower transfers w/assist (OT) Description: LTG: Patient will perform tub/shower transfers with assist, with/without cues using equipment (OT) Flowsheets (Taken 10/02/2020 1515) LTG: Pt will perform tub/shower stall transfers with assistance level of: Contact Guard/Touching assist   Problem: RH Awareness Goal: LTG: Patient will demonstrate awareness during functional activites type of (OT) Description: LTG: Patient will demonstrate awareness during functional activites type of (OT) Flowsheets (Taken 10/02/2020 1515) LTG: Patient will demonstrate awareness during functional activites type of (OT): Supervision

## 2020-10-02 NOTE — Evaluation (Signed)
Physical Therapy Assessment and Plan  Patient Details  Name: Brent Harrington MRN: [id number] Date of Birth: August 24, 1950  PT Diagnosis: Abnormality of gait, Difficulty walking, Hemiplegia non-dominant, and Muscle weakness Rehab Potential: Good ELOS: ~3 weeks   Today's Date: 10/02/2020 PT Individual Time: 1660-6301 PT Individual Time Calculation (min): 55 min    Hospital Problem: Principal Problem:   Meningioma, cerebral ([place])   Past Medical History:  Past Medical History:  Diagnosis Date   Anxiety    Cancer ([place]) 2012   Colon Cancer   Past Surgical History:  Past Surgical History:  Procedure Laterality Date   APPLICATION OF CRANIAL NAVIGATION Right 09/24/2020   Procedure: APPLICATION OF CRANIAL NAVIGATION;  Surgeon: Vallarie Mare, MD;  Location: [facility];  Service: Neurosurgery;  Laterality: Right;   COLON SURGERY  2012   CRANIOTOMY Right 09/24/2020   Procedure: RIGHT CRANIOTOMY FOR RESECTION OF FALCINE MENINGIOMA;  Surgeon: Vallarie Mare, MD;  Location: [facility];  Service: Neurosurgery;  Laterality: Right;    Assessment & Plan Clinical Impression: Patient is a 70 year old RH male with history of colon cancer, anxiety as well as 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 Marland Kitchen  He was admitted on 09/24/2020 for right craniotomy and resection of meningioma by Dr. Marcello Moores.  Postop has had left upper greater than left lower extremity weakness with numbness.  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  He was started on Decadron for edema control  and has had improvement in LUE strength. He continues to be limited by LLE weakness with decreased hemiplegia as well as impulsivity with poor safety awareness. Patient transferred to CIR on 10/01/2020 .   Patient currently requires mod with mobility secondary to muscle weakness, decreased cardiorespiratoy endurance, unbalanced muscle activation and decreased coordination, and decreased sitting balance, decreased standing balance, decreased postural control, hemiplegia, and decreased balance strategies.  Prior to hospitalization, patient was independent  with mobility and lived with Family in a House home.  Home access is 2Stairs to enter.  Patient will benefit from skilled PT intervention to maximize safe functional mobility, minimize fall risk, and decrease caregiver burden for planned discharge home with 24 hour supervision.  Anticipate patient will benefit from follow up [place] at discharge.  PT - End of Session Activity Tolerance: Tolerates 30+ min activity with multiple rests Endurance Deficit: Yes Endurance Deficit Description: Rest breaks within mobility tasks PT Assessment Rehab Potential (ACUTE/IP ONLY): Good PT Barriers to Discharge: Home environment access/layout PT Patient demonstrates impairments in the following area(s): Balance;Endurance;Motor;Pain;Perception;Safety;Sensory PT Transfers Functional Problem(s): Bed Mobility;Bed to Chair;Car;Furniture PT Locomotion Functional Problem(s): Ambulation;Stairs PT Plan PT Intensity: Minimum of 1-2 x/day ,45 to 90 minutes PT Frequency: 5 out of 7 days PT Duration Estimated Length of Stay: ~3 weeks PT Treatment/Interventions: Ambulation/gait training;Community reintegration;DME/adaptive equipment instruction;Neuromuscular re-education;Psychosocial support;Stair training;UE/LE Strength taining/ROM;Wheelchair propulsion/positioning;Balance/vestibular training;Discharge planning;Functional electrical stimulation;Pain management;Skin care/wound  management;Therapeutic Activities;UE/LE Coordination activities;Cognitive remediation/compensation;Functional mobility training;Disease management/prevention;Patient/family education;Splinting/orthotics;Therapeutic Exercise;Visual/perceptual remediation/compensation PT Transfers Anticipated Outcome(s): CGA PT Locomotion Anticipated Outcome(s): CGA PT Recommendation Follow Up Recommendations: Home health PT;24 hour supervision/assistance Patient destination: Home Equipment Recommended: To  be determined   PT Evaluation Precautions/Restrictions Precautions Precautions: Fall Precaution Comments: left hemi Restrictions Weight Bearing Restrictions: No General Chart Reviewed: Yes Family/Caregiver Present: No  Home Living/Prior Functioning Home Living Available Help at Discharge: Family;Available 24 hours/day Type of Home: House Home Access: Stairs to enter [employer] of Steps: 2 Entrance Stairs-Rails: None Home Layout: Able to live on main level with bedroom/bathroom;Multi-level  Lives With: Family Prior Function  Able to Take Stairs?: Yes Driving: Yes Vision/Perception  Perception Perception: Impaired Inattention/Neglect:  (Mild L Inattention) Praxis Praxis: Intact  Cognition Overall Cognitive Status: Impaired/Different from baseline Arousal/Alertness: Awake/alert Orientation Level: Oriented X4 Attention: Sustained Sustained Attention: Impaired Sustained Attention Impairment: Functional basic Memory: Impaired Memory Impairment: Decreased short term memory;Decreased recall of new information Awareness: Impaired Awareness Impairment: Emergent impairment Problem Solving: Impaired Problem Solving Impairment: Functional basic Behaviors: Impulsive Safety/Judgment: Impaired Sensation Sensation Light Touch: Impaired Detail Light Touch Impaired Details: Impaired LLE ("tingly") Coordination Gross Motor Movements are Fluid and Coordinated: No Fine Motor Movements  are Fluid and Coordinated: No Coordination and Movement Description: decreased smoothness and accuracy with L UE Motor  Motor Motor: Hemiplegia Motor - Skilled Clinical Observations: L hemiplegia LE>UE  Trunk/Postural Assessment  Cervical Assessment Cervical Assessment:  (forward head) Thoracic Assessment Thoracic Assessment:  (rounded shoulders) Lumbar Assessment Lumbar Assessment:  (posterior pelvic tilt) Postural Control Postural Control: Deficits on evaluation (L hemi, increased postural sway)  Balance Balance Balance Assessed: Yes Static Sitting Balance Static Sitting - Balance Support: Feet supported Static Sitting - Level of Assistance: 5: Stand by assistance Dynamic Sitting Balance Dynamic Sitting - Balance Support: Feet supported;During functional activity Dynamic Sitting - Level of Assistance: 5: Stand by assistance Sitting balance - Comments: Able to lean anteriorly without back support without LOB. Static Standing Balance Static Standing - Balance Support: During functional activity Static Standing - Level of Assistance: 3: Mod assist Dynamic Standing Balance Dynamic Standing - Balance Support: During functional activity Dynamic Standing - Level of Assistance: 2: Max assist Extremity Assessment  RLE Assessment RLE Assessment: Within Functional Limits LLE Assessment LLE Assessment: Exceptions to [facility] General Strength Comments: During MMT no contraction noted in LLE, but during mobility noted to have hip flexor, extensor, abductor, and adductor activation  Care Tool Care Tool Bed Mobility Roll left and right activity   Roll left and right assist level: Moderate Assistance - Patient 50 - 74%    Sit to lying activity   Sit to lying assist level: Moderate Assistance - Patient 50 - 74%    Lying to sitting edge of bed activity   Lying to sitting edge of bed assist level: Moderate Assistance - Patient 50 - 74%     Care Tool Transfers Sit to stand transfer   Sit  to stand assist level: Maximal Assistance - Patient 25 - 49%    Chair/bed transfer   Chair/bed transfer assist level: Maximal Assistance - Patient 25 - 49%     Toilet transfer   Assist Level: Maximal Assistance - Patient 24 - 49%    Car transfer   Car transfer assist level: Maximal Assistance - Patient 25 - 49%      Care Tool Locomotion Ambulation   Assist level: 2 helpers Assistive device: Hand held assist Max distance: 25'  Walk 10 feet activity   Assist level: 2 helpers Assistive device: Hand held assist   Walk 50 feet with 2 turns activity Walk 50 feet with 2 turns activity did not occur: Safety/medical concerns  Walk 150 feet activity Walk 150 feet activity did not occur: Safety/medical concerns      Walk 10 feet on uneven surfaces activity Walk 10 feet on uneven surfaces activity did not occur: Safety/medical concerns      Stairs Stair activity did not occur: Safety/medical concerns        Walk up/down 1 step activity Walk up/down 1 step or curb (drop down) activity did not occur: Safety/medical concerns     Walk up/down 4 steps activity did not occuR: Safety/medical concerns  Walk up/down 4 steps activity      Walk up/down 12 steps activity Walk up/down 12 steps activity did not occur: Safety/medical concerns      Pick up small objects from floor Pick up small object from the floor (from standing position) activity did not occur: Safety/medical concerns      Wheelchair Will patient use wheelchair at discharge?: No          Wheel 50 feet with 2 turns activity      Wheel 150 feet activity        Refer to Care Plan for Long Term Goals  SHORT TERM GOAL WEEK 1 PT Short Term Goal 1 (Week 1): Pt will perform bed mobility with minA. PT Short Term Goal 2 (Week 1): Pt will perform sit to stand transfer with modA. PT Short Term Goal 3 (Week 1): Pt will perform bed to chair transfer with modA. PT Short Term Goal 4 (Week 1): Pt will ambulate x50' with modA  +1 and LRAD.  Recommendations for other services: None   Skilled Therapeutic Intervention  Evaluation completed (see details above and below) with education on PT POC and goals and individual treatment initiated with focus on bed mobility, balance, functional transfers, and ambulation. Pt received seated in WC and agrees to therapy. No complaint of pain. WC transport to gym for time management. Pt performs sit to stand with maxA, with PT providing total blocking of L knee. Stand pivot transfer to/from car with maxA HHA. Pt self propels WC to gym with bilateral upper extremities and cues for improved propulsion technique. Pt performs squat pivot to mat table with modA and cues on head hips relationship and sequencing. Pt performs supine to sit with modA and PT management of L lower extremity. In supine, pt performs x15 supine bridges with PT providing tactile facilitation of L gluteal activation, and pt able to activate consistently. Pt also demos activation of L hip abductor, adductor, and flexors during functional movements. Supine to sit with modA. Pt performs squat pivot back to WC with modA. Pt ambulates x25' in hallway using R hand rail and PT providing totalA for management of L leg, blocking knee during stance phase and manually progressing during swing phase. Pt able to perform swing through gait pattern with R leg throughout. WC transport back to room. Pt left seated in WC with alarm intact and all needs within reach.  Mobility Bed Mobility Bed Mobility: Supine to Sit;Sit to Supine Supine to Sit: Moderate Assistance - Patient 50-74% Sit to Supine: Moderate Assistance - Patient 50-74% Transfers Transfers: Sit to Stand;Stand to Sit;Stand Pivot Transfers Sit to Stand: Maximal Assistance - Patient 25-49% Stand to Sit: Maximal Assistance - Patient 25-49% Stand Pivot Transfers: Maximal Assistance - Patient 25 - 49% Stand Pivot Transfer Details: Tactile cues for initiation;Tactile cues for  posture;Verbal cues for sequencing;Verbal cues for technique;Tactile cues for sequencing;Tactile cues for placement;Verbal cues for precautions/safety;Manual facilitation for weight bearing;Manual facilitation for placement  Transfer (Assistive device): 1 person hand held assist Locomotion  Gait Ambulation: Yes Gait Assistance: 2 Helpers Gait Distance (Feet): 25 Feet Assistive device: 1 person hand held assist;Other (Comment) (R Handrail and +2 WC follow) Gait Assistance Details: Manual facilitation for weight bearing;Manual facilitation for placement;Verbal cues for gait pattern;Verbal cues for sequencing;Verbal cues for technique;Tactile cues for posture;Tactile cues for initiation Gait Gait: Yes Gait Pattern: Impaired Gait Pattern:  (L hemi gait) Gait velocity: decreased Stairs / Additional Locomotion Stairs: No Wheelchair Mobility Wheelchair Mobility: Yes Wheelchair Assistance: Chartered loss adjuster: Both upper extremities Wheelchair Parts Management: Needs assistance Distance: 150'   Discharge Criteria: Patient will be discharged from PT if patient refuses treatment 3 consecutive times without medical reason, if treatment goals not met, if there is a change in medical status, if patient makes no progress towards goals or if patient is discharged from hospital.  The above assessment, treatment plan, treatment alternatives and goals were discussed and mutually agreed upon: by patient  Breck Coons, PT, DPT 10/02/2020, 2:15 PM

## 2020-10-03 DIAGNOSIS — G8194 Hemiplegia, unspecified affecting left nondominant side: Secondary | ICD-10-CM | POA: Diagnosis not present

## 2020-10-03 DIAGNOSIS — R7989 Other specified abnormal findings of blood chemistry: Secondary | ICD-10-CM | POA: Diagnosis not present

## 2020-10-03 DIAGNOSIS — D320 Benign neoplasm of cerebral meninges: Secondary | ICD-10-CM | POA: Diagnosis not present

## 2020-10-03 DIAGNOSIS — G40909 Epilepsy, unspecified, not intractable, without status epilepticus: Secondary | ICD-10-CM | POA: Diagnosis not present

## 2020-10-03 LAB — CBC
HCT: 46.3 % (ref 39.0–52.0)
Hemoglobin: 15.1 g/dL (ref 13.0–17.0)
MCH: 28.8 pg (ref 26.0–34.0)
MCHC: 32.6 g/dL (ref 30.0–36.0)
MCV: 88.2 fL (ref 80.0–100.0)
Platelets: 261 10*3/uL (ref 150–400)
RBC: 5.25 MIL/uL (ref 4.22–5.81)
RDW: 13.1 % (ref 11.5–15.5)
WBC: 20.5 10*3/uL — ABNORMAL HIGH (ref 4.0–10.5)
nRBC: 0 % (ref 0.0–0.2)
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
## 2020-10-03 NOTE — Progress Notes (Signed)
PROGRESS NOTE   Subjective/Complaints: In an irritable mood again this morning centered around not having therapy in afternoon yesterday (all of it was clustered in AM) and that his left leg is still weak (when he was told he would only be in hospital for a "couple days".   ROS: Patient denies fever, rash, sore throat, blurred vision, nausea, vomiting, diarrhea, cough, shortness of breath or chest pain, joint or back pain, headache  Objective:   No results found. Recent Labs    10/02/20 0457 10/03/20 0517  WBC 20.8* 20.5*  HGB 14.8 15.1  HCT 45.2 46.3  PLT 256 261   Recent Labs    10/02/20 0457  NA 136  K 4.4  CL 102  CO2 29  GLUCOSE 102*  BUN 27*  CREATININE 1.15  CALCIUM 8.7*    Intake/Output Summary (Last 24 hours) at 10/03/2020 1100 Last data filed at 10/03/2020 0738 Gross per 24 hour  Intake 1360 ml  Output 400 ml  Net 960 ml        Physical Exam: Vital Signs Blood pressure (!) 165/89, pulse 79, temperature 98.2 F (36.8 C), resp. rate 18, height 5\' 11"  (1.803 m), weight 95.1 kg, SpO2 100 %.  Constitutional: No distress . Vital signs reviewed. HEENT: EOMI, oral membranes moist Neck: supple Cardiovascular: RRR without murmur. No JVD    Respiratory/Chest: CTA Bilaterally without wheezes or rales. Normal effort    GI/Abdomen: BS +, non-tender, non-distended Ext: no clubbing, cyanosis, or edema Psych: cooperative but irritable. A little better after we talked. Skin: Clean and intact without signs of breakdown. [place], staples intact Neuro: Pt is cognitively appropriate with normal insight, memory, and awareness. Cranial nerves 2-12 are intact. Sensory exam is diminished for LT in left leg but able to sense pain. LUE touch only minimally diminished..   No tremors. Motor function is grossly 5/5 on right. LUE 4 to 4+/5. LLE tr-1 HF, HAD,HAB, and 0/5 at knee and ankle. Good sitting balance Musculoskeletal:  Full ROM, No pain with AROM or PROM in the neck, trunk, or extremities. Posture appropriate    Assessment/Plan: 1. Functional deficits which require 3+ hours per day of interdisciplinary therapy in a comprehensive inpatient rehab setting. Physiatrist is providing close team supervision and 24 hour management of active medical problems listed below. Physiatrist and rehab team continue to assess barriers to discharge/monitor patient progress toward functional and medical goals  Care Tool:  Bathing    Body parts bathed by patient: Right arm, Left arm, Chest, Abdomen, Front perineal area, Buttocks, Right upper leg, Left upper leg, Face, Right lower leg   Body parts bathed by helper: Left lower leg     Bathing assist Assist Level: Moderate Assistance - Patient 50 - 74%     Upper Body Dressing/Undressing Upper body dressing   What is the patient wearing?: Pull over shirt    Upper body assist Assist Level: Minimal Assistance - Patient > 75%    Lower Body Dressing/Undressing Lower body dressing      What is the patient wearing?: Pants     Lower body assist Assist for lower body dressing: Maximal Assistance - Patient 25 - 49%  Toileting Toileting    Toileting assist Assist for toileting: Moderate Assistance - Patient 50 - 74%     Transfers Chair/bed transfer  Transfers assist     Chair/bed transfer assist level: Maximal Assistance - Patient 25 - 49%     Locomotion Ambulation   Ambulation assist      Assist level: 2 helpers Assistive device: Hand held assist Max distance: 25'   Walk 10 feet activity   Assist     Assist level: 2 helpers Assistive device: Hand held assist   Walk 50 feet activity   Assist Walk 50 feet with 2 turns activity did not occur: Safety/medical concerns         Walk 150 feet activity   Assist Walk 150 feet activity did not occur: Safety/medical concerns         Walk 10 feet on uneven surface   activity   Assist Walk 10 feet on uneven surfaces activity did not occur: Safety/medical concerns         Wheelchair     Assist Will patient use wheelchair at discharge?: No             Wheelchair 50 feet with 2 turns activity    Assist            Wheelchair 150 feet activity     Assist          Blood pressure (!) 165/89, pulse 79, temperature 98.2 F (36.8 C), resp. rate 18, height 5\' 11"  (1.803 m), weight 95.1 kg, SpO2 100 %.  Medical Problem List and Plan: 1.  decreased mobility and ADLs secondary to RIght frontal meningioma              -patient may not shower             -ELOS/Goals: 10-14d  -explained to patient that he will only received 3-4 hours of therapy each day. He won't receive it "all" day. He's being allowed to propel in his w/c on unit. Also discussed the reasons he needs to be here to help promote return in his LLE but to also work compensatory strategies for mobility, orthotics, etc. Seemed to settle down a bit after we talked. Asked therapy to schedule therapies throughout the day and to avoid clustering close together.  -will need AFO  -PRAFO LLE while in bed 2.  Antithrombotics: -DVT/anticoagulation:  Pharmaceutical: Heparin             -antiplatelet therapy: N/A 3. Pain Management: Tylenol as needed 4. Mood: LCSW to follow for evaluation and support             -antipsychotic agents: N/AA 5. Neuropsych: This patient is capable of making decisions on his own behalf. 6. Skin/Wound Care: Routine pressure-relief measures. 7. Fluids/Electrolytes/Nutrition: encourage PO Recent BUN elevated---encourage PO -albumin sl low---protein supp -recheck labs monday 8.  Cerebral edema:   Decadron taper to off 9. Leukocytosis likely reactive/steroid related  -no clinical signs of infection -wbcs holding at 20.5k today -recheck Monday, monitor clinically 10.  History of depression: Continue Lexapro  -struggling with current physical  situation after initial expectations  -team needs to keep providing + reinforcement  -steroids probably not helping with labile mood  -consider neuropsych eval 11.  Left focal motor seizure w/aura: ?Reports that he still continues to have seizures every 2-3 weeks/last a couple of days PTA --continue Keppra 750 mg twice daily. No changes unless documented seizure activity occurs    LOS: 2 days A  FACE TO FACE EVALUATION WAS PERFORMED  Meredith Staggers 10/03/2020, 11:00 AM

## 2020-10-03 NOTE — Progress Notes (Signed)
Inpatient Rehabilitation Care Coordinator Assessment and Plan Patient Details  Name: Brent Harrington MRN: [id number] Date of Birth: 07/11/1950  Today's Date: 10/03/2020  Hospital Problems: Principal Problem:   Meningioma, cerebral [facility])  Past Medical History:  Past Medical History:  Diagnosis Date   Anxiety    Cancer ([place]) 2012   Colon Cancer   Past Surgical History:  Past Surgical History:  Procedure Laterality Date   APPLICATION OF CRANIAL NAVIGATION Right 09/24/2020   Procedure: APPLICATION OF CRANIAL NAVIGATION;  Surgeon: Vallarie Mare, MD;  Location: [facility];  Service: Neurosurgery;  Laterality: Right;   COLON SURGERY  2012   CRANIOTOMY Right 09/24/2020   Procedure: RIGHT CRANIOTOMY FOR RESECTION OF FALCINE MENINGIOMA;  Surgeon: Vallarie Mare, MD;  Location: [facility];  Service: Neurosurgery;  Laterality: Right;   Social History:  reports that he has never smoked. He has never used smokeless tobacco. He reports previous alcohol use. He reports that he does not use drugs.  Family / Support Systems Marital Status: Separated How Long?: Pt is verbally seperated from wife Alwyn, [place] for the last 3 weeks. Patient Roles: Spouse, Parent Spouse/Significant Other: RIK WADEL Children: 3 children. 2 adult children live at home. Other Supports: sister Hassan Rowan Anticipated Caregiver: various family members Ability/Limitations of Caregiver: dtr Belenda Cruise in nursing program. Caregiver Availability: 24/7 Family Dynamics: Pt lives in the home with 2 children.  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 Very restless during visit. Recent Psychosocial Issues: Pt admits to anxiety and takes Lexapro prescribed by PCP. Psychiatric History: Hx of anxiety reported Substance Abuse History: Pt denies  Patient / Family Perceptions, Expectations & Goals Pt/Family understanding of illness & functional limitations: Pt and family have a general understanding of care needs. Premorbid pt/family roles/activities: Independent PTA Anticipated changes in roles/activities/participation: Assistance with ADLs/IADLs Pt/family expectations/goals: Pt goal- "get my left leg moving so I can get out of here."  [employer]: None Premorbid Home Care/DME Agencies: None Transportation available at discharge: Family/TBD Resource referrals recommended: Other (Comment)  Discharge Planning Living Arrangements: Children, Spouse/significant other Support Systems: Children, Other relatives Type of Residence: Private residence Insurance Resources: Multimedia programmer (specify) Scientist, clinical (histocompatibility and immunogenetics) Medicare) Financial Resources: [place] Referred: No Living Expenses: Own Money Management: Patient Does the patient have any problems obtaining your medications?: No Home Management: All family managed home care needs Patient/Family Preliminary Plans: No changes Care Coordinator Anticipated Follow Up Needs: HH/OP Expected length of stay: 18-21 days  Clinical Impression SW met with pt and pt sister Hassan Rowan in room. SW introduced self to pt, explained role, and discussed discharge process. Pt is not a English as a second language teacher. No HCPOA. Advanced care directive consult  entered by nursing. Access to DME: canes, rollator, RW, shower chair, TTB, and lift chair (items were his mother's).  Laveah Gloster A Carlyann Placide 10/03/2020, 8:00 PM

## 2020-10-03 NOTE — Progress Notes (Signed)
Speech Language Pathology Daily Session Note  Patient Details  Name: Brent Harrington MRN: [id number] Date of Birth: 07-24-1950  Today's Date: 10/03/2020 SLP Individual Time: 1005-1050 SLP Individual Time Calculation (min): 45 min  Short Term Goals: Week 1: SLP Short Term Goal 1 (Week 1): Patient will utilize call bell to request assistance in 75% of opportunities with Mod verbal cues. SLP Short Term Goal 2 (Week 1): Patient will demonstrate functional problem solving for functional and mildly complex tasks with Min verbal cues. SLP Short Term Goal 3 (Week 1): Patient will self-monitor and correct errors during mildly complex tasks with Min verbal cues. SLP Short Term Goal 4 (Week 1): Patient will recall daily, functional information with Min verbal and visual cues. SLP Short Term Goal 5 (Week 1): Patient will demonstrate selective attention in a mildly distracting enviornment for 30 minutes with Min verbal cues for redirection.  Skilled Therapeutic Interventions: Skilled treatment session focused on cognitive goals. SLP facilitated session by providing supervision level verbal cues for recall of his current medications and their functions. Patient organized a BID pill box with initial Min A that faded to Mod I by end of task. Patient with increased emergent awareness of verbosity throughout session by required Min verbal cues for redirection. Patient appeared restless throughout session and was constantly moving about in the wheelchair around his room and within the SLP office. Patient's daughter present and educated regarding current plan of care. Patient left upright in wheelchair with alarm on and all needs within reach. Continue with current plan of care.      Pain No/Denies Pain   Therapy/Group: Individual Therapy  Eldredge Veldhuizen 10/03/2020, 2:44 PM

## 2020-10-03 NOTE — IPOC Note (Signed)
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  Hospital Problems: Principal Problem:   Meningioma, cerebral ([place])     Functional Problem List: Nursing Behavior, Endurance, Medication Management, Nutrition, Perception, Safety, Skin Integrity  PT Balance, Endurance, Motor, Pain, Perception, Safety, Sensory  OT Balance, Behavior, Cognition, Endurance, Motor, Perception, Safety  SLP Cognition  TR         Basic ADL's: OT Grooming, Dressing, Bathing, Toileting, Eating     Advanced  ADL's: OT       Transfers: PT Bed Mobility, Bed to Chair, Car, Manufacturing systems engineer, Metallurgist: PT Ambulation, Stairs     Additional Impairments: OT Fuctional Use of Upper Extremity  SLP Social Cognition   Social Interaction, Problem Solving, Memory, Attention, Awareness  TR      Anticipated Outcomes Item Anticipated Outcome  Self Feeding Mod I  Swallowing      Basic self-care  Supervision/CGA  Toileting  Supervision   Bathroom Transfers CGA  Bowel/Bladder  N/A  Transfers  CGA  Locomotion  CGA  Communication     Cognition  Supervision  Pain  < 3  Safety/Judgment  Min assist with no falls   Therapy Plan: PT Intensity: Minimum of 1-2 x/day ,45 to 90 minutes PT Frequency: 5 out of 7 days PT Duration Estimated Length of Stay: ~3 weeks OT Intensity: Minimum of 1-2 x/day, 45 to 90 minutes OT Frequency: 5 out of 7 days OT Duration/Estimated Length of Stay: 14-21 days SLP Intensity: Minumum of 1-2 x/day, 30 to 90 minutes SLP Frequency: 3 to 5 out of 7 days SLP Duration/Estimated Length of Stay: 18-21 days   Due to the current state of emergency, patients may not be receiving their 3-hours of Medicare-mandated therapy.   Team Interventions: Nursing Interventions Patient/Family Education, Disease Management/Prevention, Medication Management, Skin  Care/Wound Management, Cognitive Remediation/Compensation, Discharge Planning, Psychosocial Support  PT interventions Ambulation/gait training, Community reintegration, DME/adaptive equipment instruction, Neuromuscular re-education, Psychosocial support, Stair training, UE/LE Strength taining/ROM, Wheelchair propulsion/positioning, Training and development officer, Discharge planning, Functional electrical stimulation, Pain management, Skin care/wound management, Therapeutic Activities, UE/LE Coordination activities, Cognitive remediation/compensation, Functional mobility training, Disease management/prevention, Patient/family education, Splinting/orthotics, Therapeutic Exercise, Visual/perceptual remediation/compensation  OT Interventions Training and development officer, Cognitive remediation/compensation, Community reintegration, Engineer, drilling, Discharge planning, Disease mangement/prevention, Functional electrical stimulation, Functional mobility training, Pain management, Neuromuscular re-education, Wheelchair propulsion/positioning, Visual/perceptual remediation/compensation, UE/LE Coordination activities, UE/LE Strength taining/ROM, Therapeutic Exercise, Therapeutic Activities, Splinting/orthotics, Skin care/wound managment, Self Care/advanced ADL retraining, Psychosocial support, Patient/family education  SLP Interventions Cognitive remediation/compensation, Internal/external aids, Therapeutic Activities, Environmental controls, Cueing hierarchy, Functional tasks, Patient/family education  TR Interventions    SW/CM Interventions Discharge Planning, Patient/Family Education, Psychosocial Support   Barriers to Discharge MD  Medical stability and Behavior  Nursing Decreased caregiver support, Home environment access/layout, Wound Care, Lack of/limited family support, Weight, Medication compliance, Behavior Multi-level home,able to live on main level with bed/bath. 2 steps to enter-no rails.  Spouse is retired Corporate treasurer, can stay with patient while kids work. Patient and spouse are separated. Spouse can provide min assist.  PT Home environment access/layout    OT      SLP      SW       Team Discharge Planning: Destination: PT-Home ,OT- Home , SLP-Home Projected Follow-up: PT-Home health PT, 24 hour supervision/assistance, OT-  Home health OT, Outpatient OT (Vs), SLP-Home Health SLP, Outpatient SLP, 24 hour supervision/assistance Projected  Equipment Needs: PT-To be determined, OT- Tub/shower bench, 3 in 1 bedside comode, SLP-None recommended by SLP Equipment Details: PT- , OT-  Patient/family involved in discharge planning: PT- Patient,  OT-Patient, SLP-Patient  MD ELOS: 14-20 days Medical Rehab Prognosis:  Excellent Assessment: The patient has been admitted for CIR therapies with the diagnosis of 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 The team will be addressing functional mobility, strength, stamina, balance, safety, adaptive techniques and equipment, self-care, bowel and bladder mgt, patient and caregiver education,NMR, cognitive behavioral rx, coping skills, orthotics, community reentry. Goals have been set at supervision to [facility] with self-care, CGA with mobility and supervision to [employer] cognition.   Due to the current state of emergency, patients may not be receiving their 3 hours per day of Medicare-mandated therapy.    Meredith Staggers, MD, FAAPMR     See Team Conference Notes for weekly updates to the plan of care

## 2020-10-03 NOTE — Progress Notes (Addendum)
Occupational Therapy Session Note  Patient Details  Name: Brent Harrington MRN: [id number] Date of Birth: Mar 27, 1950  Today's Date: 10/03/2020 OT Individual Time: 5027-1423 OT Individual Time Calculation (min): 60 min    Short Term Goals: Week 1:  OT Short Term Goal 1 (Week 1): Patient will complete toilet transfer with mod A OT Short Term Goal 2 (Week 1): Patient will complete sit<>stand with mod A OT Short Term Goal 3 (Week 1): Pt will use L UE within bathing tasks with min verbal cues  Skilled Therapeutic Interventions/Progress Updates:    Pt greeted semi-reclined in bed. Pt frustrated that he has not been out of bed yet and has complaints about spilling urinal at night. OT explained in detail the benefits of staying in rehab for intensive therapy and explained OT goals. MD also entered room and explained the same to patient. Pt seemed less upset and more agreeable to goals of rehab program. Pt with some hip activation to come to siting EOB, but still needed mod A from OT. Worked on lateral scooting with education provided for head/hips relationship. Mod A with knee block for lateral scooting transfer. Pt brought to the sink and completed grooming tasks with supervision. Pt declined bathing but wanted to don pants. Pt needed OT assist to thread L LE, then completed sit<>stand at the sink with mod A and L knee block. 3 sets of 10 squats at the sink. OT placed ball between patients legs and worked on hip adduction. Educated on there-ex patient can do in the room using ball and orange theraband for UB there-ex. Pt returned to room and was left seated in wc with alarm belt on, call bell in reach, and needs met.   Therapy Documentation Precautions:  Precautions Precautions: Fall Precaution Comments: left hemi Restrictions Weight Bearing Restrictions: No Pain: Pain Assessment Pain Scale: 0-10   Therapy/Group: Individual Therapy  Valma Cava 10/03/2020, 9:46 AM

## 2020-10-03 NOTE — Progress Notes (Signed)
Occupational Therapy Session Note  Patient Details  Name: Lamir Racca MRN: [id number] Date of Birth: 1950/11/16  Today's Date: 10/03/2020 OT Individual Time: 1430-1500 OT Individual Time Calculation (min): 30 min    Short Term Goals: Week 1:  OT Short Term Goal 1 (Week 1): Patient will complete toilet transfer with mod A OT Short Term Goal 2 (Week 1): Patient will complete sit<>stand with mod A OT Short Term Goal 3 (Week 1): Pt will use L UE within bathing tasks with min verbal cues  Skilled Therapeutic Interventions/Progress Updates:    Pt received sitting in the w/c with his daughter present, agreeable to OT session. Pt completed w/c propulsion to the therapy gym with CGA. He completed squat pivot transfer to the mat with mod A. Pt impulsive with transfers. Pt transitioned into supine and worked on B glute bridges with max facilitation to keep the LLE bent. Pt was able to activate his L glutes with increased time. Utilized friction reducing slide to work on hip adduction/abduction, pt required total A for for abduction, but was able to activate adduction, 2/5. Pt then worked on knee flexion/extension- with extensor tone becoming evident. Edu provided. Pt transferred to [facility] and pt completed several sit > stand with heavy blocking at the L knee, focusing on B weightbearing and reducing reliance on UE through the RW. Pt returned to his w/c and to his room. Chair alarm belt fastened, all needs met.   Therapy Documentation Precautions:  Precautions Precautions: Fall Precaution Comments: left hemi Restrictions Weight Bearing Restrictions: No  Therapy/Group: Individual Therapy  Curtis Sites 10/03/2020, 6:15 AM

## 2020-10-03 NOTE — Care Management (Signed)
[facility] Individual Statement of Services  Patient Name:  Brent Harrington  Date:  10/03/2020  Welcome to the [facility].  Our goal is to provide you with an individualized program based on your diagnosis and situation, designed to meet your specific needs.  With this comprehensive rehabilitation program, you will be expected to participate in at least 3 hours of rehabilitation therapies Monday-Friday, with modified therapy programming on the weekends.  Your rehabilitation program will include the following services:  Physical Therapy (PT), Occupational Therapy (OT), Speech Therapy (ST), 24 hour per day rehabilitation nursing, Therapeutic Recreaction (TR), Psychology, Neuropsychology, Care Coordinator, Rehabilitation Medicine, [place], and Other  Weekly team conferences will be held on Tuesdays to discuss your progress.  Your Inpatient Rehabilitation Care Coordinator will talk with you frequently to get your input and to update you on team discussions.  Team conferences with you and your family in attendance may also be held.  Expected length of stay: 18-21 days    Overall anticipated outcome: Contact Guard  Depending on your progress and recovery, your program may change. Your Inpatient Rehabilitation Care Coordinator will coordinate services and will keep you informed of any changes. Your Inpatient Rehabilitation Care Coordinator's name and contact numbers are listed  below.  The following services may also be recommended but are not provided by the [facility] will be made to provide these services after discharge if needed.  Arrangements include referral to agencies that provide these services.  Your insurance has been verified to be:  [employer]  Your primary  doctor is:  [facility].   Pertinent information will be shared with your doctor and your insurance company.  Inpatient Rehabilitation Care Coordinator:  Cathleen Corti [phone] or ([phone]  Information discussed with and copy given to patient by: Rana Snare, 10/03/2020, 2:19 PM

## 2020-10-03 NOTE — Progress Notes (Signed)
Patient ID: Brent Harrington, male   DOB: 01/18/1951, 69 y.o.   MRN: [id number]  SW made efforts to meet with pt to complete assessment, pt not in room due to therapies.SW met with sister and pt dtr. Pt sister Brent Harrington is main contact. Pt dtr lives in the home but in school for RN, so not as available. There is a son that lives in the home that may be able to assist. Family reported concerns related to getting pt up early in the morning to help with his mood since he is an early riser. SW will continue to make efforts to complete assessment.   *SW completed assessment with pt and pt sister Brent Harrington ([phone]). Brent Harrington will be primary contact to discuss pt care needs. Pt does not have HCPOA, however, pt would like to complete advance care directive. Assigned RN requested chaplain consult. Pt is verbally separated from wife Brent Harrington for the last 3 weeks.  Pt states his current wishes is for him to have his dtr Brent Harrington ([phone]) to make decisions.   See assessment in chart.   Auria Chamberlain, MSW, LCSWA Office: [phone] Cell: [phone] Fax: [phone]  

## 2020-10-03 NOTE — Plan of Care (Signed)
  Problem: Consults Goal: RH BRAIN INJURY PATIENT EDUCATION Description: Description: See Patient Education module for eduction specifics Outcome: Progressing Goal: Skin Care Protocol Initiated - if Braden Score 18 or less Description: If consults are not indicated, leave blank or document N/A Outcome: Progressing   Problem: RH SKIN INTEGRITY Goal: RH STG ABLE TO PERFORM INCISION/WOUND CARE W/ASSISTANCE Description: STG Able To Perform Incision/Wound Care With min Assistance. Outcome: Progressing   Problem: RH SAFETY Goal: RH STG ADHERE TO SAFETY PRECAUTIONS W/ASSISTANCE/DEVICE Description: STG Adhere to Safety Precautions With min Assistance/Device. Outcome: Progressing   Problem: RH COGNITION-NURSING Goal: RH STG ANTICIPATES NEEDS/CALLS FOR ASSIST W/ASSIST/CUES Description: STG Anticipates Needs/Calls for Assist With Cues and Reminders. Outcome: Progressing   Problem: RH KNOWLEDGE DEFICIT BRAIN INJURY Goal: RH STG INCREASE KNOWLEDGE OF SELF CARE AFTER BRAIN INJURY Description: Patient will be able to demonstrate knowledge of medication management, skin/wound care, and safety awareness with educational materials and handouts provided by staff independently at discharge. Outcome: Progressing

## 2020-10-03 NOTE — Progress Notes (Signed)
Physical Therapy Session Note  Patient Details  Name: Brent Harrington MRN: [id number] Date of Birth: 02-15-1951  Today's Date: 10/03/2020 PT Individual Time: 1540-1705 PT Individual Time Calculation (min): 85 min   Short Term Goals: Week 1:  PT Short Term Goal 1 (Week 1): Pt will perform bed mobility with minA. PT Short Term Goal 2 (Week 1): Pt will perform sit to stand transfer with modA. PT Short Term Goal 3 (Week 1): Pt will perform bed to chair transfer with modA. PT Short Term Goal 4 (Week 1): Pt will ambulate x50' with modA +1 and LRAD.  Skilled Therapeutic Interventions/Progress Updates:     Pt received seated in [facility] and agrees to therapy. No complaint of pain. Pt self propels WC to gym, x100', with  minA due to impaired pushing with left upper extremity. Pt performs gait training with litegait over treadmill for body weight supported ambulation. Sit to stand in litegait with minA and pt able to maintain standing balance with bilateral upper extremity support while PT provides totalA to don harness. PT provides totalA to manage L lower extremity when stepping up onto treadmill, requiring heavy modA to complete. Pt performs following bouts on treadmill: 1:30 at 0.6 mph for 57' 3:05 at 0.8 mph for 216' For 2nd bout PT ace wraps pt's L ankle to promote dorsiflexion and eversion. PT provides blocking of L knee during stance phase and totalA for progression of L leg during swing phase. Pt demos some tone in L leg that improves with distance. Pt cued to perform weight shift to the L and swing through gait pattern with R lower extremity. Between bouts of ambulation, pt utilizes body weight support to practice weight shifting to the L and performing L knee extensions with tactile cueing from PT for NM feedback. Pt also perform R leg lifts to promote WB on L side, x15. Pt demos some activation of L lower extremity extensor groups but also compensates with R leg in attempts to "move" L leg.  Pt  assisted out of Litegait with modA. Pt performs squat pivot transfer to mat table with modA to the R. Sit to supine with modA and management of L lower extremity. Pt performs 2x10 supine bridges with PT providing tactile facilitation of L gluteal contraction. Pt performs 2x10 L leg extensions with foot resting on exercise ball, then cued to attempt hip and knee flexion but with no activation noted. Pt performs small amplitude hip abduction with level 1 theraband around distal thighs.   Supine to sit with modA. Squat pivot back to WC with modA to the L and squat pivot to recliner with modA. Cues for head hips relationship and sequencing. Pt left with alarm intact and all needs within reach.  Therapy Documentation Precautions:  Precautions Precautions: Fall Precaution Comments: left hemi Restrictions Weight Bearing Restrictions: No    Therapy/Group: Individual Therapy  Breck Coons, PT, DPT 10/03/2020, 5:18 PM

## 2020-10-04 MED ORDER — METHOCARBAMOL 500 MG PO TABS: 500.0000 mg | ORAL_TABLET | Freq: Three times a day (TID) | ORAL | Status: DC | PRN

## 2020-10-04 MED ORDER — MAGNESIUM OXIDE -MG SUPPLEMENT 400 (240 MG) MG PO TABS
400.0000 mg | ORAL_TABLET | Freq: Two times a day (BID) | ORAL | Status: DC
  Administered 2020-10-04 – 2020-10-22 (×37): 400 mg via ORAL
  Filled 2020-10-04 (×37): qty 1

## 2020-10-04 NOTE — Progress Notes (Addendum)
Occupational Therapy Session Note  Patient Details  Name: Brent Harrington MRN: [id number] Date of Birth: [birth year]  Today's Date: 10/04/2020 OT Individual Time: 1300-1330 OT Individual Time Calculation (min): 30 min   Today's Date: 10/04/2020 OT Individual Time: 4193-7902 OT Individual Time Calculation (min): 58 min   Short Term Goals: Week 1:  OT Short Term Goal 1 (Week 1): Patient will complete toilet transfer with mod A OT Short Term Goal 2 (Week 1): Patient will complete sit<>stand with mod A OT Short Term Goal 3 (Week 1): Pt will use L UE within bathing tasks with min verbal cues  Skilled Therapeutic Interventions/Progress Updates:    Session 1:  Pt received in  w/c with cramping in ambdomen. MD alerted and prescribed meds. Edu re hydration to assist with cramping  ADL:   Pt completes LB dressing with S to thread Les and increased time to lift LLE into figure 4. STS at sink with MOD A for power up and VC for hand placement/anterior weight shift with heavy L knee block/stabilizing ankle d/t inversion tendency Pt completes footwear with S to doff socks in figure 4 and donshoes   Therapeutic activity W/c propulsion >300 feet with VC for lighter pushes with RUE and stronger with LUE for steering and ant weight shift for uphill pushing to outside courtyard/fountain area for mood support.   Pt completes 3 STS at tabletop with MOD A with L knee block as written above with emphasis on weight shift and L knee control when shifting weight onto L.  Pt left at end of session in w/c with exit alarm on, call light in reach and all needs met  Session 2:  Pt received in w/c with no pain. Agreeable to shower  ADL:  Pt completes bathing with at sit to stand with MOD A to power up into standing in shower with A to wash back, buttocks and feet d/t close quarters in shower stall Pt completes UB dressing with set up Pt completes LB dressing with MOD A at sit to stand level with crossing  figure 4 to thread BLE and increased time d/t increased tone in LLE Pt completes footwear with MOD A overall for donning shoes Pt completes shower/Tub transfer with MOD using grab bar to power up into standing.   Pt left at end of session in w/c with exit alarm on, call light in reach and all needs met   Therapy Documentation Precautions:  Precautions Precautions: Fall Precaution Comments: left hemi Restrictions Weight Bearing Restrictions: No General:   Vital Signs: Therapy Vitals Temp: (!) 97.5 F (36.4 C) Temp Source: Oral Pulse Rate: 71 Resp: 18 BP: 112/65 Patient Position (if appropriate): Lying Oxygen Therapy SpO2: 95 % O2 Device: Room Air Pain:   ADL: ADL Eating: Set up Grooming: Minimal assistance Upper Body Bathing: Minimal assistance Lower Body Bathing: Maximal assistance Upper Body Dressing: Minimal assistance Lower Body Dressing: Maximal assistance Toileting: Maximal assistance Toilet Transfer: Maximal assistance, Moderate assistance Tub/Shower Transfer: Not assessed Vision   Perception    Praxis   Exercises:   Other Treatments:     Therapy/Group: Individual Therapy  Tonny Branch 10/04/2020, 6:47 AM

## 2020-10-04 NOTE — Plan of Care (Signed)
  Problem: Consults Goal: RH BRAIN INJURY PATIENT EDUCATION Description: Description: See Patient Education module for eduction specifics Outcome: Progressing Goal: Skin Care Protocol Initiated - if Braden Score 18 or less Description: If consults are not indicated, leave blank or document N/A Outcome: Progressing   Problem: RH SKIN INTEGRITY Goal: RH STG ABLE TO PERFORM INCISION/WOUND CARE W/ASSISTANCE Description: STG Able To Perform Incision/Wound Care With min Assistance. Outcome: Progressing   Problem: RH SAFETY Goal: RH STG ADHERE TO SAFETY PRECAUTIONS W/ASSISTANCE/DEVICE Description: STG Adhere to Safety Precautions With min Assistance/Device. Outcome: Progressing   Problem: RH COGNITION-NURSING Goal: RH STG ANTICIPATES NEEDS/CALLS FOR ASSIST W/ASSIST/CUES Description: STG Anticipates Needs/Calls for Assist With Cues and Reminders. Outcome: Progressing   Problem: RH KNOWLEDGE DEFICIT BRAIN INJURY Goal: RH STG INCREASE KNOWLEDGE OF SELF CARE AFTER BRAIN INJURY Description: Patient will be able to demonstrate knowledge of medication management, skin/wound care, and safety awareness with educational materials and handouts provided by staff independently at discharge. Outcome: Progressing

## 2020-10-04 NOTE — Progress Notes (Signed)
Physical Therapy Session Note  Patient Details  Name: Brent Harrington MRN: [id number] Date of Birth: 11-Oct-1950  Today's Date: 10/04/2020 PT Individual Time: 9924-2683 and 1030-1130 PT Individual Time Calculation (min): 32 min and 60 min  Short Term Goals: Week 1:  PT Short Term Goal 1 (Week 1): Pt will perform bed mobility with minA. PT Short Term Goal 2 (Week 1): Pt will perform sit to stand transfer with modA. PT Short Term Goal 3 (Week 1): Pt will perform bed to chair transfer with modA. PT Short Term Goal 4 (Week 1): Pt will ambulate x50' with modA +1 and LRAD. Week 2:    Week 3:     Skilled Therapeutic Interventions/Progress Updates:  AM SESSION   received seated in WC and agrees to therapy. No complaint of pain. Pt self propels x 152ft including turns with  supervision.  Pt performs gait training with litegait over treadmill for body weight supported ambulation. Sit to stand in litegait with minA and pt able to maintain standing balance with bilateral upper extremity support while PT provides totalA to don harness. PT provides totalA to manage L lower extremity when stepping up onto treadmill, requiring heavy modA to complete.   Pt performed mult gait trials at .79mph, distances 25-75 ft initially w/L acewrap, switched to AFO due to increased ankle support in ill fitting shoe (requested family bring tennis shoes) PT provides blocking of L knee during stance phase and totalA for progression of L leg during swing phase. Tendency tfor flexed posture and to 'sit back" into litegait support at pelvis limiting advancement of stance limbs.  Pt cued to perform weight shift to the L and swing through gait pattern with R lower extremity.   In standing w/LiteGait, pt performing L knee extensions with tactile cueing from PT for NM feedback, heavy mod to facilitate extension.    Backing off of TM, max assist, max cues for sequencing.  Pt assisted out of Litegait with modA.  Pt propels back to room  153ft mod I w/wc.  Pt left oob in wc w/alarm belt set and needs in reach   PM SESSION: Pt initially oob in wc.  DENIES PAIN Wc propulsion room to dayroom approx 161ft w/supervision, occasional cues for straight path, veers L.  Pt performs squat pivot transfer to mat table with modA to the L. Sit to supine with modA and management of L lower extremity. Pt performs cleamshells w/therapist limiting abd/ER randge of motion to prevent loss of control, cues to attend to limb/maintain attention to task  2x10  supine bridges w/focus on maintaining neurtal hip/dual activation. X 15 Attempted feet on theraball to stabilize ball but unable to control w/L Supine to prone w/min assist.  Attempted eccetric deceleration of knee extension for reactionary hamstring activation but unsuccessful w/mult attempts Attempted prone to quadruped w/very strong extensor tone LLE w/this activity made it impossible even w/+2 assist. Prone to supine to sit w/max cues for safety and sequencing.  Attempted eccretric L quads but w/not activation. Mat to wc squat pivot w/mod assist.   Retropulsion of wc w/total assist to facilitate LLE in bipedal fashion 79ft x 3.  Pt propels to room w/supervision.  Pt left oob in wc w/alarm belt set and needs in reach Family in room w/pt.  Therapy Documentation Precautions:  Precautions Precautions: Fall Precaution Comments: left hemi Restrictions Weight Bearing Restrictions: No     Therapy/Group: Individual Therapy Callie Fielding, [place] 10/04/2020, 4:55 PM

## 2020-10-05 NOTE — Progress Notes (Signed)
PROGRESS NOTE   Subjective/Complaints: I Pt reports would like grounds pass- wrote for it, Ok'd by PT- as long as with son/family and no more than 90 minutes- cannot miss meds or therapy.   Also started Magnesium 400 mg BID yesterday for muscle spasms- pt reports much better since 2nd dose. Of Mg- hasn't taken Robaxin prn.    ROS:  Pt denies SOB, abd pain, CP, N/V/C/D, and vision changes   Objective:   No results found. Recent Labs    10/03/20 0517  WBC 20.5*  HGB 15.1  HCT 46.3  PLT 261   No results for input(s): NA, K, CL, CO2, GLUCOSE, BUN, CREATININE, CALCIUM in the last 72 hours.   Intake/Output Summary (Last 24 hours) at 10/05/2020 1339 Last data filed at 10/05/2020 0743 Gross per 24 hour  Intake 177 ml  Output 850 ml  Net -673 ml        Physical Exam: Vital Signs Blood pressure 109/72, pulse 67, temperature (!) 97.5 F (36.4 C), temperature source Oral, resp. rate 18, height 5\' 11"  (1.803 m), weight 95.1 kg, SpO2 97 %.    General: awake, alert, appropriate, up in w/c in room; wheeling around; less irritable; NAD HENT: conjugate gaze; oropharynx moist CV: regular rate; no JVD Pulmonary: CTA B/L; no W/R/R- good air movement GI: soft, NT, ND, (+)BS Psychiatric: appropriate- less irritable;  Neurological: alert Ext: no clubbing, cyanosis, or edema Psych: cooperative but irritable. A little better after we talked. Skin: Clean and intact without signs of breakdown. Incison, staples intact Neuro: Pt is cognitively appropriate with normal insight, memory, and awareness. Cranial nerves 2-12 are intact. Sensory exam is diminished for LT in left leg but able to sense pain. LUE touch only minimally diminished..   No tremors. Motor function is grossly 5/5 on right. LUE 4 to 4+/5. LLE tr-1 HF, HAD,HAB, and 0/5 at knee and ankle. Good sitting balance Musculoskeletal: Full ROM, No pain with AROM or PROM in the neck,  trunk, or extremities. Posture appropriate    Assessment/Plan: 1. Functional deficits which require 3+ hours per day of interdisciplinary therapy in a comprehensive inpatient rehab setting. Physiatrist is providing close team supervision and 24 hour management of active medical problems listed below. Physiatrist and rehab team continue to assess barriers to discharge/monitor patient progress toward functional and medical goals  Care Tool:  Bathing    Body parts bathed by patient: Right arm, Left arm, Chest, Abdomen, Front perineal area, Right upper leg, Left upper leg, Face, Right lower leg   Body parts bathed by helper: Left lower leg, Buttocks     Bathing assist Assist Level: Moderate Assistance - Patient 50 - 74%     Upper Body Dressing/Undressing Upper body dressing   What is the patient wearing?: Pull over shirt    Upper body assist Assist Level: Supervision/Verbal cueing    Lower Body Dressing/Undressing Lower body dressing      What is the patient wearing?: Pants     Lower body assist Assist for lower body dressing: Moderate Assistance - Patient 50 - 74%     Toileting Toileting    Toileting assist Assist for toileting: Moderate Assistance -  Patient 50 - 74%     Transfers Chair/bed transfer  Transfers assist     Chair/bed transfer assist level: Maximal Assistance - Patient 25 - 49%     Locomotion Ambulation   Ambulation assist      Assist level: 2 helpers Assistive device: Hand held assist Max distance: 25'   Walk 10 feet activity   Assist     Assist level: 2 helpers Assistive device: Hand held assist   Walk 50 feet activity   Assist Walk 50 feet with 2 turns activity did not occur: Safety/medical concerns         Walk 150 feet activity   Assist Walk 150 feet activity did not occur: Safety/medical concerns         Walk 10 feet on uneven surface  activity   Assist Walk 10 feet on uneven surfaces activity did not occur:  Safety/medical concerns         Wheelchair     Assist Will patient use wheelchair at discharge?: No             Wheelchair 50 feet with 2 turns activity    Assist            Wheelchair 150 feet activity     Assist          Blood pressure 109/72, pulse 67, temperature (!) 97.5 F (36.4 C), temperature source Oral, resp. rate 18, height 5\' 11"  (1.803 m), weight 95.1 kg, SpO2 97 %.  Medical Problem List and Plan: 1.  decreased mobility and ADLs secondary to RIght frontal meningioma              -patient may not shower             -ELOS/Goals: 10-14d  -explained to patient that he will only received 3-4 hours of therapy each day. He won't receive it "all" day. He's being allowed to propel in his w/c on unit. Also discussed the reasons he needs to be here to help promote return in his LLE but to also work compensatory strategies for mobility, orthotics, etc. Seemed to settle down a bit after we talked. Asked therapy to schedule therapies throughout the day and to avoid clustering close together.  -will need AFO  -PRAFO LLE while in bed  -con't PT, OT, and SLP 2.  Antithrombotics: -DVT/anticoagulation:  Pharmaceutical: Heparin             -antiplatelet therapy: N/A 3. Pain Management: Tylenol as needed  7/17- added Robaxin prn and Magnesium 40 mg BID for muscle spasms- pt specifically asked for it- since water soluble, will try- worked well-pt feeling better- con't regimen 4. Mood: LCSW to follow for evaluation and support             -antipsychotic agents: N/AA 5. Neuropsych: This patient is capable of making decisions on his own behalf. 6. Skin/Wound Care: Routine pressure-relief measures. 7. Fluids/Electrolytes/Nutrition: encourage PO Recent BUN elevated---encourage PO -albumin sl low---protein supp -recheck labs monday 8.  Cerebral edema:   Decadron taper to off 9. Leukocytosis likely reactive/steroid related  -no clinical signs of infection -wbcs  holding at 20.5k today -recheck Monday, monitor clinically 10.  History of depression: Continue Lexapro  -struggling with current physical situation after initial expectations  -team needs to keep providing + reinforcement  -steroids probably not helping with labile mood  -consider neuropsych eval 11.  Left focal motor seizure w/aura: ?Reports that he still continues to have seizures every 2-3 weeks/last  a couple of days PTA --continue Keppra 750 mg twice daily. No changes unless documented seizure activity occurs 7/17- no seizures seen this weekend- con't regimen 12. Dispo  7/17- will allow grounds pass- with family- no more than 90 minutes- cannot miss therapy or medications- tell nursing that going.     LOS: 4 days A FACE TO FACE EVALUATION WAS PERFORMED  Brent Harrington 10/05/2020, 1:39 PM

## 2020-10-05 NOTE — Progress Notes (Signed)
Physical Therapy Session Note  Patient Details  Name: Brent Harrington MRN: [id number] Date of Birth: 11/14/1950  Today's Date: 10/05/2020 PT Individual Time: 6812-7517 PT Individual Time Calculation (min): 75 min   Short Term Goals: Week 1:  PT Short Term Goal 1 (Week 1): Pt will perform bed mobility with minA. PT Short Term Goal 2 (Week 1): Pt will perform sit to stand transfer with modA. PT Short Term Goal 3 (Week 1): Pt will perform bed to chair transfer with modA. PT Short Term Goal 4 (Week 1): Pt will ambulate x50' with modA +1 and LRAD.  Skilled Therapeutic Interventions/Progress Updates:     Patient in w/c in the room upon PT arrival. Patient alert and agreeable to PT session. Patient denied pain during session.  Therapeutic Activity: Transfers: Patient performed sit to/from stand x2 using a R rail in the hallway and x4 in the // bars with CGA and L weight shift with heavy use of upper extremities. Provided verbal cues for forward weight shift, foot placement, R weight shift, and R hip/knee/trunk extension to stand. Improved with mirror in front for visual feedback.  Doffed/donned L shoe x2 to place PLS AFO for DF assist then switch to Walk-on AFO with anterior strut to prevent R knee buckling in stance. USed Ted hose for easier donning of shoe due to thick socks donned prior to session.   Wheelchair Mobility:  Patient propelled wheelchair 120 feet with supervision using B upper extremities, tend to veer L and required multiple strokes on the L to one on the R to steer straight due to impaired timing of movement. Provided verbal cues for increased stroke length on L and coordination of timing with verbal cues.  Neuromuscular Re-ed: Patient performed the following balance and lower extremity motor control activities for improved muscle activation with functional mobility: -standing weight shifts R/L in the // bars focused on reducing L knee buckling with weight bearing and activation  of L gluteals and quads to push his weight to the other leg to promote L knee/hip extension 2x1 min, improved on second trial with mirror for visual feedback -ambulating 5 feet with a R rail with PLS x2 with total A for L foot advancement and max A for blocking L knee to prevent buckling and support weight due to posterior bias -ambulating 5 feet forwards x3 and backwards x1 in the // bars with Walk-on AFO with anterior strut with total-max A for L limb advancement and min A for blocking L knee in stance to prevent buckling, improved posterior bias with cues to bring his hands forward on the bars and improved weight shift to L following standing weight shifts and with use of the mirror -backwards propulsion of the w/c with B lower extremities focused on reciprocal pattern and L quad activation, required total A for L foot placement and min a-supervision for pushing backwards x100 feet and max-total a for L foot placement ahead and min A and facilitation for increased weight bearing for patient to perform reciprocal propulsion with B lower extremities forward x20 feet for increased L hamstring activation  Educated on neuroplasticity, pathophysiology of recovery following tumor resection, benefits of motor imagery, and consequences of consistent use of compensatory strategies for motor learning. Patient appreciative and attentive to all education during rest breaks.  Patient in w/c in the room at end of session with breaks locked, seat blet alarm set, and all needs within reach. Doffed AFO with no sign of skin breakdown upon removal.  Therapy  Documentation Precautions:  Precautions Precautions: Fall Precaution Comments: left hemi Restrictions Weight Bearing Restrictions: No    Therapy/Group: Individual Therapy  Brent Harrington L Brent Harrington PT, DPT  10/05/2020, 4:25 PM

## 2020-10-05 NOTE — Plan of Care (Signed)
  Problem: Consults Goal: RH BRAIN INJURY PATIENT EDUCATION Description: Description: See Patient Education module for eduction specifics Outcome: Progressing Goal: Skin Care Protocol Initiated - if Braden Score 18 or less Description: If consults are not indicated, leave blank or document N/A Outcome: Progressing   Problem: RH SKIN INTEGRITY Goal: RH STG ABLE TO PERFORM INCISION/WOUND CARE W/ASSISTANCE Description: STG Able To Perform Incision/Wound Care With min Assistance. Outcome: Progressing   Problem: RH SAFETY Goal: RH STG ADHERE TO SAFETY PRECAUTIONS W/ASSISTANCE/DEVICE Description: STG Adhere to Safety Precautions With min Assistance/Device. Outcome: Progressing   Problem: RH COGNITION-NURSING Goal: RH STG ANTICIPATES NEEDS/CALLS FOR ASSIST W/ASSIST/CUES Description: STG Anticipates Needs/Calls for Assist With Cues and Reminders. Outcome: Progressing   Problem: RH KNOWLEDGE DEFICIT BRAIN INJURY Goal: RH STG INCREASE KNOWLEDGE OF SELF CARE AFTER BRAIN INJURY Description: Patient will be able to demonstrate knowledge of medication management, skin/wound care, and safety awareness with educational materials and handouts provided by staff independently at discharge. Outcome: Progressing

## 2020-10-06 LAB — BASIC METABOLIC PANEL
Anion gap: 7 (ref 5–15)
BUN: 24 mg/dL — ABNORMAL HIGH (ref 8–23)
CO2: 28 mmol/L (ref 22–32)
Calcium: 8.8 mg/dL — ABNORMAL LOW (ref 8.9–10.3)
Chloride: 101 mmol/L (ref 98–111)
Creatinine, Ser: 1.28 mg/dL — ABNORMAL HIGH (ref 0.61–1.24)
GFR, Estimated: >60 mL/min (ref >60)
Glucose, Bld: 71 mg/dL (ref 70–99)
Potassium: 3.7 mmol/L (ref 3.5–5.1)
Sodium: 136 mmol/L (ref 135–145)

## 2020-10-06 LAB — CBC
HCT: 45.7 % (ref 39.0–52.0)
Hemoglobin: 15.1 g/dL (ref 13.0–17.0)
MCH: 29.3 pg (ref 26.0–34.0)
MCHC: 33 g/dL (ref 30.0–36.0)
MCV: 88.6 fL (ref 80.0–100.0)
Platelets: 232 10*3/uL (ref 150–400)
RBC: 5.16 MIL/uL (ref 4.22–5.81)
RDW: 13.2 % (ref 11.5–15.5)
WBC: 15.7 10*3/uL — ABNORMAL HIGH (ref 4.0–10.5)
nRBC: 0 % (ref 0.0–0.2)

## 2020-10-06 NOTE — Progress Notes (Signed)
Physical Therapy Session Note  Patient Details  Name: Brent Harrington MRN: [id number] Date of Birth: [birth year]  Today's Date: 10/06/2020 PT Individual Time: 0830-0905 PT Individual Time Calculation (min): 35 min   Short Term Goals: Week 1:  PT Short Term Goal 1 (Week 1): Pt will perform bed mobility with minA. PT Short Term Goal 2 (Week 1): Pt will perform sit to stand transfer with modA. PT Short Term Goal 3 (Week 1): Pt will perform bed to chair transfer with modA. PT Short Term Goal 4 (Week 1): Pt will ambulate x50' with modA +1 and LRAD.  Skilled Therapeutic Interventions/Progress Updates:     Patient in w/c in the room upon PT arrival. Patient alert and agreeable to PT session. Patient denied pain during session.  Doffed L sock and shoe and donned TED hose, Walk-on AFO with anterior strut, and tennis shoe with total A for energy/time management. Educated patient on skin inspections for skin breakdown and removal of AFO during the day to reduce pressure.   Neuromuscular Re-ed: Patient performed the following lower extremity motor control and standing balance activities: -sit to/from stand at the sink x1 and with RW x2 with min A, using mirror above the sink for visual feedback and cues for achieving body symmetry for increased L hemibody activation -standing balance >3 min x2 with B upper extremity support progressing from min A-supervision with cues for midline orientation, L knee/hip elongation with facilitation at the knee and gluteals -standing balance as above with focus on L knee extension with tactile feedback to maintain knee extension and not touch therapist's hand progressing to weight shifts with pushing knee straight, out of therapist's hand to shift to the R  MD rounded at end of session, discussed AFO and patient's progress.  Patient in w/c in the room at end of session with breaks locked, chair alarm set, and all needs within reach.   Therapy  Documentation Precautions:  Precautions Precautions: Fall Precaution Comments: left hemi Restrictions Weight Bearing Restrictions: No    Therapy/Group: Individual Therapy  Brent Harrington PT, DPT  10/06/2020, 8:48 PM

## 2020-10-06 NOTE — Discharge Instructions (Addendum)
Inpatient Rehab Discharge Instructions  Danta Baumgardner Discharge date and time:  10/22/20  Activities/Precautions/ Functional Status: Activity: no lifting, driving, or strenuous exercise  till cleared by MD Diet: regular diet Wound Care: keep wound clean and dry  Functional status:  ___ No restrictions     ___ Walk up steps independently _X__ 24/7 supervision/assistance   ___ Walk up steps with assistance ___ Intermittent supervision/assistance  ___ Bathe/dress independently ___ Walk with walker     ___ Bathe/dress with assistance ___ Walk Independently    ___ Shower independently ___ Walk with assistance    _X__ Shower with assistance _X__ No alcohol     ___ Return to work/school ________   COMMUNITY REFERRALS UPON DISCHARGE:    Home Health:   [facility]    [facility]:[phone] *Please expect follow-up within 2-3 days of discharge to schedule your home visit. If you have not received follow-up, be sure to contact the branch directly.*    Medical Equipment/Items Ordered: wheelchair, tub transfer bench, 3in1 bedside commode                                                 Agency/Supplier:[facility] [phone]  Special Instructions:   Per [facility] statutes, patients with seizures are not allowed to drive until  they have been seizure-free for six months. Use caution when using heavy equipment or power tools. Avoid working on ladders or at heights. Take showers instead of baths. Ensure the water temperature is not too high on the home water heater. Do not go swimming alone. When caring for infants or small children, sit down when holding, feeding, or changing them to minimize risk of injury to the child in the event you have a seizure. Also, Maintain good sleep hygiene. Avoid alcohol.   My questions have been answered and I understand these instructions. I will adhere to these goals and the provided  educational materials after my discharge from the hospital.  Patient/Caregiver Signature _______________________________ Date __________  Clinician Signature _______________________________________ Date __________  Please bring this form and your medication list with you to all your follow-up doctor's appointments.

## 2020-10-06 NOTE — Progress Notes (Signed)
PROGRESS NOTE   Subjective/Complaints: Happy to be seeing some return in his left leg. Appreciated grounds pass  ROS: Patient denies fever, rash, sore throat, blurred vision, nausea, vomiting, diarrhea, cough, shortness of breath or chest pain, joint or back pain, headache, or mood change.    Objective:   No results found. Recent Labs    10/06/20 0831  WBC 15.7*  HGB 15.1  HCT 45.7  PLT 232   Recent Labs    10/06/20 0831  NA 136  K 3.7  CL 101  CO2 28  GLUCOSE 71  BUN 24*  CREATININE 1.28*  CALCIUM 8.8*     Intake/Output Summary (Last 24 hours) at 10/06/2020 1125 Last data filed at 10/05/2020 1349 Gross per 24 hour  Intake 177 ml  Output --  Net 177 ml        Physical Exam: Vital Signs Blood pressure 96/69, pulse 65, temperature 97.8 F (36.6 C), resp. rate 18, height 5\' 11"  (1.803 m), weight 95.1 kg, SpO2 97 %.    Constitutional: No distress . Vital signs reviewed. HEENT: EOMI, oral membranes moist Neck: supple Cardiovascular: RRR without murmur. No JVD    Respiratory/Chest: CTA Bilaterally without wheezes or rales. Normal effort    GI/Abdomen: BS +, non-tender, non-distended Ext: no clubbing, cyanosis, or edema Psych: pleasant and cooperative  Skin: Clean and intact without signs of breakdown. Incison, staples intact Neuro: Pt is cognitively appropriate with normal insight, memory, and awareness. Cranial nerves 2-12 are intact. Sensory exam is diminished for LT in left leg but able to sense pain. LUE touch only minimally diminished..   No tremors. Motor function is grossly 5/5 on right. LUE 4 to 4+/5. LLE 1+ HF, HAD,HAB, 1/5 KE, and 0/5 at knee and ankle. Good sitting balance. Wearing AFO Musculoskeletal: Full ROM, No pain with AROM or PROM in the neck, trunk, or extremities. Posture appropriate    Assessment/Plan: 1. Functional deficits which require 3+ hours per day of interdisciplinary therapy  in a comprehensive inpatient rehab setting. Physiatrist is providing close team supervision and 24 hour management of active medical problems listed below. Physiatrist and rehab team continue to assess barriers to discharge/monitor patient progress toward functional and medical goals  Care Tool:  Bathing    Body parts bathed by patient: Right arm   Body parts bathed by helper: Left arm     Bathing assist Assist Level: Moderate Assistance - Patient 50 - 74%     Upper Body Dressing/Undressing Upper body dressing   What is the patient wearing?: Pull over shirt    Upper body assist Assist Level: Supervision/Verbal cueing    Lower Body Dressing/Undressing Lower body dressing      What is the patient wearing?: Pants     Lower body assist Assist for lower body dressing: Moderate Assistance - Patient 50 - 74%     Toileting Toileting    Toileting assist Assist for toileting: Moderate Assistance - Patient 50 - 74%     Transfers Chair/bed transfer  Transfers assist     Chair/bed transfer assist level: Minimal Assistance - Patient > 75%     Locomotion Ambulation   Ambulation assist  Assist level: Moderate Assistance - Patient 50 - 74% Assistive device: Parallel bars Max distance: 5 ft   Walk 10 feet activity   Assist     Assist level: 2 helpers Assistive device: Hand held assist   Walk 50 feet activity   Assist Walk 50 feet with 2 turns activity did not occur: Safety/medical concerns         Walk 150 feet activity   Assist Walk 150 feet activity did not occur: Safety/medical concerns         Walk 10 feet on uneven surface  activity   Assist Walk 10 feet on uneven surfaces activity did not occur: Safety/medical concerns         Wheelchair     Assist Will patient use wheelchair at discharge?: No Type of Wheelchair: Manual    Wheelchair assist level: Supervision/Verbal cueing Max wheelchair distance: 120 ft     Wheelchair 50 feet with 2 turns activity    Assist        Assist Level: Supervision/Verbal cueing   Wheelchair 150 feet activity     Assist          Blood pressure 96/69, pulse 65, temperature 97.8 F (36.6 C), resp. rate 18, height 5\' 11"  (1.803 m), weight 95.1 kg, SpO2 97 %.  Medical Problem List and Plan: 1.  decreased mobility and ADLs secondary to RIght frontal meningioma              -patient may not shower             -ELOS/Goals: 10-14d  -pt feeling a little better since he's seeing motor improvements  -AFO for gait  -PRAFO LLE while in bed  -con't PT, OT, and SLP  -grounds pass prn 2.  Antithrombotics: -DVT/anticoagulation:  Pharmaceutical: Heparin             -antiplatelet therapy: N/A 3. Pain Management: Tylenol as needed  7/17- added Robaxin prn and Magnesium 40 mg BID for muscle spasms-    7/18 using mag with good results--continue 4. Mood: LCSW to follow for evaluation and support             -antipsychotic agents: N/AA 5. Neuropsych: This patient is capable of making decisions on his own behalf. 6. Skin/Wound Care: Routine pressure-relief measures. 7. Fluids/Electrolytes/Nutrition: encourage PO Recent BUN elevated---sl improved 7/18---continue pus fluids -all labs reviewed 7/18 8.  Cerebral edema:   Decadron taper to off 9. Leukocytosis likely reactive/steroid related  -down to 15.7 7/18---no s/s infection 10.  History of depression: Continue Lexapro  -struggling with current physical situation after initial expectations--some improvement given neuro gains he's experienced  -team needs to keep providing + reinforcement  -consider neuropsych eval 11.  Left focal motor seizure w/aura: ?Reports that he still continues to have seizures every 2-3 weeks/last a couple of days PTA --continue Keppra 750 mg twice daily. No sz activity seen      LOS: 5 days A FACE TO FACE EVALUATION WAS PERFORMED  Meredith Staggers 10/06/2020, 11:25 AM

## 2020-10-06 NOTE — Progress Notes (Signed)
Speech Language Pathology Daily Session Note  Patient Details  Name: Pradyun Ishman MRN: [id number] Date of Birth: Sep 09, 1950  Today's Date: 10/06/2020 SLP Individual Time: 1345-1425 SLP Individual Time Calculation (min): 40 min  Short Term Goals: Week 1: SLP Short Term Goal 1 (Week 1): Patient will utilize call bell to request assistance in 75% of opportunities with Mod verbal cues. SLP Short Term Goal 2 (Week 1): Patient will demonstrate functional problem solving for functional and mildly complex tasks with Min verbal cues. SLP Short Term Goal 3 (Week 1): Patient will self-monitor and correct errors during mildly complex tasks with Min verbal cues. SLP Short Term Goal 4 (Week 1): Patient will recall daily, functional information with Min verbal and visual cues. SLP Short Term Goal 5 (Week 1): Patient will demonstrate selective attention in a mildly distracting enviornment for 30 minutes with Min verbal cues for redirection.  Skilled Therapeutic Interventions: Skilled treatment session focused on cognitive goals. SLP facilitated session by providing Min A verbal cues for sustained attention to tasks for 30 minutes and for complex problem solving during a medication task in which he had to identify errors. Overall, patient is making excellent progress towards goals. Patient left upright in bed with alarm on and all needs within reach. Continue with current plan of care.      Pain No/Denies Pain   Therapy/Group: Individual Therapy  Linh Johannes 10/06/2020, 3:30 PM

## 2020-10-06 NOTE — Progress Notes (Signed)
Physical Therapy Session Note  Patient Details  Name: Brent Harrington MRN: [id number] Date of Birth: [birth year]  Today's Date: 10/06/2020 PT Individual Time: 1130-1200 PT Individual Time Calculation (min): 30 min   Short Term Goals: Week 1:  PT Short Term Goal 1 (Week 1): Pt will perform bed mobility with minA. PT Short Term Goal 2 (Week 1): Pt will perform sit to stand transfer with modA. PT Short Term Goal 3 (Week 1): Pt will perform bed to chair transfer with modA. PT Short Term Goal 4 (Week 1): Pt will ambulate x50' with modA +1 and LRAD. Week 2:    Week 3:     Skilled Therapeutic Interventions/Progress Updates:   Pt initially oob in wc.  Propels to gym w/supervision, self corrects tendency to veer L. In parallel bars worked on wt shifting, LLE NMRE, midline stability, midline posture, LLE glut/quad activation including: Static stand, placing and holding protraction of L hip and shouldder to neutral/stands w/retraction w/gradual decrease in UE support from bilat to single to no UE support. Repeated Sit to stand w/1.5in stool under R foot to encourage L wt/shift/glut/quad activation and loading thru heel. Worked on use of ant wt shift and momentumm vs pullsing w/Ues to power up. Pt w/visisble L quad contraction and palpable HS acivation noted w/this activity, wears AFO during task.  L legrest lowered using wrench to improve pressure distribution and sitting posture. Pt propels to room as above. Pt performs "rocking" of wc via Lfoot placed on floor using alternating quad/HS activation LLE only. Therapy Documentation Precautions:  Precautions Precautions: Fall Precaution Comments: left hemi Restrictions Weight Bearing Restrictions: No     Therapy/Group: Individual Therapy Callie Fielding, [place] 10/06/2020, 12:33 PM

## 2020-10-06 NOTE — Progress Notes (Signed)
Occupational Therapy Session Note  Patient Details  Name: Brent Harrington MRN: [id number] Date of Birth: 27-Jul-1950  Today's Date: 10/06/2020 Session 1 OT Individual Time: 1000-1100 OT Individual Time Calculation (min): 60 min   Session 2 OT Individual Time: 1300-1345 OT Individual Time Calculation (min): 45 min    Short Term Goals: Week 1:  OT Short Term Goal 1 (Week 1): Patient will complete toilet transfer with mod A OT Short Term Goal 2 (Week 1): Patient will complete sit<>stand with mod A OT Short Term Goal 3 (Week 1): Pt will use L UE within bathing tasks with min verbal cues  Skilled Therapeutic Interventions/Progress Updates:    Session 1 Pt greeted seated in wc and agreeable to OT  treatment session. Pt reported he had already washed up and gotten dressed for the day. Pt reported PT helped him pull up his pants. Pt propelled wc to therapy gym wiith min cues as pt would occasionally veer to the L. Worked on sit<>stands and standing balance/endurance standing at high-low table while performing fine motor task with L hand. OT provided knee block and facilitated weight shifting onto LLE in standing. Graded peg board task focused on in hand manipulation, translation and rotation. Progressed to B UE task of pipe tree puzzle. Standing squats at high-low table to L knee block and mod A to get back to standing. OT discussed home bathroom set-up and practiced tub bench transfer with pt with mod A and max A to lift LLE over tub ledge. UB strength/endurance on [employer] arm bike on level 5. 5 minutes forward then 5 backwards. Pt returned to room and left seated in wc with alarm belt on, call bell in reach and needs met.   Session 2 Pt greeted seated in wc and agreeable to OT treatment session. Pt propelled wc to therapy gym and worked on [place]. Knee extension with pillow case on floor to decrease friction. Pt able to active knee extension with repetition! Pt also demonstrated hip adduction.  Sit<>stands in parallel bars with min A and  pt able to activate LLE and maintain hip and knee extension while weight bearing and weight shifting. Stepping forward and back with R LE to weight shift more onto L side. Pt returned to room and left seated in wc with alarm belt on, call bell in reach, and needs met.  Therapy Documentation Precautions:  Precautions Precautions: Fall Precaution Comments: left hemi Restrictions Weight Bearing Restrictions: No Pain:  Denies pain   Therapy/Group: Individual Therapy  Valma Cava 10/06/2020, 3:35 PM

## 2020-10-06 NOTE — Plan of Care (Signed)
  Problem: Consults Goal: RH BRAIN INJURY PATIENT EDUCATION Description: Description: See Patient Education module for eduction specifics Outcome: Progressing Goal: Skin Care Protocol Initiated - if Braden Score 18 or less Description: If consults are not indicated, leave blank or document N/A Outcome: Progressing   Problem: RH SKIN INTEGRITY Goal: RH STG ABLE TO PERFORM INCISION/WOUND CARE W/ASSISTANCE Description: STG Able To Perform Incision/Wound Care With min Assistance. Outcome: Progressing   Problem: RH SAFETY Goal: RH STG ADHERE TO SAFETY PRECAUTIONS W/ASSISTANCE/DEVICE Description: STG Adhere to Safety Precautions With min Assistance/Device. Outcome: Progressing   Problem: RH COGNITION-NURSING Goal: RH STG ANTICIPATES NEEDS/CALLS FOR ASSIST W/ASSIST/CUES Description: STG Anticipates Needs/Calls for Assist With Cues and Reminders. Outcome: Progressing   Problem: RH KNOWLEDGE DEFICIT BRAIN INJURY Goal: RH STG INCREASE KNOWLEDGE OF SELF CARE AFTER BRAIN INJURY Description: Patient will be able to demonstrate knowledge of medication management, skin/wound care, and safety awareness with educational materials and handouts provided by staff independently at discharge. Outcome: Progressing

## 2020-10-07 NOTE — Plan of Care (Signed)
  Problem: Consults Goal: RH BRAIN INJURY PATIENT EDUCATION Description: Description: See Patient Education module for eduction specifics Outcome: Progressing Goal: Skin Care Protocol Initiated - if Braden Score 18 or less Description: If consults are not indicated, leave blank or document N/A Outcome: Progressing   Problem: RH SKIN INTEGRITY Goal: RH STG ABLE TO PERFORM INCISION/WOUND CARE W/ASSISTANCE Description: STG Able To Perform Incision/Wound Care With min Assistance. Outcome: Progressing   Problem: RH SAFETY Goal: RH STG ADHERE TO SAFETY PRECAUTIONS W/ASSISTANCE/DEVICE Description: STG Adhere to Safety Precautions With min Assistance/Device. Outcome: Progressing   Problem: RH COGNITION-NURSING Goal: RH STG ANTICIPATES NEEDS/CALLS FOR ASSIST W/ASSIST/CUES Description: STG Anticipates Needs/Calls for Assist With Cues and Reminders. Outcome: Progressing   Problem: RH KNOWLEDGE DEFICIT BRAIN INJURY Goal: RH STG INCREASE KNOWLEDGE OF SELF CARE AFTER BRAIN INJURY Description: Patient will be able to demonstrate knowledge of medication management, skin/wound care, and safety awareness with educational materials and handouts provided by staff independently at discharge. Outcome: Progressing

## 2020-10-07 NOTE — Progress Notes (Signed)
Patient ID: Brent Harrington, male   DOB: 1950/09/22, 70 y.o.   MRN: [id number]  SW met with pt and pt sister Brent Harrington to provide updates from team conference, and d/c date 8/3. SW discussed scheduling family edu prior to pt d/c, and will discuss again next week. SW will continue to provide updates after team conference.   Loralee Pacas, MSW, [place] Office: [phone] Cell: [phone] Fax: [phone]

## 2020-10-07 NOTE — Progress Notes (Signed)
PROGRESS NOTE   Subjective/Complaints: Making gains in therapy. Pleased about return in left leg.   ROS: Patient denies fever, rash, sore throat, blurred vision, nausea, vomiting, diarrhea, cough, shortness of breath or chest pain, joint or back pain, headache, or mood change.    Objective:   No results found. Recent Labs    10/06/20 0831  WBC 15.7*  HGB 15.1  HCT 45.7  PLT 232   Recent Labs    10/06/20 0831  NA 136  K 3.7  CL 101  CO2 28  GLUCOSE 71  BUN 24*  CREATININE 1.28*  CALCIUM 8.8*     Intake/Output Summary (Last 24 hours) at 10/07/2020 1130 Last data filed at 10/07/2020 0100 Gross per 24 hour  Intake 177 ml  Output 300 ml  Net -123 ml        Physical Exam: Vital Signs Blood pressure 105/70, pulse 60, temperature 97.6 F (36.4 C), temperature source Oral, resp. rate 14, height 5\' 11"  (1.803 m), weight 95.1 kg, SpO2 96 %.    Constitutional: No distress . Vital signs reviewed. HEENT: EOMI, oral membranes moist Neck: supple Cardiovascular: RRR without murmur. No JVD    Respiratory/Chest: CTA Bilaterally without wheezes or rales. Normal effort    GI/Abdomen: BS +, non-tender, non-distended Ext: no clubbing, cyanosis, or edema Psych: pleasant and cooperative, a little anxious Skin: Clean and intact without signs of breakdown. [place], staples intact Neuro: Pt is cognitively appropriate with normal insight, memory, and awareness. Cranial nerves 2-12 are intact. LLE with sl diminished LT. LUE touch only minimally diminished..   No tremors. Motor function is grossly 5/5 on right. LUE 4 to 4+/5. LLE 1+ HF, HAD,HAB, 1+/5 KE, and 0/5 at knee and ankle Musculoskeletal: Full ROM, No pain with AROM or PROM in the neck, trunk, or extremities. Posture appropriate    Assessment/Plan: 1. Functional deficits which require 3+ hours per day of interdisciplinary therapy in a comprehensive inpatient rehab  setting. Physiatrist is providing close team supervision and 24 hour management of active medical problems listed below. Physiatrist and rehab team continue to assess barriers to discharge/monitor patient progress toward functional and medical goals  Care Tool:  Bathing    Body parts bathed by patient: Right arm   Body parts bathed by helper: Left arm     Bathing assist Assist Level: Moderate Assistance - Patient 50 - 74%     Upper Body Dressing/Undressing Upper body dressing   What is the patient wearing?: Pull over shirt    Upper body assist Assist Level: Supervision/Verbal cueing    Lower Body Dressing/Undressing Lower body dressing      What is the patient wearing?: Pants     Lower body assist Assist for lower body dressing: Moderate Assistance - Patient 50 - 74%     Toileting Toileting    Toileting assist Assist for toileting: Moderate Assistance - Patient 50 - 74%     Transfers Chair/bed transfer  Transfers assist     Chair/bed transfer assist level: Minimal Assistance - Patient > 75%     Locomotion Ambulation   Ambulation assist      Assist level: Moderate Assistance - Patient 50 -  74% Assistive device: Parallel bars Max distance: 5 ft   Walk 10 feet activity   Assist     Assist level: 2 helpers Assistive device: Hand held assist   Walk 50 feet activity   Assist Walk 50 feet with 2 turns activity did not occur: Safety/medical concerns         Walk 150 feet activity   Assist Walk 150 feet activity did not occur: Safety/medical concerns         Walk 10 feet on uneven surface  activity   Assist Walk 10 feet on uneven surfaces activity did not occur: Safety/medical concerns         Wheelchair     Assist Will patient use wheelchair at discharge?: No Type of Wheelchair: Manual    Wheelchair assist level: Supervision/Verbal cueing Max wheelchair distance: 120 ft    Wheelchair 50 feet with 2 turns  activity    Assist        Assist Level: Supervision/Verbal cueing   Wheelchair 150 feet activity     Assist          Blood pressure 105/70, pulse 60, temperature 97.6 F (36.4 C), temperature source Oral, resp. rate 14, height 5\' 11"  (1.803 m), weight 95.1 kg, SpO2 96 %.  Medical Problem List and Plan: 1.  decreased mobility and ADLs secondary to RIght frontal meningioma              -patient may not shower             -ELOS/Goals: 10-14d  -pt feeling a little better since he's seeing motor improvements  -AFO for gait  -PRAFO LLE while in bed  --Continue CIR therapies including PT, OT, and SLP   -grounds pass prn 2.  Antithrombotics: -DVT/anticoagulation:  Pharmaceutical: Heparin             -antiplatelet therapy: N/A 3. Pain Management: Tylenol as needed  7/17- added Robaxin prn and Magnesium 40 mg BID for muscle spasms-    7/19 pain seems controlled 4. Mood: LCSW to follow for evaluation and support             -antipsychotic agents: N/AA 5. Neuropsych: This patient is capable of making decisions on his own behalf. 6. Skin/Wound Care: Routine pressure-relief measures. 7. Fluids/Electrolytes/Nutrition: encourage PO Recent BUN elevated---sl improved 7/18---continue push fluids -recheck bmet end of week. 8.  Cerebral edema:   Decadron taper to off 9. Leukocytosis likely reactive/steroid related  -down to 15.7 7/18---no s/s infection 10.  History of depression: Continue Lexapro  -struggling with current physical situation after initial expectations--some improvement given neuro gains he's experienced  -team needs to keep providing + reinforcement  -consider neuropsych eval 11.  Left focal motor seizure w/aura: ?Reports that he still continues to have seizures every 2-3 weeks/last a couple of days PTA --continue Keppra 750 mg twice daily. No sz activity seen      LOS: 6 days A FACE TO FACE EVALUATION WAS PERFORMED  Meredith Staggers 10/07/2020, 11:30 AM

## 2020-10-07 NOTE — Progress Notes (Signed)
Physical Therapy Session Note  Patient Details  Name: Brent Harrington MRN: [id number] Date of Birth: Nov 16, 1950  Today's Date: 10/07/2020 PT Individual Time: 1304-1400 PT Individual Time Calculation (min): 56 min   Short Term Goals: Week 1:  PT Short Term Goal 1 (Week 1): Pt will perform bed mobility with minA. PT Short Term Goal 2 (Week 1): Pt will perform sit to stand transfer with modA. PT Short Term Goal 3 (Week 1): Pt will perform bed to chair transfer with modA. PT Short Term Goal 4 (Week 1): Pt will ambulate x50' with modA +1 and LRAD.  Skilled Therapeutic Interventions/Progress Updates:     Pt received seated in [facility] and agrees to therapy. Initially no complaint of pain. Pt self propels WC x150' to gym with cues for propulsion technique. Pt transfers from [facility] to mat with modA and squat pivot technique. Pt experiences bilateral leg cramps during transfer. Sit to supine with minA so that pt can rest and recover from leg cramps. Supine to sit with modA and cues for bridging technique with R leg in order to abduct L leg. Pt performs sit to stand with RW and modA. In standing pt cued to perform toe taps on alternating cones with R leg, with PT blocking L knee. Pt also requires minA/modA at hips due to posterior lean. Able to partially correct with cueing and use of mirror for visual feedback. Following seated rest break, pt ambulates x10' with RW and L AFO, with modA from PT for management of L leg. Pt does demo motor activation of L hip flexors but has difficulty with progression of L leg. Pt transfers back to mat with modA. Sit to supine to prone with modA. Pt positioned in prone for passive stretch of L hip flexors. Pt transitions to prone propped on elbows for increased soft tissue stretch on hip flexors. Pt attempts to transition to quadruped but is unable to get L knee under body, despite +2 assistance. When attempting to transition, L leg exhibits significant extensor tone and is very  difficulty to move. PT attempts to help pt into tall kneeling but is similarly unable to complete, largely due to L lower extremity tone. Pt then transitions to sitting with modA. Mat to [place] with modA. Pt self propels WC back to room. Left seated with alarm intact and all needs within reach.  Therapy Documentation Precautions:  Precautions Precautions: Fall Precaution Comments: left hemi Restrictions Weight Bearing Restrictions: No    Therapy/Group: Individual Therapy  Breck Coons, PT< DPT 10/07/2020, 4:00 PM

## 2020-10-07 NOTE — Progress Notes (Signed)
Speech Language Pathology Daily Session Note  Patient Details  Name: Brent Harrington MRN: [id number] Date of Birth: [birth year]  Today's Date: 10/07/2020 SLP Individual Time: 0825-0905 SLP Individual Time Calculation (min): 40 min  Short Term Goals: Week 1: SLP Short Term Goal 1 (Week 1): Patient will utilize call bell to request assistance in 75% of opportunities with Mod verbal cues. SLP Short Term Goal 2 (Week 1): Patient will demonstrate functional problem solving for functional and mildly complex tasks with Min verbal cues. SLP Short Term Goal 3 (Week 1): Patient will self-monitor and correct errors during mildly complex tasks with Min verbal cues. SLP Short Term Goal 4 (Week 1): Patient will recall daily, functional information with Min verbal and visual cues. SLP Short Term Goal 5 (Week 1): Patient will demonstrate selective attention in a mildly distracting enviornment for 30 minutes with Min verbal cues for redirection.  Skilled Therapeutic Interventions: Skilled treatment session focused on cognitive goals. SLP facilitated session by providing supervision level verbal cues for selective attention in a mildly distracting environment for 30 minutes. Supervision level verbal cues were also needed for error awareness during a complex scheduling/problem solving task. Patient independently reported improvements in LLE and was excited to show clinician new strength and movement. Patient left upright in wheelchair with alarm on and all needs within reach. Continue with current plan of care.      Pain Pain Assessment Pain Scale: 0-10 Pain Score: 0-No pain   Therapy/Group: Individual Therapy  Shavanna Furnari 10/07/2020, 10:07 AM

## 2020-10-07 NOTE — Patient Care Conference (Signed)
Inpatient RehabilitationTeam Conference and Plan of Care Update Date: 10/07/2020   Time: 10:40 AM    Patient Name: Brent Harrington      Medical Record Number: [id number]  Date of Birth: 05/26/1950 Sex: Male         Room/Bed: 4W15C/4W15C-01 Payor Info: Payor: [employer] MEDICARE / Plan: [employer] MEDICARE HMO/PPO / Product Type: *No Product type* /    Admit Date/Time:  10/01/2020  3:57 PM  Primary Diagnosis:  Meningioma, cerebral [facility])  Hospital Problems: Principal Problem:   Meningioma, cerebral [facility])    Expected Discharge Date: Expected Discharge Date: 10/22/20  Team Members Present: Physician leading conference: Dr. Alger Simons Care Coodinator Present: Loralee Pacas, LCSWA;Yalena Colon Creig Hines, RN, BSN, CRRN Nurse Present: Dorthula Nettles, RN PT Present: Tereasa Coop, PT OT Present: Cherylynn Ridges, OT SLP Present: Weston Anna, SLP PPS Coordinator present : Gunnar Fusi, SLP     Current Status/Progress Goal Weekly Team Focus  Bowel/Bladder   Continent of Bowel and Bladder.  Maintain Bowel and Bladder Control.      Swallow/Nutrition/ Hydration             ADL's   mod A transfers L, Min A transfers R, Min/mod A sit<>stands, Min/mod A LB ADLs  Supervision/CGA  LLE NMR, sit<>stands, transfers, self-care retraining, activity tolerance, functional use of L UE   Mobility             Communication             Safety/Cognition/ Behavioral Observations  Min A  Supervision  selective attention, problem solving, recall and awareness   Pain   Denies Pain,  Will remain Pain free. Assess Q shift.      Skin   Surgical Incision (with staples) to Scalp Healing. No sign of Infection.  Healing By Primary Intention. To maintain Skin Intergrity.        Discharge Planning:  D/c to home with son and dtr who live in the home. His sister [name] to be primary caregiver during the day since she is retired, and dtr in in nursing program.   Team Discussion: Had seizures prior to [place] admission but  hasn't had any since. Continent B/B, no pain reported. Craniotomy incision with staples, bruising to abdomen.  Patient on target to meet rehab goals: yes, min assist overall with ADL's. Little more assist with the weaker side. Min/mod assist with transfers. Good return in left leg. Cognition has improved.  *See Care Plan and progress notes for long and short-term goals.   Revisions to Treatment Plan:  Not at this time.  Teaching Needs: Family education, medication management, skin/wound care, depression management, transfer training, gait training, balance training, endurance training, safety awareness.  Current Barriers to Discharge: Decreased caregiver support, Medical stability, Home enviroment access/layout, Wound care, Lack of/limited family support, Weight, Medication compliance, and Behavior  Possible Resolutions to Barriers: Continue current medications, provide emotional support.     Medical Summary Current Status: meningioima s/p resection with left hemiparesis, lower ext in particular. coping issues, hx of depression.  Barriers to Discharge: Medical stability   Possible Resolutions to [name] Focus: orthotics, patient education, ego support. daily assessment of labs and pt data   Continued Need for Acute Rehabilitation Level of Care: The patient requires daily medical management by a physician with specialized training in physical medicine and rehabilitation for the following reasons: Direction of a multidisciplinary physical rehabilitation program to maximize functional independence : Yes Medical management of patient stability for increased activity during participation in an  intensive rehabilitation regime.: Yes Analysis of laboratory values and/or radiology reports with any subsequent need for medication adjustment and/or medical intervention. : Yes   I attest that I was present, lead the team conference, and concur with the assessment and plan of the  team.   Cristi Loron 10/07/2020, 4:56 PM

## 2020-10-07 NOTE — Progress Notes (Signed)
Occupational Therapy Session Note  Patient Details  Name: Brent Harrington MRN: [id number] Date of Birth: 17-Nov-1950  Today's Date: 10/07/2020 Session 1 OT Individual Time: 1000-1027 OT Individual Time Calculation (min): 27 min   Session 2 OT Individual Time: 0623-7628 OT Individual Time Calculation (min): 55 min    Short Term Goals: Week 1:  OT Short Term Goal 1 (Week 1): Patient will complete toilet transfer with mod A OT Short Term Goal 2 (Week 1): Patient will complete sit<>stand with mod A OT Short Term Goal 3 (Week 1): Pt will use L UE within bathing tasks with min verbal cues  Skilled Therapeutic Interventions/Progress Updates:    Session 1 Pt greeted seated in wc and agreeable to OT treatment session. Pt needed OT assist to don L AFO, but was otherwise dressed. OT educated on OT role for progression with BADLs and pt agreeable to shower this afternoon. Pt propelled wc to therapy gym. Worked on sit<> stands and LLE NMR with weight bearing in standing. Focus on glute and quad activation. 3 sets of 10 squats and assisted knee flex/ext. Pt returned to room and left seated in wc with alarm belt on, call bell in reach, and needs met.  Session 2 Pt greeted seated in wc with sister present and agreeable to OT treatment session focused on self-care retraining at shower level. Worked on squat-pivot transfer into shower  with mod A. Bathing completed from shower bench with cutout. OT assist to wash lower legs. Max A for squat-pivot out of shower towards weaker L side.Worked on LB dressing strategies using reacher and crossing R LE under L to lift toe up to thread pant legs. Still needed OT Assist to get them all the way on. Pt was able to don R sock, but needed OT assist to don L. SiT<>stand using grab bar in bathroom with mod A, then worked on alternating UE support to pull pants up over hips. Pt propelled wc to therapy gym and woked on sit<>stands, weight shifting onto L LE and alternating UE  support to collect sticky note numbers on wall. Pt propelled wc back to room and left seated in wc with alarm belt on and call bell in reach,   Therapy Documentation Precautions:  Precautions Precautions: Fall Precaution Comments: left hemi Restrictions Weight Bearing Restrictions: No Pain:      Therapy/Group: Individual Therapy  Valma Cava 10/07/2020, 3:10 PM

## 2020-10-08 NOTE — Progress Notes (Signed)
PROGRESS NOTE   Subjective/Complaints: Having some cramping at night. Otherwise happy that he's seeing some return in leg.   ROS: Patient denies fever, rash, sore throat, blurred vision, nausea, vomiting, diarrhea, cough, shortness of breath or chest pain, joint or back pain, headache, or mood change.      Objective:   No results found. Recent Labs    10/06/20 0831  WBC 15.7*  HGB 15.1  HCT 45.7  PLT 232   Recent Labs    10/06/20 0831  NA 136  K 3.7  CL 101  CO2 28  GLUCOSE 71  BUN 24*  CREATININE 1.28*  CALCIUM 8.8*     Intake/Output Summary (Last 24 hours) at 10/08/2020 0843 Last data filed at 10/08/2020 0502 Gross per 24 hour  Intake 118 ml  Output 725 ml  Net -607 ml        Physical Exam: Vital Signs Blood pressure 112/75, pulse 67, temperature 97.8 F (36.6 C), resp. rate 20, height 5\' 11"  (1.803 m), weight 95.1 kg, SpO2 96 %.    Constitutional: No distress . Vital signs reviewed. HEENT: EOMI, oral membranes moist Neck: supple Cardiovascular: RRR without murmur. No JVD    Respiratory/Chest: CTA Bilaterally without wheezes or rales. Normal effort    GI/Abdomen: BS +, non-tender, non-distended Ext: no clubbing, cyanosis, or edema Psych: pleasant and cooperative  Skin: Clean and intact without signs of breakdown. Incison, staples intact Neuro: Pt is cognitively appropriate with normal insight, memory, and awareness. Cranial nerves 2-12 are intact. LLE with sl diminished LT. LUE touch only minimally diminished..   No tremors. Motor function is grossly 5/5 on right. LUE 4 to 4+/5. LLE 1+ to 2/5 HF, HAD,HAB, 2/5 KE, and 0 trc/5 at  ankle Musculoskeletal: Full ROM, No pain with AROM or PROM in the neck, trunk, or extremities. Posture appropriate . Left heel cord a little tight   Assessment/Plan: 1. Functional deficits which require 3+ hours per day of interdisciplinary therapy in a comprehensive  inpatient rehab setting. Physiatrist is providing close team supervision and 24 hour management of active medical problems listed below. Physiatrist and rehab team continue to assess barriers to discharge/monitor patient progress toward functional and medical goals  Care Tool:  Bathing    Body parts bathed by patient: Right arm   Body parts bathed by helper: Left arm     Bathing assist Assist Level: Moderate Assistance - Patient 50 - 74%     Upper Body Dressing/Undressing Upper body dressing   What is the patient wearing?: Pull over shirt    Upper body assist Assist Level: Supervision/Verbal cueing    Lower Body Dressing/Undressing Lower body dressing      What is the patient wearing?: Pants     Lower body assist Assist for lower body dressing: Moderate Assistance - Patient 50 - 74%     Toileting Toileting    Toileting assist Assist for toileting: Moderate Assistance - Patient 50 - 74%     Transfers Chair/bed transfer  Transfers assist     Chair/bed transfer assist level: Minimal Assistance - Patient > 75%     Locomotion Ambulation   Ambulation assist  Assist level: Moderate Assistance - Patient 50 - 74% Assistive device: Parallel bars Max distance: 5 ft   Walk 10 feet activity   Assist     Assist level: 2 helpers Assistive device: Hand held assist   Walk 50 feet activity   Assist Walk 50 feet with 2 turns activity did not occur: Safety/medical concerns         Walk 150 feet activity   Assist Walk 150 feet activity did not occur: Safety/medical concerns         Walk 10 feet on uneven surface  activity   Assist Walk 10 feet on uneven surfaces activity did not occur: Safety/medical concerns         Wheelchair     Assist Will patient use wheelchair at discharge?: No Type of Wheelchair: Manual    Wheelchair assist level: Supervision/Verbal cueing Max wheelchair distance: 120 ft    Wheelchair 50 feet with 2  turns activity    Assist        Assist Level: Supervision/Verbal cueing   Wheelchair 150 feet activity     Assist          Blood pressure 112/75, pulse 67, temperature 97.8 F (36.6 C), resp. rate 20, height 5\' 11"  (1.803 m), weight 95.1 kg, SpO2 96 %.  Medical Problem List and Plan: 1.  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  Antithrombotics: -DVT/anticoagulation:  Pharmaceutical: Heparin             -antiplatelet therapy: N/A 3. Pain Management: Tylenol as needed  7/17- added Robaxin prn and Magnesium 40 mg BID for muscle spasms-    7/20 pain generally controlled. Discussed reason for spasms 4. Mood: LCSW to follow for evaluation and support             -antipsychotic agents: N/AA 5. Neuropsych: This patient is capable of making decisions on his own behalf. 6. Skin/Wound Care: Remove staples today 7. Fluids/Electrolytes/Nutrition: encourage PO Recent BUN elevated---sl improved 7/18---continue push fluids -recheck bmet Thursday 8.  Cerebral edema:   Decadron taper to off 9. Leukocytosis likely reactive/steroid related  -down to 15.7 7/18---no s/s infection 10.  History of depression: Continue Lexapro  -improving affect as we're seeing neuro/fxnl gains  -team needs to keep providing + reinforcement  -consider neuropsych eval 11.  Left focal motor seizure w/aura: ?Reports that he still continues to have seizures every 2-3 weeks/last a couple of days PTA --continue Keppra 750 mg twice daily. No sz activity seen      LOS: 7 days A FACE TO FACE EVALUATION WAS PERFORMED  Meredith Staggers 10/08/2020, 8:43 AM

## 2020-10-08 NOTE — Progress Notes (Signed)
Occupational Therapy Session Note  Patient Details  Name: Brent Harrington MRN: [id number] Date of Birth: 08-Dec-1950  Today's Date: 10/08/2020 OT Individual Time: 0488-8916 OT Individual Time Calculation (min): 45 min   Session 2: OT Individual Time: 1345-1430 OT Individual Time Calculation (min): 45 min    Short Term Goals: Week 1:  OT Short Term Goal 1 (Week 1): Patient will complete toilet transfer with mod A OT Short Term Goal 2 (Week 1): Patient will complete sit<>stand with mod A OT Short Term Goal 3 (Week 1): Pt will use L UE within bathing tasks with min verbal cues   Skilled Therapeutic Interventions/Progress Updates:    Pt received sitting in the w/ c with no c/o pain, eager to start session. Pt completed w/c propulsion to the therapy gym, 100 ft with supervision. Pt requires min-mod cueing for attention to LLE and to ensure safety of the leg. PROM applied to pt's L heel cord with knee extended- pt reporting this feels great and he can feel stretch through hamstring as well. Pt completed sit > stand in the parallel bars, wearing L AFO, and worked on weightbearing with quad/hamstring control. Cueing/tactile input for muscle activation and technique, as well as to minimize UE reliance. Pt was then strapped into the maxi sky walking sling where he completed 10 ft forward and backward ambulation. Sling taking off approximately ~25% of body weight and pt bearing weight through RW. He required total A for LLE advancement forward and only mod A for hip extension when going backward. Pt returned to his room and was left sitting up in the w/c with the chair alarm belt fastened.   Session 2: Pt in w/c with no c/o pain, agreeable to session. In the therapy gym pt required min A for squat pivot transfer to the mat. He required mod A to transfer into quadruped on the mat. Once in this position he was able to maintain with CGA. He completed various core stabilization and dynamic reaching tasks.  Active stretching into hip and knee flexion with child's pose. He transitioned into sidelying with posterior support to reduce trunk compensations as he completed gravity eliminated AAROM hip flexion/extension and knee flexion/extension. He required total A for knee flexion and min A for flexion, able to use extensor tone to come into full LE extension. He returned to [facility] with CGA and then completed squat pivot with mod A, narrowing landing on edge of w/c. Edu provided re safety and impulsivity. Pt returned to his room and was left sitting up in the w/c with the chair alarm belt fastened.   Therapy Documentation Precautions:  Precautions Precautions: Fall Precaution Comments: left hemi Restrictions Weight Bearing Restrictions: No  Therapy/Group: Individual Therapy  Curtis Sites 10/08/2020, 6:31 AM

## 2020-10-08 NOTE — Progress Notes (Signed)
Staples removed per md order.  Pt toleratd well.

## 2020-10-08 NOTE — Plan of Care (Signed)
  Problem: Consults Goal: RH BRAIN INJURY PATIENT EDUCATION Description: Description: See Patient Education module for eduction specifics Outcome: Progressing Goal: Skin Care Protocol Initiated - if Braden Score 18 or less Description: If consults are not indicated, leave blank or document N/A Outcome: Progressing   Problem: RH SKIN INTEGRITY Goal: RH STG ABLE TO PERFORM INCISION/WOUND CARE W/ASSISTANCE Description: STG Able To Perform Incision/Wound Care With min Assistance. Outcome: Progressing   Problem: RH SAFETY Goal: RH STG ADHERE TO SAFETY PRECAUTIONS W/ASSISTANCE/DEVICE Description: STG Adhere to Safety Precautions With min Assistance/Device. Outcome: Progressing   Problem: RH COGNITION-NURSING Goal: RH STG ANTICIPATES NEEDS/CALLS FOR ASSIST W/ASSIST/CUES Description: STG Anticipates Needs/Calls for Assist With Cues and Reminders. Outcome: Progressing   Problem: RH KNOWLEDGE DEFICIT BRAIN INJURY Goal: RH STG INCREASE KNOWLEDGE OF SELF CARE AFTER BRAIN INJURY Description: Patient will be able to demonstrate knowledge of medication management, skin/wound care, and safety awareness with educational materials and handouts provided by staff independently at discharge. Outcome: Progressing

## 2020-10-08 NOTE — Progress Notes (Signed)
Speech Language Pathology Daily Session Note  Patient Details  Name: Nivaan Dicenzo MRN: [id number] Date of Birth: 15-Feb-1951  Today's Date: 10/08/2020 SLP Individual Time: 0825-0920 SLP Individual Time Calculation (min): 55 min  Short Term Goals: Week 1: SLP Short Term Goal 1 (Week 1): Patient will utilize call bell to request assistance in 75% of opportunities with Mod verbal cues. SLP Short Term Goal 2 (Week 1): Patient will demonstrate functional problem solving for functional and mildly complex tasks with Min verbal cues. SLP Short Term Goal 3 (Week 1): Patient will self-monitor and correct errors during mildly complex tasks with Min verbal cues. SLP Short Term Goal 4 (Week 1): Patient will recall daily, functional information with Min verbal and visual cues. SLP Short Term Goal 5 (Week 1): Patient will demonstrate selective attention in a mildly distracting enviornment for 30 minutes with Min verbal cues for redirection.  Skilled Therapeutic Interventions: Skilled treatment session focused on cognitive goals. SLP facilitated session by providing overall Min A verbal cues for problem solving and selective attention during a navigation task. Patient required encouragement to utilize strategies like asking for assistance or using signs to locate specific areas. However, patient independently wrote down the places he needed to locate to maximize recall. Patient demonstrate selective attention to task for 35 minutes with Min verbal cues for redirection due to verbosity. Patient with decreased attention to left field of environment requiring Min A verbal cues to avoid obstacles. Patient left upright in wheelchair with alarm on and all needs within reach. Continue with current plan of care.      Pain No/Denies Pain   Therapy/Group: Individual Therapy  Nivek Powley 10/08/2020, 9:22 AM

## 2020-10-08 NOTE — Progress Notes (Signed)
Physical Therapy Session Note  Patient Details  Name: Brent Harrington MRN: [id number] Date of Birth: Jul 22, 1950  Today's Date: 10/08/2020 PT Individual Time: 1105-1205 PT Individual Time Calculation (min): 60 min   Short Term Goals: Week 1:  PT Short Term Goal 1 (Week 1): Pt will perform bed mobility with minA. PT Short Term Goal 2 (Week 1): Pt will perform sit to stand transfer with modA. PT Short Term Goal 3 (Week 1): Pt will perform bed to chair transfer with modA. PT Short Term Goal 4 (Week 1): Pt will ambulate x50' with modA +1 and LRAD. Week 2:    Week 3:     Skilled Therapeutic Interventions/Progress Updates:  Pt oob in wc.  Propels to gym w/supervision >119ft w/bilat UES, can be impulsive and requires cues at times for safety. stand pivot transfer wc to mat w/step by step cueing to slow pace, move safely, sequencing. In sitting: Quad activation facilitated w/tapping to quads w/approx [id number] of AROM elicited consistently w/quood global quad activation but inhibited by hamstring tightness/tone. Hamstring stretching performed LLE w/cues/education for pt to stay still during stretch and not to overstretch, education regarding reflexive tightning when perfomed in excess. Repeated quads following stretching w/similar AROM/quality contraction  Sit to stand, turn, tall kneeling via "crawling approach to mat" w/max assist of 2, max cueing for attention to task, safety, sequencing.  LLE placed on mat first in effort to limit extensor tone. Pt uses blue stool for assist in stabilizing in tall kneeling, initially relies heavily on UE support and min assist.   Pt able to progress to alternating arm raises then dual arm raises w/overall min assist for stabilization. Tall kneeling to sitting on haunches repeated for glut/quad/core activation.  Tall kneeling to stand to sit w/max assist of 2 for safety, cues as above due to implusivity.  In parallel bars: Worked on static stand w/miline  posture via forward rotation/protraction of L hip and shoulder, exaggerated rotation and return to midline w/empahsis on not overshooting back into retraction.  Gait x 46ft w/total assist to advance, stabiliz LLE, cues for attention to task/safety/slowed pace and hip/trunk rotation as above. Retrogait 42ft, as above.  At hallway rail w/wc follow: Gait 79ft w/max assist to advance LLE, stabilize knee, L AFO, cues for slowed pace/focus on task/focus on sequencing.  Relies heavily on R handrail.  Propels wc back to room.  Pt left oob in wc w/alarm belt set and needs in reach      Therapy Documentation Precautions:  Precautions Precautions: Fall Precaution Comments: left hemi Restrictions Weight Bearing Restrictions: No     Therapy/Group: Individual Therapy Callie Fielding, [place] 10/08/2020, 12:31 PM

## 2020-10-09 NOTE — Progress Notes (Signed)
Speech Language Pathology Weekly Progress and Session Note  Patient Details  Name: Tereso Unangst MRN: [id number] Date of Birth: [birth year]  Beginning of progress report period: October 01, 2020 End of progress report period: October 09, 2020  Today's Date: 10/09/2020 SLP Individual Time: 6387-5643 SLP Individual Time Calculation (min): 55 min  Short Term Goals: Week 1: SLP Short Term Goal 1 (Week 1): Patient will utilize call bell to request assistance in 75% of opportunities with Mod verbal cues. SLP Short Term Goal 1 - Progress (Week 1): Met SLP Short Term Goal 2 (Week 1): Patient will demonstrate functional problem solving for functional and mildly complex tasks with Min verbal cues. SLP Short Term Goal 2 - Progress (Week 1): Met SLP Short Term Goal 3 (Week 1): Patient will self-monitor and correct errors during mildly complex tasks with Min verbal cues. SLP Short Term Goal 3 - Progress (Week 1): Met SLP Short Term Goal 4 (Week 1): Patient will recall daily, functional information with Min verbal and visual cues. SLP Short Term Goal 4 - Progress (Week 1): Met SLP Short Term Goal 5 (Week 1): Patient will demonstrate selective attention in a mildly distracting enviornment for 30 minutes with Min verbal cues for redirection. SLP Short Term Goal 5 - Progress (Week 1): Met    New Short Term Goals: Week 2: SLP Short Term Goal 1 (Week 2): Patient will demonstrate selective attention in a mildly distracting enviornment for 45 minutes with supervision verbal cues for redirection. SLP Short Term Goal 2 (Week 2): Patient will recall daily, functional information with Supervision verbal and visual cues. SLP Short Term Goal 3 (Week 2): Patient will self-monitor and correct errors during mildly complex tasks with Supervision verbal cues. SLP Short Term Goal 4 (Week 2): Patient will demonstrate functional problem solving for functional and mildly complex tasks with Supervision verbal cues.  Weekly  Progress Updates: Patient has made excellent gains and has met 5 of 5 STGs this reporting period. Currently, patient requires overall supervision-Min A verbal cues to completed functional and mildly complex tasks safely in regards to selective attention, attention to left field of environment, problem solving, awareness and recall. Patient and family education ongoing. Patient would benefit from continued skilled SLP intervention to maximize his cognitive functioning and overall functional independence prior to discharge. Based on patient's progress, his care plan has been changed to 1-3X/week for SLP. Patient in agreement.      Intensity: Minumum of 1-2 x/day, 30 to 90 minutes Frequency: 1 to 3 out of 7 days Duration/Length of Stay: 10/22/20 Treatment/Interventions: Cognitive remediation/compensation;Internal/external aids;Therapeutic Activities;Environmental controls;Cueing hierarchy;Functional tasks;Patient/family education   Daily Session  Skilled Therapeutic Interventions:  Skilled treatment session focused on cognitive goals. SLP facilitated session by providing extra time and supervision verbal cues for problem solving while utilizing his cell phone to cancel an upcoming doctor's appointment.  However, patient was Mod I for use of memory compensatory strategy of writing the information down to maximize recall. Patient also independently recalled topics of conversation for physician with a 10 minute delay. SLP also facilitated session by re-administering the [facility] Mental Status Examination (SLUMS). Patient patient scored 27/30 points with a score of 27 or above considered normal. Although patient scored WFL on the standardized assessment, patient continues to demonstrate decreased recall of functional information. Educated patient on decreasing plan of care to 3X/week. Patient verbalized understanding and agreement. Patient left upright in wheelchair with alarm on and all needs  within reach. Continue with current plan  of care.     Pain No/Denies Pain   Therapy/Group: Individual Therapy  Haruo Stepanek 10/09/2020, 6:17 AM

## 2020-10-09 NOTE — Progress Notes (Signed)
PROGRESS NOTE   Subjective/Complaints: No new issues today. Pain controlled. Staples out without issue.   ROS: Patient denies fever, rash, sore throat, blurred vision, nausea, vomiting, diarrhea, cough, shortness of breath or chest pain, joint or back pain, headache, or mood change.      Objective:   No results found. No results for input(s): WBC, HGB, HCT, PLT in the last 72 hours.  No results for input(s): NA, K, CL, CO2, GLUCOSE, BUN, CREATININE, CALCIUM in the last 72 hours.    Intake/Output Summary (Last 24 hours) at 10/09/2020 1032 Last data filed at 10/08/2020 1740 Gross per 24 hour  Intake 354 ml  Output --  Net 354 ml        Physical Exam: Vital Signs Blood pressure 112/70, pulse 62, temperature 97.8 F (36.6 C), temperature source Oral, resp. rate 18, height 5\' 11"  (1.803 m), weight 95.1 kg, SpO2 98 %.    Constitutional: No distress . Vital signs reviewed. HEENT: EOMI, oral membranes moist Neck: supple Cardiovascular: RRR without murmur. No JVD    Respiratory/Chest: CTA Bilaterally without wheezes or rales. Normal effort    GI/Abdomen: BS +, non-tender, non-distended Ext: no clubbing, cyanosis, or edema Psych: pleasant and cooperative  Skin: staples out. ICDI Neuro: Pt is cognitively appropriate with normal insight, memory, and awareness. Cranial nerves 2-12 are intact. LLE with sl diminished LT. LUE touch only minimally diminished..   No tremors. Motor function is grossly 5/5 on right. LUE 4 to 4+/5. LLE 1+ to 2/5 HF, HAD,HAB, 2+/5 KE, and 0 trc/5 at  ankle-  Musculoskeletal: Full ROM, No pain with AROM or PROM in the neck, trunk, or extremities. Posture appropriate . Left heel cord a little tight, but better today   Assessment/Plan: 1. Functional deficits which require 3+ hours per day of interdisciplinary therapy in a comprehensive inpatient rehab setting. Physiatrist is providing close team  supervision and 24 hour management of active medical problems listed below. Physiatrist and rehab team continue to assess barriers to discharge/monitor patient progress toward functional and medical goals  Care Tool:  Bathing    Body parts bathed by patient: Right arm   Body parts bathed by helper: Left arm     Bathing assist Assist Level: Moderate Assistance - Patient 50 - 74%     Upper Body Dressing/Undressing Upper body dressing   What is the patient wearing?: Pull over shirt    Upper body assist Assist Level: Supervision/Verbal cueing    Lower Body Dressing/Undressing Lower body dressing      What is the patient wearing?: Pants     Lower body assist Assist for lower body dressing: Moderate Assistance - Patient 50 - 74%     Toileting Toileting    Toileting assist Assist for toileting: Moderate Assistance - Patient 50 - 74%     Transfers Chair/bed transfer  Transfers assist     Chair/bed transfer assist level: Minimal Assistance - Patient > 75%     Locomotion Ambulation   Ambulation assist      Assist level: Maximal Assistance - Patient 25 - 49% Assistive device: Other (comment) (hallway rail R) Max distance: 20   Walk 10 feet activity  Assist     Assist level: Maximal Assistance - Patient 25 - 49% Assistive device: Other (comment) (hallway rail R)   Walk 50 feet activity   Assist Walk 50 feet with 2 turns activity did not occur: Safety/medical concerns         Walk 150 feet activity   Assist Walk 150 feet activity did not occur: Safety/medical concerns         Walk 10 feet on uneven surface  activity   Assist Walk 10 feet on uneven surfaces activity did not occur: Safety/medical concerns         Wheelchair     Assist Will patient use wheelchair at discharge?: No Type of Wheelchair: Manual    Wheelchair assist level: Supervision/Verbal cueing Max wheelchair distance: 120 ft    Wheelchair 50 feet with 2  turns activity    Assist        Assist Level: Supervision/Verbal cueing   Wheelchair 150 feet activity     Assist          Blood pressure 112/70, pulse 62, temperature 97.8 F (36.6 C), temperature source Oral, resp. rate 18, height 5\' 11"  (1.803 m), weight 95.1 kg, SpO2 98 %.  Medical Problem List and Plan: 1.  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  Antithrombotics: -DVT/anticoagulation:  Pharmaceutical: Heparin             -antiplatelet therapy: N/A 3. Pain Management: Tylenol as needed  7/17- added Robaxin prn and Magnesium 40 mg BID for muscle spasms-    7/20 pain generally controlled. Discussed reason for spasms 4. Mood: LCSW to follow for evaluation and support             -antipsychotic agents: N/AA 5. Neuropsych: This patient is capable of making decisions on his own behalf. 6. Skin/Wound Care: Remove staples today 7. Fluids/Electrolytes/Nutrition: encourage PO Recent BUN elevated---sl improved 7/18---continue push fluids -f/u labs as available 8.  Cerebral edema:   Decadron taper to off 9. Leukocytosis likely reactive/steroid related  -down to 15.7 7/18---no s/s infection 10.  History of depression: Continue Lexapro  -affect has improved as he's made functional gains and as he's gotten to know team.   -team needs to keep providing + reinforcement  -consider neuropsych eval 11.  Left focal motor seizure w/aura: ?Reports that he still continues to have seizures every 2-3 weeks/last a couple of days PTA --continue Keppra 750 mg twice daily. No sz activity seen      LOS: 8 days A FACE TO FACE EVALUATION WAS PERFORMED  Meredith Staggers 10/09/2020, 10:32 AM

## 2020-10-09 NOTE — Progress Notes (Signed)
Physical Therapy Weekly Progress Note  Patient Details  Name: Brent Harrington MRN: [id number] Date of Birth: 02-Jul-1950  Beginning of progress report period: October 02, 2020 End of progress report period: October 09, 2020  Today's Date: 10/09/2020 PT Individual Time: 4497-5300 PT Individual Time Calculation (min): 60 min   Patient has met 3 of 4 short term goals.  Pt is progressing toward mobility goals, improving independence with bed mobility, balance, and transfers. Pt has also shown improvements in L lower extremity muscle activation, especially in extensor pattern. L lower extremity flexion has been slower to progress and is limiting pt's progress toward ambulation goals. Pt also continues to have impulsive tendencies and requires frequent cueing for safety and sequencing of mobility tasks. Coming week to focus on continued NMR of L hemibody, balance, ambulation, and stair training.   Patient continues to demonstrate the following deficits muscle weakness, decreased cardiorespiratoy endurance, impaired timing and sequencing, abnormal tone, unbalanced muscle activation, motor apraxia, decreased coordination, and decreased motor planning, and decreased sitting balance, decreased standing balance, decreased postural control, hemiplegia, and decreased balance strategies and therefore will continue to benefit from skilled PT intervention to increase functional independence with mobility.  Patient progressing toward long term goals..  Continue plan of care.  PT Short Term Goals Week 1:  PT Short Term Goal 1 (Week 1): Pt will perform bed mobility with minA. PT Short Term Goal 1 - Progress (Week 1): Met PT Short Term Goal 2 (Week 1): Pt will perform sit to stand transfer with modA. PT Short Term Goal 2 - Progress (Week 1): Met PT Short Term Goal 3 (Week 1): Pt will perform bed to chair transfer with modA. PT Short Term Goal 3 - Progress (Week 1): Met PT Short Term Goal 4 (Week 1): Pt will ambulate  x50' with modA +1 and LRAD. PT Short Term Goal 4 - Progress (Week 1): Progressing toward goal Week 2:  PT Short Term Goal 1 (Week 2): Pt will perform bed mobility with CGA. PT Short Term Goal 2 (Week 2): Pt will perform stand pivot transfer with minA. PT Short Term Goal 3 (Week 2): Pt will ambulate x50' with modA +1 and LRAD. PT Short Term Goal 4 (Week 2): Pt will initiate stair training.  Skilled Therapeutic Interventions/Progress Updates:  Ambulation/gait training;Community reintegration;DME/adaptive equipment instruction;Neuromuscular re-education;Psychosocial support;Stair training;UE/LE Strength taining/ROM;Wheelchair propulsion/positioning;Balance/vestibular training;Discharge planning;Functional electrical stimulation;Pain management;Skin care/wound management;Therapeutic Activities;UE/LE Coordination activities;Cognitive remediation/compensation;Functional mobility training;Disease management/prevention;Patient/family education;Splinting/orthotics;Therapeutic Exercise;Visual/perceptual remediation/compensation   Pt received seated in WC and agrees to therapy. No complaint of pain. Pt self propels WC x100' to therapy gym with cues for L upper extremity propulsion technique for improved efficiency and safety. Pt transfers to high kneeling position on mat table. Initially pt attempts with +1 assist, facing mat and leading with R leg. With this technique pt experiences extensor tone in L leg and requires max A+2 to return safely to [facility] as he essentially falls forward onto mat on stomach and beings sliding toward edge of mat. Following seated rest break, pt performs again, but this time leads with L leg in attempt to prevent L extensor tone. Pt able to "crawl" onto mat with this technique and modA +2. In high kneeling with hands on platform, pt performs alternating hand raises x10, bilateral hand raises 2x10, trunk rotations holding onto 5 lb medicine ball with both hands and arms extended, moving in  diagonal pattern in for multiplanar movement 2x10, and performing bilateral arm raises 1x10. Pt perform x10 hip extensions  into tall kneeling then lowering down with buttocks resting on calves, x10. Pt transitions to quadruped with platform under trunk for support. Pt completes 2x10 leg extensions with alternating legs, with PT providing AAROM and multimodal cueing for anterior and lateral weight shifting. Pt assisted back to WC with modA +1. WC transport to dayroom for time management. Pt transfer to Nustep with stand pivot and modA +1. Pt completes x10:00 total on Nustep to work on strength, endurance, and reciprocal coordination moving outside of synergistic patterns. PT provides tactile facilitation of L hip internal rotation and adduction, though pt is able to engage adductors slightly, but fatigues quickly. Pt performs at workload of 4 with steps per minute ~30. Stand pivot back to WC with modA. Left with alarm intact and all needs within reach.  Therapy Documentation Precautions:  Precautions Precautions: Fall Precaution Comments: left hemi Restrictions Weight Bearing Restrictions: No   Therapy/Group: Individual Therapy  Breck Coons, PT, DPT 10/09/2020, 12:46 PM

## 2020-10-09 NOTE — Progress Notes (Signed)
Occupational Therapy Weekly Progress Note  Patient Details  Name: Brent Harrington MRN: [id number] Date of Birth: 24-Oct-1950  Beginning of progress report period: October 02, 2020 End of progress report period: October 09, 2020  Today's Date: 10/09/2020 OT Individual Time: 0919-8022 OT Individual Time Calculation (min): 59 min   Patient has met 3 of 3 short term goals.  Patient is making steady progress towards OT goals. LLE  function has been steadily improving and has been helpful within BADL tasks. Mod A overall for transfers and sit<>stands during functional tasks. Continue current POC.   Patient continues to demonstrate the following deficits: muscle weakness, decreased cardiorespiratoy endurance, and decreased standing balance, hemiplegia, and decreased balance strategies and therefore will continue to benefit from skilled OT intervention to enhance overall performance with BADL.  Patient progressing toward long term goals..  Continue plan of care.  OT Short Term Goals Week 1:  OT Short Term Goal 1 (Week 1): Patient will complete toilet transfer with mod A OT Short Term Goal 1 - Progress (Week 1): Met OT Short Term Goal 2 (Week 1): Patient will complete sit<>stand with mod A OT Short Term Goal 2 - Progress (Week 1): Met OT Short Term Goal 3 (Week 1): Pt will use L UE within bathing tasks with min verbal cues OT Short Term Goal 3 - Progress (Week 1): Met Week 2:  OT Short Term Goal 1 (Week 2): Patient will complete toilet transfer with min A OT Short Term Goal 2 (Week 2): Patient will complete sit<>stand with miN A OT Short Term Goal 3 (Week 2): Patient will thread L LE through pant leg with AE as needed  Skilled Therapeutic Interventions/Progress Updates:    Pt greeted seated in wc and agreeable to OT treatment session. Pt propelled wc to therapy gym and worked on ambulation forward and backwward with focus on weight shifting and hip/knee extension with stepping. Standing balance/endurance  with standing checkers activity. Pt tolerated standing for 10 minutes with CGA. Pt propelled wc back to room and bathing/dressing completed sit<>stand from wc at the sink. Worked on problem solving threading LLE into pants using reacher. Sit<>stands with min A and L knee block, pt was then able to pull up pants. Pt left seated in wc at end of session with alarm belt on and needs met.   Therapy Documentation Precautions:  Precautions Precautions: Fall Precaution Comments: left hemi Restrictions Weight Bearing Restrictions: No Pain: Pain Assessment Pain Scale: 0-10 Pain Score: 0-No pain   Therapy/Group: Individual Therapy  Valma Cava 10/09/2020, 9:57 AM

## 2020-10-10 LAB — BASIC METABOLIC PANEL
Anion gap: 8 (ref 5–15)
BUN: 18 mg/dL (ref 8–23)
CO2: 27 mmol/L (ref 22–32)
Calcium: 9.1 mg/dL (ref 8.9–10.3)
Chloride: 101 mmol/L (ref 98–111)
Creatinine, Ser: 1.22 mg/dL (ref 0.61–1.24)
GFR, Estimated: >60 mL/min (ref >60)
Glucose, Bld: 98 mg/dL (ref 70–99)
Potassium: 4.2 mmol/L (ref 3.5–5.1)
Sodium: 136 mmol/L (ref 135–145)

## 2020-10-10 MED ORDER — BACLOFEN 10 MG PO TABS
10.0000 mg | ORAL_TABLET | Freq: Every day | ORAL | Status: DC
  Administered 2020-10-10 – 2020-10-12 (×3): 10 mg via ORAL
  Filled 2020-10-10 (×3): qty 1

## 2020-10-10 NOTE — Progress Notes (Signed)
PROGRESS NOTE   Subjective/Complaints: Overall he feels good! Still having spasms at night in lower left leg. Tolerating AFO.  ROS: Patient denies fever, rash, sore throat, blurred vision, nausea, vomiting, diarrhea, cough, shortness of breath or chest pain, joint or back pain, headache, or mood change.      Objective:   No results found. No results for input(s): WBC, HGB, HCT, PLT in the last 72 hours.  Recent Labs    10/10/20 0634  NA 136  K 4.2  CL 101  CO2 27  GLUCOSE 98  BUN 18  CREATININE 1.22  CALCIUM 9.1      Intake/Output Summary (Last 24 hours) at 10/10/2020 1158 Last data filed at 10/10/2020 0718 Gross per 24 hour  Intake 118 ml  Output 400 ml  Net -282 ml        Physical Exam: Vital Signs Blood pressure 107/79, pulse 66, temperature (!) 97.5 F (36.4 C), resp. rate 20, height '5\' 11"'$  (1.803 m), weight 95.1 kg, SpO2 97 %.    Constitutional: No distress . Vital signs reviewed. HEENT: EOMI, oral membranes moist Neck: supple Cardiovascular: RRR without murmur. No JVD    Respiratory/Chest: CTA Bilaterally without wheezes or rales. Normal effort    GI/Abdomen: BS +, non-tender, non-distended Ext: no clubbing, cyanosis, or edema Psych: pleasant and cooperative, in good spirits! Neuro: Pt is cognitively appropriate with normal insight, memory, and awareness. Cranial nerves 2-12 are intact. LLE with sl diminished LT. LUE touch only minimally diminished..   No tremors. Motor function is grossly 5/5 on right. LUE 4 to 4+/5. LLE 1+ to 2/5 HF, HAD,HAB, 2+/5 KE, and still 0 trc/5 at  ankle-  Musculoskeletal: Full ROM, No pain with AROM or PROM in the neck, trunk, or extremities. Posture appropriate . Left heel cord tightness improving   Assessment/Plan: 1. Functional deficits which require 3+ hours per day of interdisciplinary therapy in a comprehensive inpatient rehab setting. Physiatrist is providing  close team supervision and 24 hour management of active medical problems listed below. Physiatrist and rehab team continue to assess barriers to discharge/monitor patient progress toward functional and medical goals  Care Tool:  Bathing    Body parts bathed by patient: Right arm   Body parts bathed by helper: Left arm     Bathing assist Assist Level: Moderate Assistance - Patient 50 - 74%     Upper Body Dressing/Undressing Upper body dressing   What is the patient wearing?: Pull over shirt    Upper body assist Assist Level: Supervision/Verbal cueing    Lower Body Dressing/Undressing Lower body dressing      What is the patient wearing?: Pants     Lower body assist Assist for lower body dressing: Moderate Assistance - Patient 50 - 74%     Toileting Toileting    Toileting assist Assist for toileting: Moderate Assistance - Patient 50 - 74%     Transfers Chair/bed transfer  Transfers assist     Chair/bed transfer assist level: Minimal Assistance - Patient > 75%     Locomotion Ambulation   Ambulation assist      Assist level: Maximal Assistance - Patient 25 - 49% Assistive device:  Other (comment) (hallway rail R) Max distance: 20   Walk 10 feet activity   Assist     Assist level: Maximal Assistance - Patient 25 - 49% Assistive device: Other (comment) (hallway rail R)   Walk 50 feet activity   Assist Walk 50 feet with 2 turns activity did not occur: Safety/medical concerns         Walk 150 feet activity   Assist Walk 150 feet activity did not occur: Safety/medical concerns         Walk 10 feet on uneven surface  activity   Assist Walk 10 feet on uneven surfaces activity did not occur: Safety/medical concerns         Wheelchair     Assist Will patient use wheelchair at discharge?: No Type of Wheelchair: Manual    Wheelchair assist level: Supervision/Verbal cueing Max wheelchair distance: 120 ft    Wheelchair 50 feet  with 2 turns activity    Assist        Assist Level: Supervision/Verbal cueing   Wheelchair 150 feet activity     Assist          Blood pressure 107/79, pulse 66, temperature (!) 97.5 F (36.4 C), resp. rate 20, height '5\' 11"'$  (1.803 m), weight 95.1 kg, SpO2 97 %.  Medical Problem List and Plan: 1.  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  Antithrombotics: -DVT/anticoagulation:  Pharmaceutical: Heparin             -antiplatelet therapy: N/A 3. Pain Management: Tylenol as needed  7/17- added Robaxin prn and Magnesium 40 mg BID for muscle spasms-    7/22 will add '10mg'$  baclofen at hs for LLE spasms   -dc robaxin 4. Mood: LCSW to follow for evaluation and support             -antipsychotic agents: N/AA 5. Neuropsych: This patient is capable of making decisions on his own behalf. 6. Skin/Wound Care: Remove staples today 7. Fluids/Electrolytes/Nutrition: encourage PO Recent BUN elevated---sl improved 7/18--  7/22 today's labs reviewed, improved further. Continue to push fluids 8.  Cerebral edema:   Decadron taper to off 9. Leukocytosis likely reactive/steroid related  -down to 15.7 7/18---no s/s infection---check Monday 10.  History of depression: Continue Lexapro  -affect has improved a lot as he's made functional gains and as he's gotten to know team.   -team needs to keep providing + reinforcement  -consider neuropsych eval 11.  Left focal motor seizure w/aura: ?Reports that he still continues to have seizures every 2-3 weeks/last a couple of days PTA --continue Keppra 750 mg twice daily. No sz activity seen      LOS: 9 days A FACE TO FACE EVALUATION WAS PERFORMED  Meredith Staggers 10/10/2020, 11:58 AM

## 2020-10-10 NOTE — Progress Notes (Signed)
Occupational Therapy Session Note  Patient Details  Name: Brent Harrington MRN: [id number] Date of Birth: [birth year]  Today's Date: 10/10/2020 OT Individual Time: 7116-5790 OT Individual Time Calculation (min): 56 min   And group time 1115-1200 45 min group  Short Term Goals: Week 1:  OT Short Term Goal 1 (Week 1): Patient will complete toilet transfer with mod A OT Short Term Goal 1 - Progress (Week 1): Met OT Short Term Goal 2 (Week 1): Patient will complete sit<>stand with mod A OT Short Term Goal 2 - Progress (Week 1): Met OT Short Term Goal 3 (Week 1): Pt will use L UE within bathing tasks with min verbal cues OT Short Term Goal 3 - Progress (Week 1): Met  Skilled Therapeutic Interventions/Progress Updates:     Pt received in w/c with no pain reported  Therapeutic exercise  Seated, supine and sidelying therex as follows Heel slides seated in w/c with pillow case and MAX A for facilitaiton Mini squats 3x10 in standing with parallel bars with MIN A and guarding A for knee stability.  Clam shells 2x10 with MOD- MAX A for abduction and min manual resistance for adduction Bridges 2x10 with adduction/abduction cues   All therex completed to improve LE activation and strength requried for transitional movment during ADLs  Pt left at end of session in w/c with exit alarm on, call light in reach and all needs met   Session 2: Pt participated in rhythmic drumming group. Pain not reported during session. Focus of group on BUE coordination, strengthening, endurance, timing/control, activity tolerance, and social participation and engagement. Pt performs session from seated position for energy conservation. Skilled interventions included grading movements up to improve BUE ROM and strengthening.. Warm up performed prior to exercises and UB stretching completed at end of group with demo from OT. Pt able to select preferred song to share with group. Returned pt to room at end of session.  Exited session with pt seated in w/c, exit alarm on and call light in reach    Therapy Documentation Precautions:  Precautions Precautions: Fall Precaution Comments: left hemi Restrictions Weight Bearing Restrictions: No General:   Vital Signs: Therapy Vitals Temp: (!) 97.5 F (36.4 C) Pulse Rate: 66 Resp: 20 BP: 107/79 Patient Position (if appropriate): Lying Oxygen Therapy SpO2: 97 % O2 Device: Room Air Pain:   ADL: ADL Eating: Set up Grooming: Minimal assistance Upper Body Bathing: Minimal assistance Lower Body Bathing: Maximal assistance Upper Body Dressing: Minimal assistance Lower Body Dressing: Maximal assistance Toileting: Maximal assistance Toilet Transfer: Maximal assistance, Moderate assistance Tub/Shower Transfer: Not assessed Vision   Perception    Praxis   Exercises:   Other Treatments:     Therapy/Group: Individual Therapy  Tonny Branch 10/10/2020, 6:40 AM

## 2020-10-10 NOTE — Progress Notes (Signed)
   10/10/20 1025  Clinical Encounter Type  Visited With Patient  Visit Type Initial  Referral From Nurse  Consult/Referral To Chaplain   Chaplain responded to consult for Advance Directive. Chaplain provided AD education and answered Pt's questions. Chaplain informed Pt that there is no departmental notary until next week. Pt will discuss with his children when they visit later today and let his nurse know when he's ready to sign. Chaplain remains available.  This note was prepared by Chaplain Resident, Dante Gang, MDiv. Chaplain remains available as needed through the on-call pager: [phone].

## 2020-10-10 NOTE — Progress Notes (Signed)
Physical Therapy Session Note  Patient Details  Name: Brent Harrington MRN: [id number] Date of Birth: [birth year]  Today's Date: 10/10/2020 PT Individual Time: 1307-1401 PT Individual Time Calculation (min): 54 min   Short Term Goals: Week 2:  PT Short Term Goal 1 (Week 2): Pt will perform bed mobility with CGA. PT Short Term Goal 2 (Week 2): Pt will perform stand pivot transfer with minA. PT Short Term Goal 3 (Week 2): Pt will ambulate x50' with modA +1 and LRAD. PT Short Term Goal 4 (Week 2): Pt will initiate stair training.  Skilled Therapeutic Interventions/Progress Updates:     Pt received seated in [facility] and agrees to therapy. Pt transported via WC to gym for time management. Initial mobility performed in parallel bars, with pt wearing L AFO. Sit to stand with CGA. Pt ambulates forward and backward x5' with modA and PT providing total management of L lower extremity to progress during swing phase and block knee during stance phase. Backward ambulation performed for balance training as well as movement out of synergistic pattern, with pt presenting with increased extensor tone in L leg. PT provides pt with level 2 therband wrapped around L lower extremity to promote dorsiflexion, knee flexion, and hip flexion. Pt performs 5' forward and backward ambulation again with modA. PT cues to shift hips forward over R foot to facilitate natural dorsiflexion and knee flexion with L leg during terminal stance. Pt then performs ambulation with R hall rail and PT providing assistance with L leg. Pt ambulates forward and backward 2x25' with seated rest break. Mirror provided for visual feedback. Pt cued to perform swing through gait pattern with R leg and has good follow through with cueing.   Pt transfers to mat table with squat pivot to the R and CGA. Sit to supine with minA. Pt performs PNF pattern with L leg to promote motor activation of dorsiflexors, hamstrings, and hip flexors. Pt demonstrates good  activation of extensor groups but still requiring near [place] for movements into flexion. PT provides quick stretch of hip flexors to facilitate contraction as well as verbal and tactile cues through range of motion. Pt performs 1x10 bilateral lower extremity bridges and 1x10 L leg bridges, through is not able to perform full range with single leg. Supine to sit with minA. Squat pivot back to WC with minA. Pt left seated in WC with alarm intact and all needs within reach.  Therapy Documentation Precautions:  Precautions Precautions: Fall Precaution Comments: left hemi Restrictions Weight Bearing Restrictions: No    Therapy/Group: Individual Therapy  Breck Coons, PT, DPT 10/10/2020, 2:25 PM

## 2020-10-10 NOTE — Progress Notes (Signed)
Occupational Therapy Session Note  Patient Details  Name: Brent Harrington MRN: [id number] Date of Birth: 19-Aug-1950  Today's Date: 10/10/2020 OT Individual Time: 1400-1500 OT Individual Time Calculation (min): 60 min    Short Term Goals: Week 2:  OT Short Term Goal 1 (Week 2): Patient will complete toilet transfer with min A OT Short Term Goal 2 (Week 2): Patient will complete sit<>stand with miN A OT Short Term Goal 3 (Week 2): Patient will thread L LE through pant leg with AE as needed   Skilled Therapeutic Interventions/Progress Updates:   Pt received sitting in the w/c with no c/o pain, agreeable to OT session. In the therapy gym pt completed standing level mini squats with mod A, mod facilitation/blocking at the L knee. BUE strengthening circuit with 5 lb dumbbells and resistive level II band. Pt then completed modified crunches on the mat with posterior support and mod cueing for proper TVA activation and core stabilization to prevent excess coning from abdominal pressure. Pt completed squat pivot with min A. He transferred to the NuStep to work on reciprocal stepping and BUE endurance for 8 min at level 6 resistance. Min-mod facilitation for LLE adduction. Pt returned to the w/c and then to his room. He was left sitting up with all needs met.   Therapy Documentation Precautions:  Precautions Precautions: Fall Precaution Comments: left hemi Restrictions Weight Bearing Restrictions: No   Therapy/Group: Individual Therapy  Curtis Sites 10/10/2020, 6:40 AM

## 2020-10-11 NOTE — Progress Notes (Signed)
Occupational Therapy Session Note  Patient Details  Name: Brent Harrington MRN: [id number] Date of Birth: 05-18-1950  Today's Date: 10/11/2020 OT Individual Time: 0700-0759 OT Individual Time Calculation (min): 59 min    Short Term Goals: Week 1:  OT Short Term Goal 1 (Week 1): Patient will complete toilet transfer with mod A OT Short Term Goal 1 - Progress (Week 1): Met OT Short Term Goal 2 (Week 1): Patient will complete sit<>stand with mod A OT Short Term Goal 2 - Progress (Week 1): Met OT Short Term Goal 3 (Week 1): Pt will use L UE within bathing tasks with min verbal cues OT Short Term Goal 3 - Progress (Week 1): Met  Skilled Therapeutic Interventions/Progress Updates:     Pt received in w/c with no pain but second day reporting spasms in RLE, but doesn't remember having this same conversation yesterday. Education on medication and therapy management for smasms/extensor tone  ADL:  Pt completes bathing with spervision seated on TTB with cut out to wash bottom and LHSS to wash BLE and back Pt completes UB dressing with set up seated Pt completes LB dressing with VC for figure 4 use to don (MIN A si to stand at sink for balance) and reacher technique to doff in shower Pt completes footwear with supervision and reacher use to doff with VC and figure 4 to don Pt completes shower/Tub transfer with MIN A using grab bar/stand pivot.   Therapeutic exercise Pt completes squat pivot trasnfers to/from mat table wit CGA overall/MIN A for LE managmeent going to R. Pt educated on bed level figure 4 stretch for prior to getting OOB with sheet adaptation to independently assume position. Pt perseverative on pulsing the stretch and edu re mild resistance and hold stretch position. Pt seated single leg extension stretch with facilitation of extension above knee cap with hip hinge.    Pt left at end of session in w/c with exit alarm on, call light in reach and all needs met   Therapy  Documentation Precautions:  Precautions Precautions: Fall Precaution Comments: left hemi Restrictions Weight Bearing Restrictions: No General:   Vital Signs: Therapy Vitals Temp: (!) 97.5 F (36.4 C) Temp Source: Oral Pulse Rate: 63 Resp: 20 BP: 122/85 Patient Position (if appropriate): Lying Oxygen Therapy SpO2: 95 % O2 Device: Room Air Pain: Pain Assessment Pain Scale: 0-10 Pain Score: 0-No pain ADL: ADL Eating: Set up Grooming: Minimal assistance Upper Body Bathing: Minimal assistance Lower Body Bathing: Maximal assistance Upper Body Dressing: Minimal assistance Lower Body Dressing: Maximal assistance Toileting: Maximal assistance Toilet Transfer: Maximal assistance, Moderate assistance Tub/Shower Transfer: Not assessed Vision   Perception    Praxis   Exercises:   Other Treatments:     Therapy/Group: Individual Therapy  Tonny Branch 10/11/2020, 6:48 AM

## 2020-10-11 NOTE — Progress Notes (Signed)
PROGRESS NOTE   Subjective/Complaints: No issues overnite , feels like LUE back to normal , LLE still week with occ flexor spasms at noc   ROS: Patient denies f chest pain shortness of breath nausea vomiting diarrhea constipation   Objective:   No results found. No results for input(s): WBC, HGB, HCT, PLT in the last 72 hours.  Recent Labs    10/10/20 0634  NA 136  K 4.2  CL 101  CO2 27  GLUCOSE 98  BUN 18  CREATININE 1.22  CALCIUM 9.1       Intake/Output Summary (Last 24 hours) at 10/11/2020 0948 Last data filed at 10/11/2020 0900 Gross per 24 hour  Intake 360 ml  Output 1300 ml  Net -940 ml         Physical Exam: Vital Signs Blood pressure 122/85, pulse 63, temperature (!) 97.5 F (36.4 C), temperature source Oral, resp. rate 20, height '5\' 11"'$  (1.803 m), weight 95.1 kg, SpO2 95 %.     General: No acute distress Mood and affect are appropriate Heart: Regular rate and rhythm no rubs murmurs or extra sounds Lungs: Clear to auscultation, breathing unlabored, no rales or wheezes Abdomen: Positive bowel sounds, soft nontender to palpation, nondistended Extremities: No clubbing, cyanosis, or edema Skin: No evidence of breakdown, no evidence of rash  Neuro: Pt is cognitively appropriate with normal insight, memory, and awareness. Cranial nerves 2-12 are intact. LLE with sl diminished LT. LUE touch only minimally diminished..   No tremors. Motor function is grossly 5/5 on right. LUE 4 to 4+/5. LLE 1+ to 2/5 HF, HAD,HAB, 2+/5 KE, and still 0 trc/5 at  ankle-  Musculoskeletal: Full ROM, No pain with AROM or PROM in the neck, trunk, or extremities. Posture appropriate . Left heel cord tightness improving   Assessment/Plan: 1. Functional deficits which require 3+ hours per day of interdisciplinary therapy in a comprehensive inpatient rehab setting. Physiatrist is providing close team supervision and 24 hour  management of active medical problems listed below. Physiatrist and rehab team continue to assess barriers to discharge/monitor patient progress toward functional and medical goals  Care Tool:  Bathing    Body parts bathed by patient: Right arm   Body parts bathed by helper: Left arm     Bathing assist Assist Level: Moderate Assistance - Patient 50 - 74%     Upper Body Dressing/Undressing Upper body dressing   What is the patient wearing?: Pull over shirt    Upper body assist Assist Level: Supervision/Verbal cueing    Lower Body Dressing/Undressing Lower body dressing      What is the patient wearing?: Pants     Lower body assist Assist for lower body dressing: Moderate Assistance - Patient 50 - 74%     Toileting Toileting    Toileting assist Assist for toileting: Moderate Assistance - Patient 50 - 74%     Transfers Chair/bed transfer  Transfers assist     Chair/bed transfer assist level: Minimal Assistance - Patient > 75%     Locomotion Ambulation   Ambulation assist      Assist level: Maximal Assistance - Patient 25 - 49% Assistive device: Other (comment) (hallway  rail R) Max distance: 20   Walk 10 feet activity   Assist     Assist level: Maximal Assistance - Patient 25 - 49% Assistive device: Other (comment) (hallway rail R)   Walk 50 feet activity   Assist Walk 50 feet with 2 turns activity did not occur: Safety/medical concerns         Walk 150 feet activity   Assist Walk 150 feet activity did not occur: Safety/medical concerns         Walk 10 feet on uneven surface  activity   Assist Walk 10 feet on uneven surfaces activity did not occur: Safety/medical concerns         Wheelchair     Assist Will patient use wheelchair at discharge?: No Type of Wheelchair: Manual    Wheelchair assist level: Supervision/Verbal cueing Max wheelchair distance: 120 ft    Wheelchair 50 feet with 2 turns  activity    Assist        Assist Level: Supervision/Verbal cueing   Wheelchair 150 feet activity     Assist          Blood pressure 122/85, pulse 63, temperature (!) 97.5 F (36.4 C), temperature source Oral, resp. rate 20, height '5\' 11"'$  (1.803 m), weight 95.1 kg, SpO2 95 %.  Medical Problem List and Plan: 1.  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  Antithrombotics: -DVT/anticoagulation:  Pharmaceutical: Heparin             -antiplatelet therapy: N/A 3. Pain Management: Tylenol as needed  7/17- added Robaxin prn and Magnesium 40 mg BID for muscle spasms-    7/22 will add '10mg'$  baclofen at hs for LLE spasms   -dc robaxin 4. Mood: LCSW to follow for evaluation and support             -antipsychotic agents: N/AA 5. Neuropsych: This patient is capable of making decisions on his own behalf. 6. Skin/Wound Care: incison RIgh tcrani looks good small scab but otherwise closed  7. Fluids/Electrolytes/Nutrition: encourage PO Recent BUN elevated---sl improved 7/18--  7/22 today's labs reviewed, improved further. Continue to push fluids 8.  Cerebral edema:   Decadron taper to off 9. Leukocytosis likely reactive/steroid related  -down to 15.7 7/18---no s/s infection---check Monday 10.  History of depression: Continue Lexapro  -affect has improved a lot as he's made functional gains and as he's gotten to know team.   -team needs to keep providing + reinforcement  -consider neuropsych eval 11.  Left focal motor seizure w/aura: ?Reports that he still continues to have seizures every 2-3 weeks/last a couple of days PTA --continue Keppra 750 mg twice daily. No sz activity seen      LOS: 10 days A FACE TO FACE EVALUATION WAS PERFORMED  Charlett Blake 10/11/2020, 9:48 AM

## 2020-10-11 NOTE — Progress Notes (Signed)
Physical Therapy Session Note  Patient Details  Name: Brent Harrington MRN: [id number] Date of Birth: 1950-11-21  Today's Date: 10/11/2020 PT Individual Time: 0830-0900 PT Individual Time Calculation (min): 30 min   Short Term Goals: Week 2:  PT Short Term Goal 1 (Week 2): Pt will perform bed mobility with CGA. PT Short Term Goal 2 (Week 2): Pt will perform stand pivot transfer with minA. PT Short Term Goal 3 (Week 2): Pt will ambulate x50' with modA +1 and LRAD. PT Short Term Goal 4 (Week 2): Pt will initiate stair training.  Skilled Therapeutic Interventions/Progress Updates:    Patient up in w/c in room maneuvering on his own.  Patient assisted to don shoe on L with AFO and able to don shoe on R with S.  Propelled in w/c to dayroom.  Patient transfer to [facility] with min A.  Patient performed 5 min with UE's (intermittent with LE's only) and LE's at level 3.  Patient transferred to w/c then to mat squat pivots with min A.  Patient sit to squat with R foot on step for forced use on L with mod A x 10.  Removed AFO from L foot and demonstrated forward lunges with chair on R for UE support and pt performed with mod A for L foot placement and L LE stability x 5 reps.  Patient transferred to w/c min A and propelled to room with S.  Left in w/c with alarm belt active and all needs in reach.   Therapy Documentation Precautions:  Precautions Precautions: Fall Precaution Comments: left hemi Restrictions Weight Bearing Restrictions: No Pain: Pain Assessment Pain Type: Acute pain Pain Location: Leg Pain Orientation: Right;Upper;Medial Pain Descriptors / Indicators: Tightness Pain Onset: With Activity Pain Intervention(s): Repositioned;Rest    Therapy/Group: Individual Therapy  Reginia Naas Magda Kiel, PT 10/11/2020, 12:33 PM

## 2020-10-12 NOTE — Progress Notes (Signed)
Occupational Therapy Session Note  Patient Details  Name: Brent Harrington MRN: [id number] Date of Birth: 29-Aug-1950  Today's Date: 10/12/2020 OT Individual Time: 1432-1500 OT Individual Time Calculation (min): 28 min    Short Term Goals: Week 2:  OT Short Term Goal 1 (Week 2): Patient will complete toilet transfer with min A OT Short Term Goal 2 (Week 2): Patient will complete sit<>stand with miN A OT Short Term Goal 3 (Week 2): Patient will thread L LE through pant leg with AE as needed  Skilled Therapeutic Interventions/Progress Updates:    Patient seated in w/c, alert and ready for therapy session - he states that he would like to work on motor control of left leg and mobility.  He is able to propel w/c to/from therapy gym.  Completed AROM and light stretch in seated position.  Sit to stand with CGA in parallel bars.  Completed weight shift, reaching, lateral leans, squats, stepping in standing position with and without bilateral hand support on bars.  Ongoing tactile and physical assist to maintain left knee and hip positioning, cues for technique and safety at times.  Overall he was able to stand in parallel bars for majority of session with min A.  He remained seated in w/c at close of session, seat belt alarm on and able to propel chair to reach call bell.    Therapy Documentation Precautions:  Precautions Precautions: Fall Precaution Comments: left hemi Restrictions Weight Bearing Restrictions: No   Therapy/Group: Individual Therapy  Carlos Levering 10/12/2020, 7:41 AM

## 2020-10-13 LAB — CBC
HCT: 39.6 % (ref 39.0–52.0)
Hemoglobin: 13 g/dL (ref 13.0–17.0)
MCH: 28.9 pg (ref 26.0–34.0)
MCHC: 32.8 g/dL (ref 30.0–36.0)
MCV: 88 fL (ref 80.0–100.0)
Platelets: 179 10*3/uL (ref 150–400)
RBC: 4.5 MIL/uL (ref 4.22–5.81)
RDW: 13.2 % (ref 11.5–15.5)
WBC: 7.2 10*3/uL (ref 4.0–10.5)
nRBC: 0 % (ref 0.0–0.2)

## 2020-10-13 LAB — BASIC METABOLIC PANEL
Anion gap: 7 (ref 5–15)
BUN: 20 mg/dL (ref 8–23)
CO2: 27 mmol/L (ref 22–32)
Calcium: 8.7 mg/dL — ABNORMAL LOW (ref 8.9–10.3)
Chloride: 101 mmol/L (ref 98–111)
Creatinine, Ser: 1.21 mg/dL (ref 0.61–1.24)
GFR, Estimated: >60 mL/min (ref >60)
Glucose, Bld: 93 mg/dL (ref 70–99)
Potassium: 3.9 mmol/L (ref 3.5–5.1)
Sodium: 135 mmol/L (ref 135–145)

## 2020-10-13 MED ORDER — BACLOFEN 10 MG PO TABS
10.0000 mg | ORAL_TABLET | Freq: Two times a day (BID) | ORAL | Status: DC
  Administered 2020-10-13 – 2020-10-22 (×18): 10 mg via ORAL
  Filled 2020-10-13 (×18): qty 1

## 2020-10-13 NOTE — Progress Notes (Signed)
Physical Therapy Session Note  Patient Details  Name: Brent Harrington MRN: [id number] Date of Birth: 06-10-1950  Today's Date: 10/13/2020 PT Individual Time: 1435-1535 PT Individual Time Calculation (min): 60 min   Short Term Goals: Week 1:  PT Short Term Goal 1 (Week 1): Pt will perform bed mobility with minA. PT Short Term Goal 1 - Progress (Week 1): Met PT Short Term Goal 2 (Week 1): Pt will perform sit to stand transfer with modA. PT Short Term Goal 2 - Progress (Week 1): Met PT Short Term Goal 3 (Week 1): Pt will perform bed to chair transfer with modA. PT Short Term Goal 3 - Progress (Week 1): Met PT Short Term Goal 4 (Week 1): Pt will ambulate x50' with modA +1 and LRAD. PT Short Term Goal 4 - Progress (Week 1): Progressing toward goal Week 2:  PT Short Term Goal 1 (Week 2): Pt will perform bed mobility with CGA. PT Short Term Goal 2 (Week 2): Pt will perform stand pivot transfer with minA. PT Short Term Goal 3 (Week 2): Pt will ambulate x50' with modA +1 and LRAD. PT Short Term Goal 4 (Week 2): Pt will initiate stair training. Week 3:     Skilled Therapeutic Interventions/Progress Updates:    Pain:  Pt reports no pain.  Treatment to tolerance.  Rest breaks and repositioning as needed.  Pt initially oob in wc and agreeable to treatment session w/focus on Gait training in [place]. Pt initially worked on [employer] via retropulsion of wc as warm up. Pt set up in [place] and at end of session removed from Lite gait harness by therapist in sitting/standing. Gait 230f in LDunnelloninitially w/total assist to advance LLE, verbal and tactile cues to facilitate quad + glut activation w/loading to midstance ("push your heel down into floor) and therapist using shoe cover to decrease friction, AFO, and gait belt as leg lifter for advancement of limb thru swing.  Pt encourage to achieve trailing limb and upright posture w/mild wt shift to R for mechanical advantage w/swing on L.   During last 559f pt able to initiate swing on L inconsistently approx 30% of steps.  Pt rested in sitting x 7-8 min then repeated gait as above x 20043f This pass, pt able to consistently advance LLE at least 75% of time thru 50% -100% of swing phase.  Therapist discontnued use of gait belt leg lifter and switched to tactile cues/verbal cues as pt demonstrated significant improvement in hip flexor recruitment.  Inconsistent foot placement w/tendency toward adducrion and IR.    At end of session, pt left oob in wc w/needs in reach, alarm belt set.  Therapy Documentation Precautions:  Precautions Precautions: Fall Precaution Comments: left hemi Restrictions Weight Bearing Restrictions: No    Therapy/Group: Individual Therapy BarCallie FieldingT [place]25/2022, 4:00 PM

## 2020-10-13 NOTE — Progress Notes (Signed)
PROGRESS NOTE   Subjective/Complaints: Still some spasms in left foot at night, early in AM. Didn't see a huge difference with baclofen. They aren't really painful  ROS: Patient denies fever, rash, sore throat, blurred vision, nausea, vomiting, diarrhea, cough, shortness of breath or chest pain, joint or back pain, headache, or mood change.   Objective:   No results found. Recent Labs    10/13/20 0527  WBC 7.2  HGB 13.0  HCT 39.6  PLT 179    Recent Labs    10/13/20 0527  NA 135  K 3.9  CL 101  CO2 27  GLUCOSE 93  BUN 20  CREATININE 1.21  CALCIUM 8.7*      Intake/Output Summary (Last 24 hours) at 10/13/2020 1034 Last data filed at 10/13/2020 0700 Gross per 24 hour  Intake 660 ml  Output 1500 ml  Net -840 ml        Physical Exam: Vital Signs Blood pressure 118/81, pulse 82, temperature 98.3 F (36.8 C), resp. rate 17, height '5\' 11"'$  (1.803 m), weight 95.1 kg, SpO2 98 %.     Constitutional: No distress . Vital signs reviewed. HEENT: EOMI, oral membranes moist Neck: supple Cardiovascular: RRR without murmur. No JVD    Respiratory/Chest: CTA Bilaterally without wheezes or rales. Normal effort    GI/Abdomen: BS +, non-tender, non-distended Ext: no clubbing, cyanosis, or edema Psych: pleasant and cooperative  Neuro: Pt is cognitively appropriate with normal insight, memory, and awareness. Cranial nerves 2-12 are intact. LLE with sl diminished LT. LUE touch only minimally diminished..   No tremors. Motor function is grossly 5/5 on right. LUE 4 to 4+/5. LLE 1+ to 2/5 HF, HAD,HAB, 2+/5 KE, and still 0/5 at  ankle- no real resting tone. DTR's 3+ LLE Musculoskeletal: Full ROM, No pain with AROM or PROM in the neck, trunk, or extremities. Posture appropriate . Left heel cord tightness better   Assessment/Plan: 1. Functional deficits which require 3+ hours per day of interdisciplinary therapy in a comprehensive  inpatient rehab setting. Physiatrist is providing close team supervision and 24 hour management of active medical problems listed below. Physiatrist and rehab team continue to assess barriers to discharge/monitor patient progress toward functional and medical goals  Care Tool:  Bathing    Body parts bathed by patient: Right arm   Body parts bathed by helper: Left arm     Bathing assist Assist Level: Moderate Assistance - Patient 50 - 74%     Upper Body Dressing/Undressing Upper body dressing   What is the patient wearing?: Pull over shirt    Upper body assist Assist Level: Supervision/Verbal cueing    Lower Body Dressing/Undressing Lower body dressing      What is the patient wearing?: Pants     Lower body assist Assist for lower body dressing: Moderate Assistance - Patient 50 - 74%     Toileting Toileting    Toileting assist Assist for toileting: Moderate Assistance - Patient 50 - 74%     Transfers Chair/bed transfer  Transfers assist     Chair/bed transfer assist level: Minimal Assistance - Patient > 75%     Locomotion Ambulation   Ambulation assist  Assist level: Maximal Assistance - Patient 25 - 49% Assistive device: Other (comment) (hallway rail R) Max distance: 20   Walk 10 feet activity   Assist     Assist level: Maximal Assistance - Patient 25 - 49% Assistive device: Other (comment) (hallway rail R)   Walk 50 feet activity   Assist Walk 50 feet with 2 turns activity did not occur: Safety/medical concerns         Walk 150 feet activity   Assist Walk 150 feet activity did not occur: Safety/medical concerns         Walk 10 feet on uneven surface  activity   Assist Walk 10 feet on uneven surfaces activity did not occur: Safety/medical concerns         Wheelchair     Assist Will patient use wheelchair at discharge?: No Type of Wheelchair: Manual    Wheelchair assist level: Supervision/Verbal cueing Max  wheelchair distance: 120 ft    Wheelchair 50 feet with 2 turns activity    Assist        Assist Level: Supervision/Verbal cueing   Wheelchair 150 feet activity     Assist          Blood pressure 118/81, pulse 82, temperature 98.3 F (36.8 C), resp. rate 17, height '5\' 11"'$  (1.803 m), weight 95.1 kg, SpO2 98 %.  Medical Problem List and Plan: 1.  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  Antithrombotics: -DVT/anticoagulation:  Pharmaceutical: Heparin             -antiplatelet therapy: N/A 3. Pain Management: Tylenol as needed  7/17- added Robaxin prn and Magnesium 40 mg BID for muscle spasms-    7/25 increase baclofen to bid  for LLE spasms     4. Mood: LCSW to follow for evaluation and support             -antipsychotic agents: N/AA 5. Neuropsych: This patient is capable of making decisions on his own behalf. 6. Skin/Wound Care: incison Right crani looks good small scab but otherwise closed  7. Fluids/Electrolytes/Nutrition: encourage PO  7/25 .I personally reviewed the patient's labs today.  Bun improved  8.  Cerebral edema:   Decadron taper to off 9. Leukocytosis likely reactive/steroid related  -down to 7.2 7/25 10.  History of depression: Continue Lexapro  -affect has improved a lot as he's made functional gains and as he's gotten to know team.   -team needs to keep providing + reinforcement  -consider neuropsych eval 11.  Left focal motor seizure w/aura: ?Reports that he still continues to have seizures every 2-3 weeks/last a couple of days PTA --continue Keppra 750 mg twice daily. No sz activity seen      LOS: 12 days A FACE TO FACE EVALUATION WAS PERFORMED  Meredith Staggers 10/13/2020, 10:34 AM

## 2020-10-13 NOTE — Progress Notes (Signed)
Speech Language Pathology Daily Session Note  Patient Details  Name: Keegan Schneider MRN: [id number] Date of Birth: [birth year]  Today's Date: 10/13/2020 SLP Individual Time: 0930-1010 SLP Individual Time Calculation (min): 40 min  Short Term Goals: Week 2: SLP Short Term Goal 1 (Week 2): Patient will demonstrate selective attention in a mildly distracting enviornment for 45 minutes with supervision verbal cues for redirection. SLP Short Term Goal 2 (Week 2): Patient will recall daily, functional information with Supervision verbal and visual cues. SLP Short Term Goal 3 (Week 2): Patient will self-monitor and correct errors during mildly complex tasks with Supervision verbal cues. SLP Short Term Goal 4 (Week 2): Patient will demonstrate functional problem solving for functional and mildly complex tasks with Supervision verbal cues.  Skilled Therapeutic Interventions: Skilled treatment session focused on cognitive goals. SLP facilitated session by providing supervision level verbal cues for use of a memory compensatory strategy to maximize recall of 5 items that he needed to locate from the gift shop. Patient navigated to the gift shop throughout the hospital and located all 5 items with Mod I. Patient mildly verbose throughout task but was able to redirect himself back to the task. Patient left upright in the wheelchair with alarm on and all needs within reach. Continue with current plan of care.      Pain No/Denies Pain   Therapy/Group: Individual Therapy  Aron Inge 10/13/2020, 12:46 PM

## 2020-10-13 NOTE — Progress Notes (Signed)
Occupational Therapy Session Note  Patient Details  Name: Brent Harrington MRN: [id number] Date of Birth: July 14, 1950  Today's Date: 10/13/2020 OT Individual Time: 0830-0900 OT Individual Time Calculation (min): 30 min    Short Term Goals: Week 2:  OT Short Term Goal 1 (Week 2): Patient will complete toilet transfer with min A OT Short Term Goal 2 (Week 2): Patient will complete sit<>stand with miN A OT Short Term Goal 3 (Week 2): Patient will thread L LE through pant leg with AE as needed   Skilled Therapeutic Interventions/Progress Updates:    Pt received sitting in the w/c with no c/o pain, agreeable to session. Session focused on blocked practice stand pivot transfers. LLE required mod-max facilitation for pivoting- utilized shoe cover to reduce friction. Pt able to weightshift over to the R with min facilitation for L hip extension. Overall pt requiring min-mod A but progressing to more like CGA-min A with heavier reliance on the RW. Min cueing for technique and hand placement especially. Pt returned to his room and was left sitting up with chair alarm set.   Therapy Documentation Precautions:  Precautions Precautions: Fall Precaution Comments: left hemi Restrictions Weight Bearing Restrictions: No  Therapy/Group: Individual Therapy  Curtis Sites 10/13/2020, 6:04 AM

## 2020-10-13 NOTE — Progress Notes (Signed)
Occupational Therapy Session Note  Patient Details  Name: Brent Harrington MRN: [id number] Date of Birth: 07/26/1950  Today's Date: 10/13/2020 OT Individual Time: 1300-1400 OT Individual Time Calculation (min): 60 min    Short Term Goals: Week 2:  OT Short Term Goal 1 (Week 2): Patient will complete toilet transfer with min A OT Short Term Goal 2 (Week 2): Patient will complete sit<>stand with miN A OT Short Term Goal 3 (Week 2): Patient will thread L LE through pant leg with AE as needed  Skilled Therapeutic Interventions/Progress Updates:    Patient seated in w/c, alert and ready for therapy session.  He requests to change position and work on standing first due to discomfort from sitting.  He is able to propel himself via w/c to/from therapy gym.  Completed standing in parallel bars with focus on weight shift, lateral leans, trunk rotation and facilitation of left LE in standing position.   He is able to doff clothing with reacher and min A.  SPT w/c to/from shower bench with min A using grab bars.  Bathing completed using hand held shower, long handled sponge seated on shower bench with CGA/CS.  Dressing completed w/c level with set up for [facility] shirt, CGA pants over hips in stance, min A for non-skid slipper socks/AFO and sneakers.  He is able to tie sneakers.  Provided dycem for use over knee to increase leg control and reach for footwear.  He remained seated in w/c at close of session, seat belt alarm set and call bell in reach.    Therapy Documentation Precautions:  Precautions Precautions: Fall Precaution Comments: left hemi Restrictions Weight Bearing Restrictions: No   Therapy/Group: Individual Therapy  Carlos Levering 10/13/2020, 7:38 AM

## 2020-10-14 ENCOUNTER — Telehealth: Payer: Self-pay | Admitting: Internal Medicine

## 2020-10-14 NOTE — Telephone Encounter (Signed)
Received a staff msg to schedule Mr. Brent Harrington for a f/u appt for seizure. [name] has been scheduled to see Dr. Mickeal Skinner on 8/8 at 11am. Pt is currently in the hospital. Letter mailed.

## 2020-10-14 NOTE — Progress Notes (Signed)
Speech Language Pathology Discharge Summary  Patient Details  Name: Brent Harrington MRN: [id number] Date of Birth: [birth year]   Patient has met 3 of 3 long term goals.  Patient to discharge at overall Modified Independent;Supervision level.   Reasons goals not met: N/A   Clinical Impression/Discharge Summary: Patient has made excellent gains and has met 3 of 3 LTGs this admission. Currently, patient is overall supervision-Mod I to complete functional and mildly complex tasks safely in regards to attention, recall, problem solving and awareness. Patient education is complete and patient will be discharged from skilled SLP intervention secondary to being at his cognitive baseline with f/u not warranted at this time. Patient verbalized understanding and agreement.   Recommendation:  None      Equipment: N/A   Reasons for discharge: Treatment goals met   Patient/Family Agrees with Progress Made and Goals Achieved: Yes    [place], [place] 10/14/2020, 1:14 PM

## 2020-10-14 NOTE — Progress Notes (Signed)
PROGRESS NOTE   Subjective/Complaints: Doing well. Up in the gym with PT. Has the Left ankle/foot spasms at night but only a few.   ROS: Patient denies fever, rash, sore throat, blurred vision, nausea, vomiting, diarrhea, cough, shortness of breath or chest pain, joint or back pain, headache, or mood change.   Objective:   No results found. Recent Labs    10/13/20 0527  WBC 7.2  HGB 13.0  HCT 39.6  PLT 179    Recent Labs    10/13/20 0527  NA 135  K 3.9  CL 101  CO2 27  GLUCOSE 93  BUN 20  CREATININE 1.21  CALCIUM 8.7*      Intake/Output Summary (Last 24 hours) at 10/14/2020 1312 Last data filed at 10/14/2020 0805 Gross per 24 hour  Intake 540 ml  Output 1400 ml  Net -860 ml        Physical Exam: Vital Signs Blood pressure 116/80, pulse 82, temperature 97.8 F (36.6 C), temperature source Oral, resp. rate 17, height '5\' 11"'$  (1.803 m), weight 95.1 kg, SpO2 99 %.     Constitutional: No distress . Vital signs reviewed. HEENT: EOMI, oral membranes moist Neck: supple Cardiovascular: RRR without murmur. No JVD    Respiratory/Chest: CTA Bilaterally without wheezes or rales. Normal effort    GI/Abdomen: BS +, non-tender, non-distended Ext: no clubbing, cyanosis, or edema Psych: pleasant and cooperative  Skin: crani incision with small area with scab/debris Neuro: Pt is cognitively appropriate with normal insight, memory, and awareness. Cranial nerves 2-12 are intact. LLE with sl diminished LT. LUE touch only minimally diminished..   No tremors. Motor function is grossly 5/5 on right. LUE 4 to 4+/5. LLE 1+ to 2/5 HF, HAD,HAB, 2+/5 KE, and still 0/5 at  ankle- no real resting tone. DTR's 3+ LLE Musculoskeletal: Full ROM, No pain with AROM or PROM in the neck, trunk, or extremities. Posture appropriate . Left heel cord tightness better but still present   Assessment/Plan: 1. Functional deficits which require  3+ hours per day of interdisciplinary therapy in a comprehensive inpatient rehab setting. Physiatrist is providing close team supervision and 24 hour management of active medical problems listed below. Physiatrist and rehab team continue to assess barriers to discharge/monitor patient progress toward functional and medical goals  Care Tool:  Bathing    Body parts bathed by patient: Right arm, Left arm, Chest, Abdomen, Front perineal area, Buttocks, Right upper leg, Left upper leg, Right lower leg, Left lower leg, Face   Body parts bathed by helper: Left arm     Bathing assist Assist Level: Contact Guard/Touching assist     Upper Body Dressing/Undressing Upper body dressing   What is the patient wearing?: Pull over shirt    Upper body assist Assist Level: Supervision/Verbal cueing    Lower Body Dressing/Undressing Lower body dressing      What is the patient wearing?: Pants     Lower body assist Assist for lower body dressing: Contact Guard/Touching assist     Toileting Toileting    Toileting assist Assist for toileting: Moderate Assistance - Patient 50 - 74%     Transfers Chair/bed transfer  Transfers assist  Chair/bed transfer assist level: Minimal Assistance - Patient > 75%     Locomotion Ambulation   Ambulation assist      Assist level: 2 helpers Assistive device: Lite Gait Max distance: 200   Walk 10 feet activity   Assist     Assist level: 2 helpers Assistive device: Lite Gait   Walk 50 feet activity   Assist Walk 50 feet with 2 turns activity did not occur: Safety/medical concerns    Assistive device: Lite Gait    Walk 150 feet activity   Assist Walk 150 feet activity did not occur: Safety/medical concerns  Assist level: 2 helpers Assistive device: Lite Gait    Walk 10 feet on uneven surface  activity   Assist Walk 10 feet on uneven surfaces activity did not occur: Safety/medical concerns          Wheelchair     Assist Will patient use wheelchair at discharge?: No Type of Wheelchair: Manual    Wheelchair assist level: Supervision/Verbal cueing Max wheelchair distance: 120 ft    Wheelchair 50 feet with 2 turns activity    Assist        Assist Level: Supervision/Verbal cueing   Wheelchair 150 feet activity     Assist          Blood pressure 116/80, pulse 82, temperature 97.8 F (36.6 C), temperature source Oral, resp. rate 17, height '5\' 11"'$  (1.803 m), weight 95.1 kg, SpO2 99 %.  Medical Problem List and Plan: 1.  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  Antithrombotics: -DVT/anticoagulation:  Pharmaceutical: Heparin             -antiplatelet therapy: N/A 3. Pain Management: Tylenol as needed  7/17- added Robaxin prn and Magnesium 40 mg BID for muscle spasms-    7/26 continue baclofen bid  for LLE spasms     4. Mood: LCSW to follow for evaluation and support             -antipsychotic agents: N/AA 5. Neuropsych: This patient is capable of making decisions on his own behalf. 6. Skin/Wound Care: incison Right crani looks good small scab but otherwise closed  7. Fluids/Electrolytes/Nutrition: encourage PO  7/26 BUN improved 8.  Cerebral edema:   Decadron taper to off 9. Leukocytosis likely reactive/steroid related  -down to 7.2 7/25 10.  History of depression: Continue Lexapro  -affect has improved a lot as he's made functional gains and as he's gotten to know team.   -team needs to keep providing + reinforcement  -consider neuropsych eval 11.  Left focal motor seizure w/aura: ?Reports that he still continues to have seizures every 2-3 weeks/last a couple of days PTA --continue Keppra 750 mg twice daily. No sz activity seen       LOS: 13 days A FACE TO FACE EVALUATION WAS PERFORMED  Meredith Staggers 10/14/2020, 1:12 PM

## 2020-10-14 NOTE — Progress Notes (Signed)
Occupational Therapy Session Note  Patient Details  Name: Brent Harrington MRN: [id number] Date of Birth: 1950/12/05  Today's Date: 10/14/2020 OT Individual Time: 1350-1500 OT Individual Time Calculation (min): 70 min    Short Term Goals: Week 2:  OT Short Term Goal 1 (Week 2): Patient will complete toilet transfer with min A OT Short Term Goal 2 (Week 2): Patient will complete sit<>stand with miN A OT Short Term Goal 3 (Week 2): Patient will thread L LE through pant leg with AE as needed  Skilled Therapeutic Interventions/Progress Updates:    Pt greeted seated in wc and agreeable to OT treatment session. Pt stated he did not want to do any BADLs today and did not need to go to the bathroom. Pt propelled wc to therapy gym. Squat-pivot transfer to therapy mat with min A. Worked on standing balance/endurance and weight shifting onto LLE while reaching outside base of support to place horse shoes on high basketball goal. Simulated clothing management with clothes pins on gait belt around waist while standing. Cues for weight shifting onto R LE. Supine hip bridges for glute activation, supine knee extension with bolster under knee. Seated hip extension with OT active assist. Pt completed squat-pivot back to wc with min A. Pt propelled wc back to room and left seated in wc with alarm belt on, call bell in reach, and needs met.   Therapy Documentation Precautions:  Precautions Precautions: Fall Precaution Comments: left hemi Restrictions Weight Bearing Restrictions: No Pain:  Denies pain   Therapy/Group: Individual Therapy  Valma Cava 10/14/2020, 3:09 PM

## 2020-10-14 NOTE — Patient Care Conference (Signed)
Inpatient RehabilitationTeam Conference and Plan of Care Update Date: 10/14/2020   Time: 10:47 AM    Patient Name: Brent Harrington      Medical Record Number: [id number]  Date of Birth: 07-17-1950 Sex: Male         Room/Bed: 4W15C/4W15C-01 Payor Info: Payor: [employer] MEDICARE / Plan: [employer] MEDICARE HMO/PPO / Product Type: *No Product type* /    Admit Date/Time:  10/01/2020  3:57 PM  Primary Diagnosis:  Meningioma, cerebral [name])  Hospital Problems: Principal Problem:   Meningioma, cerebral [name])    Expected Discharge Date: Expected Discharge Date: 10/22/20  Team Members Present: Physician leading conference: Dr. Alger Simons Social Worker Present: Loralee Pacas, [place] Nurse Present: Dorthula Nettles, RN PT Present: Tereasa Coop, PT OT Present: Cherylynn Ridges, OT SLP Present: Weston Anna, SLP PPS Coordinator present : Gunnar Fusi, SLP     Current Status/Progress Goal Weekly Team Focus  Bowel/Bladder   Continent B/B LBM 10/13/2020  regular BMs every 3 days or less  Assess B/B every 10/13/2020   Swallow/Nutrition/ Hydration             ADL's   Min A sit<>stands, min A LB ADLs  Supervision/CGA  L LE NMR, transfers, sit<>stands, activity tolerance, fine motor control   Mobility   CGA squat pivot transfers, minA sit to stand, modA RW x60' with RW  CGA  LLE NMR, balance, ambulation, transfers   Communication             Safety/Cognition/ Behavioral Observations  Supervision-Mod I  Supervision  Goals Mets, d/c from SLP services-at cognitive baseline   Pain   Denies pain  Pain level <3/10  Assess pain every shift and PRN   Skin   Ecchymosis to abdomen bilaterally  No new breakdown  Assess skin every shift and PRN     Discharge Planning:  D/c to home with son and dtr who live in the home. His sister [name] to be primary caregiver during the day since she is retired, and dtr in in nursing program.   Team Discussion: Intermittent spasms, scalp looks good. No ointment on  scalp incision. Continent B/B.  Patient on target to meet rehab goals: yes, poor safety awareness. Min/mod assist for lower body. Impulsive. Contact guard assist for squat pivot transfers. Thera band from hip to knee. SLP reports daughter feels he is at his baseline so SLP will discharge patient from their service.   *See Care Plan and progress notes for long and short-term goals.   Revisions to Treatment Plan:  Discontinued ointment for scalp.  Teaching Needs: Family education, medication management, skin/wound care, transfer training, gait training, balance training, endurance training, safety awareness.  Current Barriers to Discharge: Decreased caregiver support, Medical stability, Home enviroment access/layout, Wound care, Lack of/limited family support, Weight, Medication compliance, and Behavior  Possible Resolutions to Barriers: Continue current medications, provide emotional support.     Medical Summary Current Status: improved LLE movement. using AFO as still a lot of distal weakness. scalp wound healing. mood improved  Barriers to Discharge: Medical stability   Possible Resolutions to [name] Focus: daily assessment of labs, pain, pt data   Continued Need for Acute Rehabilitation Level of Care: The patient requires daily medical management by a physician with specialized training in physical medicine and rehabilitation for the following reasons: Direction of a multidisciplinary physical rehabilitation program to maximize functional independence : Yes Medical management of patient stability for increased activity during participation in an intensive rehabilitation regime.: Yes Analysis of  laboratory values and/or radiology reports with any subsequent need for medication adjustment and/or medical intervention. : Yes   I attest that I was present, lead the team conference, and concur with the assessment and plan of the team.   Cristi Loron 10/14/2020, 3:52 PM

## 2020-10-14 NOTE — Progress Notes (Signed)
Occupational Therapy Session Note  Patient Details  Name: Brent Harrington MRN: [id number] Date of Birth: 11/08/1950  Today's Date: 10/14/2020 OT Individual Time: 1035-1100 OT Individual Time Calculation (min): 25 min    Short Term Goals: Week 2:  OT Short Term Goal 1 (Week 2): Patient will complete toilet transfer with min A OT Short Term Goal 2 (Week 2): Patient will complete sit<>stand with miN A OT Short Term Goal 3 (Week 2): Patient will thread L LE through pant leg with AE as needed  Skilled Therapeutic Interventions/Progress Updates:    Pt received sitting in the w/c with no c/o pain, eager to start therapy. Pt completed w/c propulsion, 200 ft, with mod I. Focus on standing level functional reaching at the BITS. First trial he had difficulty achieving L knee extension and with increased cueing, and facilitation he required min-mod A to reach R and L outside of BOS. Pt Second trial with increased cueing for LLE placement before standing, pt was much more successful and required only CGA-min A. Pt returned to his room and was left sitting with all needs met, chair alarm set.   Therapy Documentation Precautions:  Precautions Precautions: Fall Precaution Comments: left hemi Restrictions Weight Bearing Restrictions: No    Therapy/Group: Individual Therapy  Curtis Sites 10/14/2020, 2:50 PM

## 2020-10-14 NOTE — Progress Notes (Signed)
Physical Therapy Session Note  Patient Details  Name: Brent Harrington MRN: [id number] Date of Birth: 1950/06/19  Today's Date: 10/14/2020 PT Individual Time: [id number] and [id number] PT Individual Time Calculation (min): 55 min and 24 min  Short Term Goals: Week 2:  PT Short Term Goal 1 (Week 2): Pt will perform bed mobility with CGA. PT Short Term Goal 2 (Week 2): Pt will perform stand pivot transfer with minA. PT Short Term Goal 3 (Week 2): Pt will ambulate x50' with modA +1 and LRAD. PT Short Term Goal 4 (Week 2): Pt will initiate stair training.  Skilled Therapeutic Interventions/Progress Updates:     Pt received seated in [facility] and agrees to therapy. No complaint of pain. Pt self propels WC to gym with bilateral upper extremities and cues on improved efficiency and technique. Pt performs squat pivot to mat table with CGA and cues for positioning. Pt sits at edge of mat and performs heel slides on ground with towel to decrease friction. Pt unable to perform knee flexion without physical assistance.  Pt has L AFO and shoes donned, and PT adds shoecover to decrease friction and theraband to promote hip and knee flexion. Pt performs multiple reps of sit to stand with minA and cues for body mechanics and hand placement. Pt ambulates x60' with RW and modA, with PT using foot to assist with progression of L lower extremity during swing phase. When pt adequately shifts weight to the R and forward, pt demos some activation of L hip flexors and is able to progress foot without PT providing additional assistance. Pt has difficulty managing position of L foot on initial contact, often externally rotated. Following seated rest break, pt ambulates additional 40' with similar assist levels, but with PT focusing manual assistance on weight shift to the R and pt performing >50% of L leg progressions. WC transport back to room. Pt left seated with alarm intact and all needs within reach.  2nd Session: Pt received  seated in [facility] and agrees to therapy. No complaint of pain. Pt self propels WC to dayroom with bilateral upper extremities. Squat pivot transfer to Nustep with minA. Pt performs Nustep for strength and endurance training, as well as reciprocal coordination and with emphasis on maintaining neutral L hip rotation during activity. Pt completes x12:00 total with several rest breaks, on workload of 5 with average steps per minute ~40. Pt is able to prevent L hip from externally rotating 50% of time but requires manual assistance the remainder of the activity. Squat pivot back to WC with minA. Left seated in [facility] with alarm intact and all needs within reach  Therapy Documentation Precautions:  Precautions Precautions: Fall Precaution Comments: left hemi Restrictions Weight Bearing Restrictions: No    Therapy/Group: Individual Therapy  Breck Coons, PT, DPT 10/14/2020, 4:26 PM

## 2020-10-15 NOTE — Progress Notes (Signed)
Occupational Therapy Session Note  Patient Details  Name: Brent Harrington MRN: [id number] Date of Birth: [birth year]  Today's Date: 10/15/2020 OT Individual Time: [id number] OT Individual Time Calculation (min): 33 min    Short Term Goals: Week 2:  OT Short Term Goal 1 (Week 2): Patient will complete toilet transfer with min A OT Short Term Goal 2 (Week 2): Patient will complete sit<>stand with miN A OT Short Term Goal 3 (Week 2): Patient will thread L LE through pant leg with AE as needed  Skilled Therapeutic Interventions/Progress Updates:    Pt in wheelchair to start agreeable to completion of OT session.  Had him focus on LE strengthening with use of the Nustep.  Min assist for sit to stand from the wheelchair with mod assist for transfer to the Nustep with use of the RW.  Max assist to advance the LLE during transfer.  Once sitting, he completed 15 mins of isolated BLE use with resistance on level 3 and therapist assisting to keep the LLE from abducting and externally rotating.  Average number of steps were 43-47 per minute.  Finished session with stand pivot transfer back to the wheelchair at mod assist level again with the RW for support.  Pt returned to the room with request to stay up in the chair and with the call button and phone in reach.  Safety belt in place as well.      Therapy Documentation Precautions:  Precautions Precautions: Fall Precaution Comments: left hemi Restrictions Weight Bearing Restrictions: No  Pain: Pain Assessment Pain Scale: Faces Pain Score: 0-No pain ADL: See Care Tool Section for some details of mobility and selfcare  Therapy/Group: Individual Therapy  Madelynne Lasker OTR/L 10/15/2020, 4:19 PM

## 2020-10-15 NOTE — Progress Notes (Signed)
Physical Therapy Session Note  Patient Details  Name: Brent Harrington MRN: [id number] Date of Birth: [birth year]  Today's Date: 10/15/2020 PT Individual Time: 1316-1400 PT Individual Time Calculation (min): 44 min   Short Term Goals: Week 2:  PT Short Term Goal 1 (Week 2): Pt will perform bed mobility with CGA. PT Short Term Goal 2 (Week 2): Pt will perform stand pivot transfer with minA. PT Short Term Goal 3 (Week 2): Pt will ambulate x50' with modA +1 and LRAD. PT Short Term Goal 4 (Week 2): Pt will initiate stair training.  Skilled Therapeutic Interventions/Progress Updates:     Pt received seated in [facility] and agrees to therapy. No complaint of pain. Pt self propels WC x80' to dayroom with bilateral upper extremities. Pt performs sit to stand with minA and climb forward onto mat table into quadruped position. Pt leads with L leg to prevent elicitation of extensor tone and PT provides modA for transition on table. Pt initially in quadruped and performs forward and posterior weight shifts to provide WB stimulus through L hemibody. PT then assist pt in transitioning to lunge position with L foot flat on mat and R leg in extension with knee on mat. Pt works on achieving upright posture for core strengthening and engagement of L leg outside of synergistic pattern. Pt requires modA for majority of activity to maintain trunk control and positioning of legs. Following extended rest break in sideyling, pt transitions back to quadruped with modA +2 with assistance of rec therapist. Pt performs x5 leg extensions with alternating legs and PT providing modA for weight shifting and support of weight of leg. Pt demos strong extension activation on L and requires increased time and cueing to relax muscle to allow L leg to come back into flexion with assistance of gravity. Pt transitions to prone position propped on elbows for stretch of hip flexors. Prone to supine with minA. Sidelying to sitting with minA. Squat  pivot to WC with minA. Left seated in WC with alarm intact and all needs within reach.  Therapy Documentation Precautions:  Precautions Precautions: Fall Precaution Comments: left hemi Restrictions Weight Bearing Restrictions: No   Therapy/Group: Individual Therapy  Breck Coons, PT, DPT 10/15/2020, 3:54 PM

## 2020-10-15 NOTE — Progress Notes (Addendum)
Patient ID: Erika Hussar, male   DOB: 10-03-1950, 70 y.o.   MRN: [id number]  10/14/20- SW met pt in room to provide updates from team conference on gains made, and  d/c date remains 8/3. Pt reported a ramp is in the process of being built. Pt aware SW to follow-up with pt sister Hassan Rowan.   10/15/20- SW returned phone call to pt sister Hassan Rowan [phone]) to discuss her concerns surrounding a ramp, and if covered under insurance and family edu. SW informed insurance does not cover ramps, however, their are vendors that may get insurance to cover the taxes on ramp if they have a prescription. Provided resources /suggestions on places where they can rent ramps. Not sure if the cousin will be building ramp. Family edu scheduled for Monday (8/1) 10am-12pm with sister and dtr.   Loralee Pacas, MSW, [place] Office: [phone] Cell: [phone] Fax: [phone]

## 2020-10-15 NOTE — Plan of Care (Signed)
  Problem: Consults Goal: RH BRAIN INJURY PATIENT EDUCATION Description: Description: See Patient Education module for eduction specifics Outcome: Progressing Goal: Skin Care Protocol Initiated - if Braden Score 18 or less Description: If consults are not indicated, leave blank or document N/A Outcome: Progressing   Problem: RH SKIN INTEGRITY Goal: RH STG ABLE TO PERFORM INCISION/WOUND CARE W/ASSISTANCE Description: STG Able To Perform Incision/Wound Care With min Assistance. Outcome: Progressing   Problem: RH SAFETY Goal: RH STG ADHERE TO SAFETY PRECAUTIONS W/ASSISTANCE/DEVICE Description: STG Adhere to Safety Precautions With min Assistance/Device. Outcome: Progressing   Problem: RH COGNITION-NURSING Goal: RH STG ANTICIPATES NEEDS/CALLS FOR ASSIST W/ASSIST/CUES Description: STG Anticipates Needs/Calls for Assist With Cues and Reminders. Outcome: Progressing   Problem: RH KNOWLEDGE DEFICIT BRAIN INJURY Goal: RH STG INCREASE KNOWLEDGE OF SELF CARE AFTER BRAIN INJURY Description: Patient will be able to demonstrate knowledge of medication management, skin/wound care, and safety awareness with educational materials and handouts provided by staff independently at discharge. Outcome: Progressing

## 2020-10-15 NOTE — Progress Notes (Signed)
Occupational Therapy Session Note  Patient Details  Name: Brent Harrington MRN: [id number] Date of Birth: 30-Dec-1950  Today's Date: 10/15/2020 OT Individual Time: 1400-1456 OT Individual Time Calculation (min): 56 min    Short Term Goals: Week 2:  OT Short Term Goal 1 (Week 2): Patient will complete toilet transfer with min A OT Short Term Goal 2 (Week 2): Patient will complete sit<>stand with miN A OT Short Term Goal 3 (Week 2): Patient will thread L LE through pant leg with AE as needed  Skilled Therapeutic Interventions/Progress Updates:  Pt received seated in w/c agreeable to OT intervention. Pt able to self propel w/c to gym with supervision. Pt completed x3 sit<>stands in parallel bars with MIN A, where pt complete x20 reps of mini squats with assist at L knee to block L knee into extension. Pt completed multiple dynamic reaching task where pt instructed to reach out of BOS and shift weight laterally to L side to retrieve items with BUEs. Pt additionally completed x10 sit<>stands from w/c with MIN A - CGA with RW. Pt able to complete cone taps in standing with RW and assist at L knee to lock into extension. Pt completed w/c propulsion back to room with supervision where pt was left up in w/c with alarm belt activated and all needs within reach.   Therapy Documentation Precautions:  Precautions Precautions: Fall Precaution Comments: left hemi Restrictions Weight Bearing Restrictions: No  Pain: Pt reports no pain during session.    Therapy/Group: Individual Therapy  Precious Haws 10/15/2020, 4:10 PM

## 2020-10-15 NOTE — Progress Notes (Signed)
Physical Therapy Session Note  Patient Details  Name: Brent Harrington MRN: [id number] Date of Birth: [birth year]  Today's Date: 10/15/2020 PT Individual Time: 0800-0857 PT Individual Time Calculation (min): 57 min   Short Term Goals: Week 2:  PT Short Term Goal 1 (Week 2): Pt will perform bed mobility with CGA. PT Short Term Goal 2 (Week 2): Pt will perform stand pivot transfer with minA. PT Short Term Goal 3 (Week 2): Pt will ambulate x50' with modA +1 and LRAD. PT Short Term Goal 4 (Week 2): Pt will initiate stair training.  Skilled Therapeutic Interventions/Progress Updates:     Patient in w/c in the room receiving meds from RN upon PT arrival. Patient alert and agreeable to PT session. Patient denied pain during session. L AFO and tennis shoes donned by patient prior to session. Retied L shoe to secure AFO.   Therapeutic Activity: Transfers: Patient performed sit to/from stand x3 with CGA and min A x1 due to posterior bias with RW. Provided verbal cues for forward weight shift and reaching back to sit to control descent. Patient propelled wheelchair ~100 feet to/from day room independently. Patient able to manage leg rests with min cues.   Gait Training:  Patient ambulated 15 feet using RW with L AFO with anterior strut to reduce knee buckling, DF, knee flexion, hip flexion elastic band wrap, and shoe cover. Elastic wrap not allowing good hip/knee extension in stance, removed and patient ambulated 165 feet with AFO and shoe cover with mod A and total progressing to max A for L limb advancement due to decreased motor planning (frequently pushing down rather than lifting up, improved with repetition). Provided max multimodal cues for forward and lateral weight shift, progressing of RW, increased gluteal activation in stance, hamstring activation in pre-swing, and reciprocal gait pattern.   Patient in w/c in the room at end of session with breaks locked, seat belt alarm set, and all needs  within reach.   Therapy Documentation Precautions:  Precautions Precautions: Fall Precaution Comments: left hemi Restrictions Weight Bearing Restrictions: No   Therapy/Group: Individual Therapy  Brent Harrington PT, DPT  10/15/2020, 9:02 AM

## 2020-10-16 MED ORDER — ENOXAPARIN SODIUM 40 MG/0.4ML IJ SOSY
40.0000 mg | PREFILLED_SYRINGE | INTRAMUSCULAR | Status: DC
  Administered 2020-10-16 – 2020-10-21 (×6): 40 mg via SUBCUTANEOUS
  Filled 2020-10-16 (×7): qty 0.4

## 2020-10-16 NOTE — Progress Notes (Signed)
Patient ID: Brent Harrington, male   DOB: 01/25/1951, 70 y.o.   MRN: [id number]  SW provided pt with ramp information, and ramp prescription in the event they decide have one built. SW reminded pt the rx is only good for 30 days. Pt stated that they found a portable ramp online at [facility].   Loralee Pacas, MSW, [place] Office: [phone] Cell: [phone] Fax: [phone]

## 2020-10-16 NOTE — Progress Notes (Signed)
Recreational Therapy Session Note  Patient Details  Name: Brent Harrington MRN: [id number] Date of Birth: 12/15/1950 Today's Date: 10/16/2020 Time:  [id number] Pain: no c/o Skilled Therapeutic Interventions/Progress Updates: Pt participated in Community reintegration/outing to [place] at [employer] assist w/c level.  Pt required verbal cues for left inattention, problem solving through obstacles, safety awareness.  Goals focused on safe community mobility, identification & negotiation of obstacles, accessing public restroom, energy conservation techniques/education.  See outing goal sheet in shadow chart for full details.   Therapy/Group: [employer]  Barbi Kumagai 10/16/2020, 2:58 PM

## 2020-10-16 NOTE — Progress Notes (Signed)
Occupational Therapy Weekly Progress Note  Patient Details  Name: Arrian Manson MRN: [id number] Date of Birth: Apr 16, 1950  Beginning of progress report period: October 02, 2020 End of progress report period: October 16, 2020  Patient has met 3 of 3 short term goals. Mr. Carvalho is making great progress towards his OT goals. He is mod/I/supervision for all of his upper body ADLs. Lower body ADLs are coming along with improved activation in LLE needed for threading pants, standing, and balance within functional tasks. Pt is on target to meet his goals in preparation for dc home next week. Continue current POC.   Patient continues to demonstrate the following deficits: muscle weakness, impaired timing and sequencing, abnormal tone, unbalanced muscle activation, ataxia, decreased coordination, and decreased motor planning, and decreased sitting balance, decreased standing balance, hemiplegia, and decreased balance strategies and therefore will continue to benefit from skilled OT intervention to enhance overall performance with BADL and Reduce care partner burden.  Patient progressing toward long term goals..  Continue plan of care.  OT Short Term Goals Week 2:  OT Short Term Goal 1 (Week 2): Patient will complete toilet transfer with min A OT Short Term Goal 1 - Progress (Week 2): Met OT Short Term Goal 2 (Week 2): Patient will complete sit<>stand with miN A OT Short Term Goal 2 - Progress (Week 2): Met OT Short Term Goal 3 (Week 2): Patient will thread L LE through pant leg with AE as needed OT Short Term Goal 3 - Progress (Week 2): Met Week 3:  OT Short Term Goal 1 (Week 3): LTG=STG 2/2 ELOS   Therapy/Group: Individual Therapy  Valma Cava 10/16/2020, 7:51 AM

## 2020-10-16 NOTE — Progress Notes (Signed)
Occupational Therapy Session Note  Patient Details  Name: Brent Harrington MRN: [id number] Date of Birth: 03/10/1951  Today's Date: 10/16/2020 OT Individual Time: 4320-0379 OT Individual Time Calculation (min): 55 min    Short Term Goals: Week 2:  OT Short Term Goal 1 (Week 2): Patient will complete toilet transfer with min A OT Short Term Goal 1 - Progress (Week 2): Met OT Short Term Goal 2 (Week 2): Patient will complete sit<>stand with miN A OT Short Term Goal 2 - Progress (Week 2): Met OT Short Term Goal 3 (Week 2): Patient will thread L LE through pant leg with AE as needed OT Short Term Goal 3 - Progress (Week 2): Met  Skilled Therapeutic Interventions/Progress Updates:  Met pt sitting in w/c, safety belt on, pt agreed to session. Treatment session was focused on self-care retraining and sit > stand transfers for functional activities. Pt self-propelled to bathroom and positioned self for stand pivot transfer to shower chair. Pt stand pivot transferred with CGA requiring cues for safety during transfer. Pt doffed UB dressing with supervision and LB dressing and footwear with Min A, required physical assist to life leg. Pt used reacher to doff footwear and LB dressing. Pt completed bathing seated with close supervision, able to problem-solve safe perineal care while seated. Pt used BUE for LB bathing using long handled sponge. Pt stand pivoted back to w/c with Min A due to poor safety awareness. Pt brought to room to donn UB dressing with supervision seated and LB dressing with CGA standing using sink to stabilize. Pt donned footwear with Min A and able to problem solve donning L shoe with AFO. Pt was left siting in w/c, safety belt on, all needs met.   Therapy Documentation Precautions:  Precautions Precautions: Fall Precaution Comments: left hemi Restrictions Weight Bearing Restrictions: No Pain: Pain Assessment Pain Scale: 0-10 Pain Score: 0-No pain Faces Pain Scale: No  hurt    Therapy/Group: Individual Therapy  Zuleyma Scharf 10/16/2020, 9:35 AM

## 2020-10-16 NOTE — Evaluation (Signed)
Recreational Therapy Assessment and Plan  Patient Details  Name: Brent Harrington MRN: [id number] Date of Birth: [birth year] Today's Date: 10/16/2020  Rehab Potential:  Good ELOS:   d/c 8/1 LATE entry for 10/15/2020 Assessment Hospital Problem: Principal Problem:   Meningioma, cerebral [facility])     Past Medical History:      Past Medical History:  Diagnosis Date   Anxiety     Cancer ([place]) 2012    Colon Cancer    Past Surgical History:       Past Surgical History:  Procedure Laterality Date   APPLICATION OF CRANIAL NAVIGATION Right 09/24/2020    Procedure: APPLICATION OF CRANIAL NAVIGATION;  Surgeon: Vallarie Mare, MD;  Location: [place];  Service: Neurosurgery;  Laterality: Right;   COLON SURGERY   2012   CRANIOTOMY Right 09/24/2020    Procedure: RIGHT CRANIOTOMY FOR RESECTION OF FALCINE MENINGIOMA;  Surgeon: Vallarie Mare, MD;  Location: [place];  Service: Neurosurgery;  Laterality: Right;      Assessment & Plan Clinical Impression: Patient is a 70 y.o. 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 Marland Kitchen  He was admitted on 09/24/2020 for right craniotomy and resection of meningioma by Dr. Marcello Moores.  Postop has had left upper greater than left lower extremity weakness with numbness.  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  He was started on Decadron for edema control and has had improvement in LUE strength. He continues to be limited by LLE weakness with decreased hemiplegia as well  as impulsivity with poor safety awareness. Patient transferred to CIR on 10/01/2020 .    Met with pt to discuss leisure education, activity analysis/potential modifications, community pursuits, coping strategies.  Dicussed community reintegration including purpose and potential goals.  Pt agreeable to participate in an outing during LOS.  Pt presents with decreased activity tolerance, decreased functional mobility, decreased balance, decreased coordination, left inattention, decreased problem solving, decreased safety awareness, and decreased memory Limiting pt's independence with leisure/community pursuits.   Plan  Min 1 TR session >20 minutes during LOS  Recommendations for other services: None   Discharge Criteria: Patient will be discharged from TR if patient refuses treatment 3 consecutive times without medical reason.  If treatment goals not met, if there is a change in medical status, if patient makes no progress towards goals or if patient is discharged from hospital.  The above assessment, treatment plan, treatment alternatives and goals were discussed and mutually agreed upon: by patient  [place] 10/16/2020, 2:39 PM

## 2020-10-16 NOTE — Progress Notes (Signed)
PROGRESS NOTE   Subjective/Complaints: Up in his chair. No new problems. Happy with his progress  ROS: Patient denies fever, rash, sore throat, blurred vision, nausea, vomiting, diarrhea, cough, shortness of breath or chest pain, joint or back pain, headache, or mood change.   Objective:   No results found. No results for input(s): WBC, HGB, HCT, PLT in the last 72 hours.   No results for input(s): NA, K, CL, CO2, GLUCOSE, BUN, CREATININE, CALCIUM in the last 72 hours.     Intake/Output Summary (Last 24 hours) at 10/16/2020 1046 Last data filed at 10/16/2020 0500 Gross per 24 hour  Intake 360 ml  Output 1400 ml  Net -1040 ml        Physical Exam: Vital Signs Blood pressure 101/72, pulse 75, temperature 98.2 F (36.8 C), resp. rate 17, height '5\' 11"'$  (1.803 m), weight 95.1 kg, SpO2 93 %.     Constitutional: No distress . Vital signs reviewed. HEENT: EOMI, oral membranes moist Neck: supple Cardiovascular: RRR without murmur. No JVD    Respiratory/Chest: CTA Bilaterally without wheezes or rales. Normal effort    GI/Abdomen: BS +, non-tender, non-distended Ext: no clubbing, cyanosis, or edema Psych: pleasant and cooperative  Skin: crani incision with small area with scab coming loose Neuro: Pt is cognitively appropriate with normal insight, memory, and awareness. Cranial nerves 2-12 are intact. LLE with sl diminished LT. LUE touch only minimally diminished..   No tremors. Motor function is grossly 5/5 on right. LUE 4 to 4+/5. LLE 1  HF, 2/5 HE,  HAD,HAB, 2+/5 KE, and still 0/5 at  ankle- no real resting tone. DTR's 3+ LLE Musculoskeletal: Full ROM, No pain with AROM or PROM in the neck, trunk, or extremities. Posture appropriate . Left heel cord tightness improving   Assessment/Plan: 1. Functional deficits which require 3+ hours per day of interdisciplinary therapy in a comprehensive inpatient rehab  setting. Physiatrist is providing close team supervision and 24 hour management of active medical problems listed below. Physiatrist and rehab team continue to assess barriers to discharge/monitor patient progress toward functional and medical goals  Care Tool:  Bathing    Body parts bathed by patient: Right arm, Left arm, Chest, Abdomen, Front perineal area, Buttocks, Right upper leg, Left upper leg, Right lower leg, Left lower leg, Face   Body parts bathed by helper: Left arm     Bathing assist Assist Level: Contact Guard/Touching assist     Upper Body Dressing/Undressing Upper body dressing   What is the patient wearing?: Pull over shirt    Upper body assist Assist Level: Set up assist    Lower Body Dressing/Undressing Lower body dressing      What is the patient wearing?: Pants     Lower body assist Assist for lower body dressing: Contact Guard/Touching assist     Toileting Toileting    Toileting assist Assist for toileting: Minimal Assistance - Patient > 75%     Transfers Chair/bed transfer  Transfers assist     Chair/bed transfer assist level: Minimal Assistance - Patient > 75%     Locomotion Ambulation   Ambulation assist      Assist level: Moderate Assistance -  Patient 50 - 74% Assistive device: Walker-rolling Max distance: 165 ft   Walk 10 feet activity   Assist     Assist level: Moderate Assistance - Patient - 50 - 74% Assistive device: Walker-rolling   Walk 50 feet activity   Assist Walk 50 feet with 2 turns activity did not occur: Safety/medical concerns  Assist level: Moderate Assistance - Patient - 50 - 74% Assistive device: Walker-rolling    Walk 150 feet activity   Assist Walk 150 feet activity did not occur: Safety/medical concerns  Assist level: Moderate Assistance - Patient - 50 - 74% Assistive device: Walker-rolling    Walk 10 feet on uneven surface  activity   Assist Walk 10 feet on uneven surfaces activity  did not occur: Safety/medical concerns         Wheelchair     Assist Will patient use wheelchair at discharge?: No Type of Wheelchair: Manual    Wheelchair assist level: Independent Max wheelchair distance: 100 ft    Wheelchair 50 feet with 2 turns activity    Assist        Assist Level: Independent   Wheelchair 150 feet activity     Assist          Blood pressure 101/72, pulse 75, temperature 98.2 F (36.8 C), resp. rate 17, height '5\' 11"'$  (1.803 m), weight 95.1 kg, SpO2 93 %.  Medical Problem List and Plan: 1.  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  Antithrombotics: -DVT/anticoagulation:  Pharmaceutical: Heparin             -antiplatelet therapy: N/A 3. Pain Management: Tylenol as needed  7/17- added Robaxin prn and Magnesium 40 mg BID for muscle spasms-    7/28 continue baclofen bid  for LLE spasms     4. Mood: LCSW to follow for evaluation and support             -antipsychotic agents: N/AA 5. Neuropsych: This patient is capable of making decisions on his own behalf. 6. Skin/Wound Care: incison Right crani looks good small scab but otherwise closed  7. Fluids/Electrolytes/Nutrition: encourage PO  7/26 BUN improved 8.  Cerebral edema:   Decadron taper to off 9. Leukocytosis likely reactive/steroid related  -down to 7.2 7/25 10.  History of depression: Continue Lexapro  -affect has improved a lot as he's made functional gains and as he's gotten to know team.  11.  Left focal motor seizure w/aura: ?Reports that he still continues to have seizures every 2-3 weeks/last a couple of days PTA --continue Keppra 750 mg twice daily. No sz activity seen      LOS: 15 days A FACE TO FACE EVALUATION WAS PERFORMED  Meredith Staggers 10/16/2020, 10:46 AM

## 2020-10-16 NOTE — Progress Notes (Signed)
Physical Therapy Session Note  Patient Details  Name: Brent Harrington MRN: [id number] Date of Birth: [birth year]  Today's Date: 10/16/2020 PT Individual Time: 1035-1100 and 1300-1425 PT Individual Time Calculation (min): 25 min and 85 min  Short Term Goals: Week 2:  PT Short Term Goal 1 (Week 2): Pt will perform bed mobility with CGA. PT Short Term Goal 2 (Week 2): Pt will perform stand pivot transfer with minA. PT Short Term Goal 3 (Week 2): Pt will ambulate x50' with modA +1 and LRAD. PT Short Term Goal 4 (Week 2): Pt will initiate stair training.  Skilled Therapeutic Interventions/Progress Updates:     1st Session: Pt received seated in [facility] and agrees to therapy. Pt self propels WC to gym x100', with verbal cues on propulsion technique due to pt using short, inefficient pushes with L upper extremity. Squat pivot transfer to mat with CGA and verbal cues on body mechanics and sequencing. Pt performs sit to stand to RW with minA. Pt does require cueing on anterior weight transition and hand placement, as pt demos some fear of falling and associated posterior bias when attempting transfer. In standing, pt cued to achieve optimal upright posture with hip extension and neutral trunk, with PT providing tactile facilitation of L knee stability as well as quad contraction. Pt performs repeated R leg lifts to facilitate L lateral weight shift and and WB through L hemibody. Pt reports some discomfort in back so PT assists pt into supine and educates on using arms to pull both knees into chest for low back stretch as well as encouraging hip flexion and out-of-synergy movement for L leg. Pt verblizes improved feeling in back after stretch for several minutes. Supine to sit with verbal cues on sequencing. Squat pivot to the L back to WC with minA. Pt left seated in WC with alarm intact and all needs within reach.  2nd session: Pt taken on outing with Rec Therapist. Received seated in [facility] and agrees to therapy. No  complaint of pain. WC transport outside for time management. Rec therapist assists pt with loading onto van via [place] lift. PT has active discussion with pt regarding goals for outing as well as potential safety concerns and importance of planning ahead. Upon arrival to [facility], pt unloaded from [place] and performs WC propulsion in parking lot with occasional minA due to fatigue and unlevel surfaces. PT cues for locating handicap ramp to ascend onto curb and pt requires minA going up ramp. Inside store, pt navigates around obstacles in crowded environment and problem solves potential barriers such as reaching for objects on high shelves, carrying objects while shopping, and navigating public restroom. Pt self propels WC for majority of time, working on efficiency and endurance.   Upon return to CIR, pt transported to gym for energy conservation. PT provides pt with WC propulsion gloves to help prevent overuse injury to hands. Pt then performs car transfer with modA, primarily for lifting L leg into car, utilizing squat pivot technique.   Pt left seated in WC with alarm intact and all needs within reach.  Therapy Documentation Precautions:  Precautions Precautions: Fall Precaution Comments: left hemi Restrictions Weight Bearing Restrictions: No    Therapy/Group: Individual Therapy and CoTx with Rec Therapist  Breck Coons, PT, DPT 10/16/2020, 3:45 PM

## 2020-10-17 NOTE — Progress Notes (Signed)
Physical Therapy Weekly Progress Note  Patient Details  Name: Brent Harrington MRN: [id number] Date of Birth: Mar 29, 1950  Beginning of progress report period: October 09, 2020 End of progress report period: October 17, 2020  Today's Date: 10/17/2020 PT Individual Time: 2330-0762 PT Individual Time Calculation (min): 99 min   Patient has met 2 of 4 short term goals.  Pt is progressing well toward mobility goals, improving independence with functional transfer as well as gait training. Pt still requires occasional minA for sit to supine due to L lower extremity weakness and lack of motor control, and has not been able to initiate stair training for same reason. Pt focus for coming week will be improving efficiency and safety with transfers, ongoing L lower extremity NMR, and DC prep.  Patient continues to demonstrate the following deficits muscle weakness, decreased cardiorespiratoy endurance, impaired timing and sequencing, unbalanced muscle activation, decreased coordination, and decreased motor planning, decreased attention to left, and decreased standing balance, decreased postural control, hemiplegia, and decreased balance strategies and therefore will continue to benefit from skilled PT intervention to increase functional independence with mobility.  Patient progressing toward long term goals..  Continue plan of care.  PT Short Term Goals Week 2:  PT Short Term Goal 1 (Week 2): Pt will perform bed mobility with CGA. PT Short Term Goal 1 - Progress (Week 2): Progressing toward goal PT Short Term Goal 2 (Week 2): Pt will perform stand pivot transfer with minA. PT Short Term Goal 2 - Progress (Week 2): Met PT Short Term Goal 3 (Week 2): Pt will ambulate x50' with modA +1 and LRAD. PT Short Term Goal 3 - Progress (Week 2): Met PT Short Term Goal 4 (Week 2): Pt will initiate stair training. PT Short Term Goal 4 - Progress (Week 2): Progressing toward goal Week 3:  PT Short Term Goal 1 (Week 3): STGs  = LTGs  Skilled Therapeutic Interventions/Progress Updates:  Ambulation/gait training;Community reintegration;DME/adaptive equipment instruction;Neuromuscular re-education;Psychosocial support;Stair training;UE/LE Strength taining/ROM;Wheelchair propulsion/positioning;Balance/vestibular training;Discharge planning;Functional electrical stimulation;Pain management;Skin care/wound management;Therapeutic Activities;UE/LE Coordination activities;Cognitive remediation/compensation;Functional mobility training;Disease management/prevention;Patient/family education;Splinting/orthotics;Therapeutic Exercise;Visual/perceptual remediation/compensation   Pt received seated in WC and agrees to therapy. No complaint of pain. Pt self propels WC x100' to gym with bilateral upper extremities and cues on improving efficiency of propulsion technique. Pt meet with Gerald Stabs from [facility] for AFO consultation. With L AFO, pt performs sit to stand with minA, then ambulates x20' with RW and modA +2, with Gerald Stabs providing assistance with progression of L lower extremity to evaluate gait pattern. Gerald Stabs recommends toe-cap and possible experimentation with other devices, such as toe-up brace. Pt's shoe taken for toe-cap placement. Pt then performs supine L lower extremity movement for motor retraining. With gait belt used as "leg-lifter" pt performs heel slides with L leg, with PT providing assistance to initiate flexion of L knee. Pt cues to actively attempt movement with L leg, and to perform simultaneously with R leg for cross-over training. Pt then positioned in R sidelying with L leg resting on platform with towel to decrease friction, and performs L hip and knee flexion with use of gait belt. Pt is able to actively perform extension, but requires gait belt to move leg into flexion. PT provides cues on optimal mechanics and performance for proper motor retraining. Supine to sit with minA and cues for logrolling technique. Pt ambulates x60'  with RW and modA, with shoe cover placed on L shoe to decrease friction, and PT providing assistance for progression of L lower  extremity, as well as weight shifting to the R to completely unweight L leg for swing phase.  Pt educated on risk for falls and mental checklist should he experience fall in home. Pt then assisted onto mat on floor and practices floor transfer. PT assists pt's left leg into "frog leg" position to prevent extensor tone, then blocks leg while lifting on pt's ASIS's. Pt is able to get hands and both knees on mat to attain quadruped position. Then achieves high kneeling position and lift up onto mat. PT provides modA overall and educates on potential for family to help with floor transfer if needed.  PT educates on parts of WC and management. Pt then instructed to approach mat table and perform transfer to/from WC with as little assistance from PT as possible. Pt requires some verbal cueing for positioning of WC but otherwise is able to perform without assistance.  Pt left seated in WC with alarm intact and all needs within reach.  Therapy Documentation Precautions:  Precautions Precautions: Fall Precaution Comments: left hemi Restrictions Weight Bearing Restrictions: No  Therapy/Group: Individual Therapy  William C Allen, PT ,DPT 10/17/2020, 5:10 PM  

## 2020-10-17 NOTE — Plan of Care (Signed)
  Problem: RH Dressing Goal: LTG Patient will perform lower body dressing w/assist (OT) Description: LTG: Patient will perform lower body dressing with assist, with/without cues in positioning using equipment (OT) Flowsheets (Taken 10/17/2020 2228) LTG: Pt will perform lower body dressing with assistance level of: Contact Guard/Touching assist Note: Goal downgraded 7/29-ESD   Problem: RH Toileting Goal: LTG Patient will perform toileting task (3/3 steps) with assistance level (OT) Description: LTG: Patient will perform toileting task (3/3 steps) with assistance level (OT)  Flowsheets (Taken 10/17/2020 2228) LTG: Pt will perform toileting task (3/3 steps) with assistance level: Contact Guard/Touching assist Note: Goal downgraded 7/29-ESD   Problem: RH Toilet Transfers Goal: LTG Patient will perform toilet transfers w/assist (OT) Description: LTG: Patient will perform toilet transfers with assist, with/without cues using equipment (OT) Flowsheets (Taken 10/17/2020 2228) LTG: Pt will perform toilet transfers with assistance level of: Minimal Assistance - Patient > 75% Note: Goal downgraded 7/29-ESD   Problem: RH Tub/Shower Transfers Goal: LTG Patient will perform tub/shower transfers w/assist (OT) Description: LTG: Patient will perform tub/shower transfers with assist, with/without cues using equipment (OT) Flowsheets (Taken 10/17/2020 2228) LTG: Pt will perform tub/shower stall transfers with assistance level of: Minimal Assistance - Patient > 75% Note: Goal downgraded 7/29-ESD

## 2020-10-17 NOTE — Progress Notes (Signed)
Occupational Therapy Session Note  Patient Details  Name: Brent Harrington MRN: [id number] Date of Birth: Jul 30, 1950  Today's Date: 10/17/2020 OT Individual Time: 1000-1130 OT Individual Time Calculation (min): 90 min   Short Term Goals: Week 3:  OT Short Term Goal 1 (Week 3): LTG=STG 2/2 ELOS  Skilled Therapeutic Interventions/Progress Updates:    Pt greeted seated in wc and agreeable to OT treatment session. Pt reported he needed help pulling his pants up but had already washed up. Worked on sit<>stand at the sink with min A and OT providing L knee block and positioning of LLE. Pt propelled wc to therapy gym and worked on standing balance/endurance while removing unilateral hand from [place] to play Wii bowling game. Focus on maintaining even weight shift on B LE's with OT providing knee block and facilitatig weight shift Left. LB there-ex with 5 minutes on NuStep. Pt tranfserred to therapy mat with Min A sit<.stand w/ RW, but Mod A to pivot L as LLE would not budge on the floor to pivot. Partial squats with focus on weight shifting and pushin gup through LLE. Supine hip bridges with hold. Pt returned to room and left seated in wc with alarm belt on, call bell in reach, and needs met.   Therapy Documentation Precautions:  Precautions Precautions: Fall Precaution Comments: left hemi Restrictions Weight Bearing Restrictions: No Pain: Pain Assessment Pain Scale: 0-10 Pain Score: 0-No pain   Therapy/Group: Individual Therapy  Valma Cava 10/17/2020, 11:32 AM

## 2020-10-17 NOTE — Progress Notes (Signed)
Patient ID: Brent Harrington, male   DOB: [birth year], 70 y.o.   MRN: [id number]  SW met with pt in room to provide HHA, and inform on w/c being ordered. SW informed waiting on other DME recs.   SW ordered w/c with [employer] [facility] via parach[facility].   Loralee Pacas, MSW, [place] Office: [phone] Cell: [phone] Fax: [phone]

## 2020-10-17 NOTE — Progress Notes (Signed)
PROGRESS NOTE   Subjective/Complaints: No new issues. Working hard with therapies. Left foot still weak with intermittent spasms at night   ROS: Patient denies fever, rash, sore throat, blurred vision, nausea, vomiting, diarrhea, cough, shortness of breath or chest pain, joint or back pain, headache, or mood change.   Objective:   No results found. No results for input(s): WBC, HGB, HCT, PLT in the last 72 hours.   No results for input(s): NA, K, CL, CO2, GLUCOSE, BUN, CREATININE, CALCIUM in the last 72 hours.     Intake/Output Summary (Last 24 hours) at 10/17/2020 1301 Last data filed at 10/17/2020 0300 Gross per 24 hour  Intake 120 ml  Output 850 ml  Net -730 ml        Physical Exam: Vital Signs Blood pressure 97/62, pulse 75, temperature 98.4 F (36.9 C), temperature source Oral, resp. rate 18, height '5\' 11"'$  (1.803 m), weight 95.1 kg, SpO2 92 %.     Constitutional: No distress . Vital signs reviewed. HEENT: EOMI, oral membranes moist Neck: supple Cardiovascular: RRR without murmur. No JVD    Respiratory/Chest: CTA Bilaterally without wheezes or rales. Normal effort    GI/Abdomen: BS +, non-tender, non-distended Ext: no clubbing, cyanosis, or edema Psych: pleasant and cooperative  Skin: crani incision with small area with scab nearly loose Neuro: Pt is cognitively appropriate with normal insight, memory, and awareness. Cranial nerves 2-12 are intact. LLE with sl diminished LT. LUE touch only minimally diminished..   No tremors. Motor function is grossly 5/5 on right. LUE 4 to 4+/5. LLE 1  HF, 2/5 HE,  HAD,HAB, 2+/5 KE, and remains 0/5 at  ankle- no real resting tone. DTR's 3+ LLE Musculoskeletal: Full ROM, No pain with AROM or PROM in the neck, trunk, or extremities. Posture appropriate . Left heel cord tightness better   Assessment/Plan: 1. Functional deficits which require 3+ hours per day of  interdisciplinary therapy in a comprehensive inpatient rehab setting. Physiatrist is providing close team supervision and 24 hour management of active medical problems listed below. Physiatrist and rehab team continue to assess barriers to discharge/monitor patient progress toward functional and medical goals  Care Tool:  Bathing    Body parts bathed by patient: Right arm, Left arm, Chest, Abdomen, Front perineal area, Buttocks, Right upper leg, Left upper leg, Right lower leg, Left lower leg, Face   Body parts bathed by helper: Left arm     Bathing assist Assist Level: Contact Guard/Touching assist     Upper Body Dressing/Undressing Upper body dressing   What is the patient wearing?: Pull over shirt    Upper body assist Assist Level: Set up assist    Lower Body Dressing/Undressing Lower body dressing      What is the patient wearing?: Pants     Lower body assist Assist for lower body dressing: Contact Guard/Touching assist     Toileting Toileting    Toileting assist Assist for toileting: Minimal Assistance - Patient > 75%     Transfers Chair/bed transfer  Transfers assist     Chair/bed transfer assist level: Minimal Assistance - Patient > 75%     Locomotion Ambulation   Ambulation assist  Assist level: Moderate Assistance - Patient 50 - 74% Assistive device: Walker-rolling Max distance: 165 ft   Walk 10 feet activity   Assist     Assist level: Moderate Assistance - Patient - 50 - 74% Assistive device: Walker-rolling   Walk 50 feet activity   Assist Walk 50 feet with 2 turns activity did not occur: Safety/medical concerns  Assist level: Moderate Assistance - Patient - 50 - 74% Assistive device: Walker-rolling    Walk 150 feet activity   Assist Walk 150 feet activity did not occur: Safety/medical concerns  Assist level: Moderate Assistance - Patient - 50 - 74% Assistive device: Walker-rolling    Walk 10 feet on uneven surface   activity   Assist Walk 10 feet on uneven surfaces activity did not occur: Safety/medical concerns         Wheelchair     Assist Will patient use wheelchair at discharge?: No Type of Wheelchair: Manual    Wheelchair assist level: Independent Max wheelchair distance: 100 ft    Wheelchair 50 feet with 2 turns activity    Assist        Assist Level: Independent   Wheelchair 150 feet activity     Assist          Blood pressure 97/62, pulse 75, temperature 98.4 F (36.9 C), temperature source Oral, resp. rate 18, height '5\' 11"'$  (1.803 m), weight 95.1 kg, SpO2 92 %.  Medical Problem List and Plan: 1.  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  Antithrombotics: -DVT/anticoagulation:  Pharmaceutical: Heparin             -antiplatelet therapy: N/A 3. Pain Management: Tylenol as needed  7/17- added Robaxin prn and Magnesium 40 mg BID for muscle spasms-    7/28 continue baclofen bid  for LLE spasms     4. Mood: LCSW to follow for evaluation and support             -antipsychotic agents: N/AA 5. Neuropsych: This patient is capable of making decisions on his own behalf. 6. Skin/Wound Care: incison Right crani looks good small scab but otherwise closed  7. Fluids/Electrolytes/Nutrition: encourage PO  7/26 BUN improved 8.  Cerebral edema:   Decadron taper to off 9. Leukocytosis likely reactive/steroid related  -down to 7.2 7/25 10.  History of depression: Continue Lexapro  -affect has improved a lot as he's made functional gains and as he's gotten to know team.  11.  Left focal motor seizure w/aura: ?Reports that he still continues to have seizures every 2-3 weeks/last a couple of days PTA --continue Keppra 750 mg twice daily. No sz  activity seen      LOS: 16 days A FACE TO FACE EVALUATION WAS PERFORMED  Brent Harrington 10/17/2020, 1:01 PM

## 2020-10-17 NOTE — Plan of Care (Signed)
Goals Downgraded

## 2020-10-17 NOTE — Plan of Care (Signed)
  Problem: Consults Goal: RH BRAIN INJURY PATIENT EDUCATION Description: Description: See Patient Education module for eduction specifics Outcome: Progressing Goal: Skin Care Protocol Initiated - if Braden Score 18 or less Description: If consults are not indicated, leave blank or document N/A Outcome: Progressing   Problem: RH SKIN INTEGRITY Goal: RH STG ABLE TO PERFORM INCISION/WOUND CARE W/ASSISTANCE Description: STG Able To Perform Incision/Wound Care With min Assistance. Outcome: Progressing   Problem: RH SAFETY Goal: RH STG ADHERE TO SAFETY PRECAUTIONS W/ASSISTANCE/DEVICE Description: STG Adhere to Safety Precautions With min Assistance/Device. Outcome: Progressing   Problem: RH COGNITION-NURSING Goal: RH STG ANTICIPATES NEEDS/CALLS FOR ASSIST W/ASSIST/CUES Description: STG Anticipates Needs/Calls for Assist With Cues and Reminders. Outcome: Progressing   Problem: RH KNOWLEDGE DEFICIT BRAIN INJURY Goal: RH STG INCREASE KNOWLEDGE OF SELF CARE AFTER BRAIN INJURY Description: Patient will be able to demonstrate knowledge of medication management, skin/wound care, and safety awareness with educational materials and handouts provided by staff independently at discharge. Outcome: Progressing

## 2020-10-18 NOTE — Progress Notes (Signed)
Physical Therapy Session Note  Patient Details  Name: Brent Harrington MRN: [id number] Date of Birth: 04-Mar-1951  Today's Date: 10/18/2020 PT Individual Time: 0805-0847 PT Individual Time Calculation (min): 42 min   Short Term Goals: Week 3:  PT Short Term Goal 1 (Week 3): STGs = LTGs  Skilled Therapeutic Interventions/Progress Updates:   Pt received sitting in w/c putting on tennis shoes and agreeable to therapy session. Pt requires mod assist to complete donning shoes and L LE GRAFO. B UE w/c propulsion ~139f 2x to/from main therapy gym with supervision - pt has to push with L UE at a 3:1 ration compared to R UE due to L UE weakness causing impaired ability to reach back sufficiently on wheel. Block practice squat pivot transfers w/c<>EOM with pt demoing proper set-up of wheelchair including positioning, locking brakes, moving leg rests, and flipping back armrest. Pt demos good awareness to use UEs to assist with L LE positioning prior to initiating the transfer and completes transfer with close supervision for safety. Sit<>stands to/from RW with CGA/min assist for balance. Gait training ~257f2x using RW with min assist for balance but total assist for L LE swing phase advancement (therapist using gait belt around pt's thigh to advance LE) and then only verbal and tactile cuing for increased L hip/knee extension and L  weight shift during stance - achieves reciprocal stepping pattern - during 1st walk pt noted to be activating L LE extensors when attempting to activate flexors. Standing with B UE support on RW performed L LE NMR via repeated L foot taps on/off 1" step with total assist for placing foot on/off step but targeting improved motor control to not active extensors when attempting flexion movement. At end of session pt left sitting in w/c with needs in reach and seat belt alarm on.  Therapy Documentation Precautions:  Precautions Precautions: Fall Precaution Comments: left  hemi Restrictions Weight Bearing Restrictions: No   Pain: No reports of pain throughout session.   Therapy/Group: Individual Therapy  CaTawana Scale PT, DPT, NCS, CSRS 10/18/2020, 7:45 AM

## 2020-10-18 NOTE — Progress Notes (Signed)
Occupational Therapy Session Note  Patient Details  Name: Brent Harrington MRN: [id number] Date of Birth: 1950-07-07  Today's Date: 10/18/2020 OT Individual Time: 1100-1200 OT Individual Time Calculation (min): 60 min    Short Term Goals: Week 3:  OT Short Term Goal 1 (Week 3): LTG=STG 2/2 ELOS  Skilled Therapeutic Interventions/Progress Updates:    Patient greeted seated in wc and agreeable to OT treatment session focused on self-care retraining at shower level. Patient has had difficulty transferring out of shower safely to the L so Stedy used for shower transfer. Pt able to doff shoes, socks, and AFO mod I. Stedy transfer into shower with CGA. Pt completed bathing using lateral leans to wash buttocks. Worked on LB dressing strategies with pt needing min A to get LLE into figure 4 position to thread pant leg. Sit<>stand at the sink with miN A and L knee block. Wokred on strategies for donning L AFO as well without heel popping out. Pt propelled wc to therapy gym and worked on weight bearing through L LE for NMR using horse shoe rings on basketball rim and mini squats to get to the rim. Pt returned to room and left seated in wc with alarm belt on, call bell in reach, and needs met.   Therapy Documentation Precautions:  Precautions Precautions: Fall Precaution Comments: left hemi Restrictions Weight Bearing Restrictions: No Pain: Denies pain   Therapy/Group: Individual Therapy  Valma Cava 10/18/2020, 12:05 PM

## 2020-10-18 NOTE — Plan of Care (Signed)
  Problem: Consults Goal: RH BRAIN INJURY PATIENT EDUCATION Description: Description: See Patient Education module for eduction specifics Outcome: Progressing Goal: Skin Care Protocol Initiated - if Braden Score 18 or less Description: If consults are not indicated, leave blank or document N/A Outcome: Progressing   Problem: RH SKIN INTEGRITY Goal: RH STG ABLE TO PERFORM INCISION/WOUND CARE W/ASSISTANCE Description: STG Able To Perform Incision/Wound Care With min Assistance. Outcome: Progressing   Problem: RH SAFETY Goal: RH STG ADHERE TO SAFETY PRECAUTIONS W/ASSISTANCE/DEVICE Description: STG Adhere to Safety Precautions With min Assistance/Device. Outcome: Progressing   Problem: RH COGNITION-NURSING Goal: RH STG ANTICIPATES NEEDS/CALLS FOR ASSIST W/ASSIST/CUES Description: STG Anticipates Needs/Calls for Assist With Cues and Reminders. Outcome: Progressing   Problem: RH KNOWLEDGE DEFICIT BRAIN INJURY Goal: RH STG INCREASE KNOWLEDGE OF SELF CARE AFTER BRAIN INJURY Description: Patient will be able to demonstrate knowledge of medication management, skin/wound care, and safety awareness with educational materials and handouts provided by staff independently at discharge. Outcome: Progressing

## 2020-10-18 NOTE — Progress Notes (Signed)
PROGRESS NOTE   Subjective/Complaints: Listening to Beatles No complaints Denies pain Did well with therapy today   ROS: Patient denies fever, rash, sore throat, blurred vision, nausea, vomiting, diarrhea, cough, shortness of breath or chest pain, joint or back pain, headache, or mood change  Objective:   No results found. No results for input(s): WBC, HGB, HCT, PLT in the last 72 hours.   No results for input(s): NA, K, CL, CO2, GLUCOSE, BUN, CREATININE, CALCIUM in the last 72 hours.     Intake/Output Summary (Last 24 hours) at 10/18/2020 1459 Last data filed at 10/18/2020 1300 Gross per 24 hour  Intake 518 ml  Output 1320 ml  Net -802 ml        Physical Exam: Vital Signs Blood pressure 113/75, pulse 71, temperature (!) 97.5 F (36.4 C), temperature source Oral, resp. rate 17, height '5\' 11"'$  (1.803 m), weight 95.1 kg, SpO2 94 %. Gen: no distress, normal appearing HEENT: oral mucosa pink and moist, NCAT Cardio: Reg rate Chest: normal effort, normal rate of breathing Abd: soft, non-distended Ext: no edema Psych: pleasant, normal affect Skin: crani incision with small area with scab nearly loose Neuro: Pt is cognitively appropriate with normal insight, memory, and awareness. Cranial nerves 2-12 are intact. LLE with sl diminished LT. LUE touch only minimally diminished..   No tremors. Motor function is grossly 5/5 on right. LUE 4 to 4+/5. LLE 1  HF, 2/5 HE,  HAD,HAB, 2+/5 KE, and remains 0/5 at  ankle- no real resting tone. DTR's 3+ LLE Musculoskeletal: Full ROM, No pain with AROM or PROM in the neck, trunk, or extremities. Posture appropriate . Left heel cord tightness better   Assessment/Plan: 1. Functional deficits which require 3+ hours per day of interdisciplinary therapy in a comprehensive inpatient rehab setting. Physiatrist is providing close team supervision and 24 hour management of active medical problems  listed below. Physiatrist and rehab team continue to assess barriers to discharge/monitor patient progress toward functional and medical goals  Care Tool:  Bathing    Body parts bathed by patient: Right arm, Left arm, Chest, Abdomen, Front perineal area, Buttocks, Right upper leg, Left upper leg, Right lower leg, Left lower leg, Face   Body parts bathed by helper: Left arm     Bathing assist Assist Level: Contact Guard/Touching assist     Upper Body Dressing/Undressing Upper body dressing   What is the patient wearing?: Pull over shirt    Upper body assist Assist Level: Set up assist    Lower Body Dressing/Undressing Lower body dressing      What is the patient wearing?: Pants     Lower body assist Assist for lower body dressing: Contact Guard/Touching assist     Toileting Toileting    Toileting assist Assist for toileting: Minimal Assistance - Patient > 75%     Transfers Chair/bed transfer  Transfers assist     Chair/bed transfer assist level: Minimal Assistance - Patient > 75%     Locomotion Ambulation   Ambulation assist      Assist level: Moderate Assistance - Patient 50 - 74% Assistive device: Walker-rolling Max distance: 165 ft   Walk 10 feet activity  Assist     Assist level: Moderate Assistance - Patient - 50 - 74% Assistive device: Walker-rolling   Walk 50 feet activity   Assist Walk 50 feet with 2 turns activity did not occur: Safety/medical concerns  Assist level: Moderate Assistance - Patient - 50 - 74% Assistive device: Walker-rolling    Walk 150 feet activity   Assist Walk 150 feet activity did not occur: Safety/medical concerns  Assist level: Moderate Assistance - Patient - 50 - 74% Assistive device: Walker-rolling    Walk 10 feet on uneven surface  activity   Assist Walk 10 feet on uneven surfaces activity did not occur: Safety/medical concerns         Wheelchair     Assist Will patient use wheelchair  at discharge?: No Type of Wheelchair: Manual    Wheelchair assist level: Independent Max wheelchair distance: 100 ft    Wheelchair 50 feet with 2 turns activity    Assist        Assist Level: Independent   Wheelchair 150 feet activity     Assist          Blood pressure 113/75, pulse 71, temperature (!) 97.5 F (36.4 C), temperature source Oral, resp. rate 17, height '5\' 11"'$  (1.803 m), weight 95.1 kg, SpO2 94 %.  Medical Problem List and Plan: 1.  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  Antithrombotics: -DVT/anticoagulation:  Pharmaceutical: Heparin             -antiplatelet therapy: N/A 3. Pain Management: Tylenol as needed  7/17- added Robaxin prn and Magnesium 40 mg BID for muscle spasms-    7/28 continue baclofen bid  for LLE spasms     4. Mood: LCSW to follow for evaluation and support             -antipsychotic agents: N/AA 5. Neuropsych: This patient is capable of making decisions on his own behalf. 6. Skin/Wound Care: incison Right crani looks good small scab but otherwise closed  7. Fluids/Electrolytes/Nutrition: encourage PO  7/26 BUN improved 8.  Cerebral edema:   Continue Decadron taper to off 9. Leukocytosis likely reactive/steroid related  -down to 7.2 7/25 10.  History of depression: Continue Lexapro  -affect has improved a lot as he's made functional gains and as he's gotten to know team.  11.  Left focal motor seizure w/aura: ?Reports that he still continues to have seizures every 2-3 weeks/last a couple of days PTA --continue Keppra 750 mg twice daily. No sz activity seen      LOS: 17 days A FACE TO FACE EVALUATION WAS PERFORMED  Clide Deutscher Bryston Colocho 10/18/2020, 2:59 PM

## 2020-10-19 NOTE — Progress Notes (Signed)
PROGRESS NOTE   Subjective/Complaints: Feeling well No complaints Enjoyed going outside yesterday No issues overnight   ROS: Patient denies fever, rash, sore throat, blurred vision, nausea, vomiting, diarrhea, cough, shortness of breath or chest pain, joint or back pain, headache, or mood change  Objective:   No results found. No results for input(s): WBC, HGB, HCT, PLT in the last 72 hours.   No results for input(s): NA, K, CL, CO2, GLUCOSE, BUN, CREATININE, CALCIUM in the last 72 hours.     Intake/Output Summary (Last 24 hours) at 10/19/2020 1640 Last data filed at 10/19/2020 1359 Gross per 24 hour  Intake 600 ml  Output 700 ml  Net -100 ml        Physical Exam: Vital Signs Blood pressure 109/78, pulse 82, temperature (!) 97.5 F (36.4 C), temperature source Oral, resp. rate 17, height '5\' 11"'$  (1.803 m), weight 95.1 kg, SpO2 96 %. Gen: no distress, normal appearing HEENT: oral mucosa pink and moist, NCAT Cardio: Reg rate Chest: normal effort, normal rate of breathing Abd: soft, non-distended Ext: no edema Psych: pleasant, normal affect Skin: crani incision with small area with scab nearly loose Neuro: Pt is cognitively appropriate with normal insight, memory, and awareness. Cranial nerves 2-12 are intact. LLE with sl diminished LT. LUE touch only minimally diminished..   No tremors. Motor function is grossly 5/5 on right. LUE 4 to 4+/5. LLE 1  HF, 2/5 HE,  HAD,HAB, 2+/5 KE, and remains 0/5 at  ankle- no real resting tone. DTR's 3+ LLE Musculoskeletal: Full ROM, No pain with AROM or PROM in the neck, trunk, or extremities. Posture appropriate . Left heel cord tightness better   Assessment/Plan: 1. Functional deficits which require 3+ hours per day of interdisciplinary therapy in a comprehensive inpatient rehab setting. Physiatrist is providing close team supervision and 24 hour management of active medical  problems listed below. Physiatrist and rehab team continue to assess barriers to discharge/monitor patient progress toward functional and medical goals  Care Tool:  Bathing    Body parts bathed by patient: Right arm, Left arm, Chest, Abdomen, Front perineal area, Buttocks, Right upper leg, Left upper leg, Right lower leg, Left lower leg, Face   Body parts bathed by helper: Left arm     Bathing assist Assist Level: Contact Guard/Touching assist     Upper Body Dressing/Undressing Upper body dressing   What is the patient wearing?: Pull over shirt    Upper body assist Assist Level: Set up assist    Lower Body Dressing/Undressing Lower body dressing      What is the patient wearing?: Pants     Lower body assist Assist for lower body dressing: Contact Guard/Touching assist     Toileting Toileting    Toileting assist Assist for toileting: Minimal Assistance - Patient > 75%     Transfers Chair/bed transfer  Transfers assist     Chair/bed transfer assist level: Supervision/Verbal cueing (squat pivot)     Locomotion Ambulation   Ambulation assist      Assist level: Moderate Assistance - Patient 50 - 74% Assistive device: Walker-rolling Max distance: 9f   Walk 10 feet activity   Assist  Assist level: Moderate Assistance - Patient - 50 - 74% Assistive device: Walker-rolling   Walk 50 feet activity   Assist Walk 50 feet with 2 turns activity did not occur: Safety/medical concerns  Assist level: Moderate Assistance - Patient - 50 - 74% Assistive device: Walker-rolling    Walk 150 feet activity   Assist Walk 150 feet activity did not occur: Safety/medical concerns  Assist level: Moderate Assistance - Patient - 50 - 74% Assistive device: Walker-rolling    Walk 10 feet on uneven surface  activity   Assist Walk 10 feet on uneven surfaces activity did not occur: Safety/medical concerns         Wheelchair     Assist Will patient  use wheelchair at discharge?: No Type of Wheelchair: Manual    Wheelchair assist level: Independent Max wheelchair distance: 100 ft    Wheelchair 50 feet with 2 turns activity    Assist        Assist Level: Independent   Wheelchair 150 feet activity     Assist          Blood pressure 109/78, pulse 82, temperature (!) 97.5 F (36.4 C), temperature source Oral, resp. rate 17, height '5\' 11"'$  (1.803 m), weight 95.1 kg, SpO2 96 %.  Medical Problem List and Plan: 1.  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  Antithrombotics: -DVT/anticoagulation:  Pharmaceutical: Heparin             -antiplatelet therapy: N/A 3. Pain Management: Tylenol as needed  7/17- added Robaxin prn and Magnesium 40 mg BID for muscle spasms-    7/28 continue baclofen bid  for LLE spasms     4. Mood: LCSW to follow for evaluation and support             -antipsychotic agents: N/AA 5. Neuropsych: This patient is capable of making decisions on his own behalf. 6. Skin/Wound Care: incison Right crani looks good small scab but otherwise closed  7. Fluids/Electrolytes/Nutrition: encourage PO  7/26 BUN improved 8.  Cerebral edema:   Continue Decadron taper to off 9. Leukocytosis likely reactive/steroid related  -down to 7.2 7/25 10.  History of depression: Continue Lexapro  -affect has improved a lot as he's made functional gains and as he's gotten to know team.  11.  Left focal motor seizure w/aura: ?Reports that he still continues to have seizures every 2-3 weeks/last a couple of days PTA --continue Keppra 750 mg twice daily. No sz activity seen      LOS: 18 days A FACE TO FACE EVALUATION WAS PERFORMED  Brent Harrington 10/19/2020, 4:40 PM

## 2020-10-19 NOTE — Plan of Care (Signed)
  Problem: Consults Goal: RH BRAIN INJURY PATIENT EDUCATION Description: Description: See Patient Education module for eduction specifics Outcome: Progressing Goal: Skin Care Protocol Initiated - if Braden Score 18 or less Description: If consults are not indicated, leave blank or document N/A Outcome: Progressing   Problem: RH SKIN INTEGRITY Goal: RH STG ABLE TO PERFORM INCISION/WOUND CARE W/ASSISTANCE Description: STG Able To Perform Incision/Wound Care With min Assistance. Outcome: Progressing   Problem: RH SAFETY Goal: RH STG ADHERE TO SAFETY PRECAUTIONS W/ASSISTANCE/DEVICE Description: STG Adhere to Safety Precautions With min Assistance/Device. Outcome: Progressing   Problem: RH COGNITION-NURSING Goal: RH STG ANTICIPATES NEEDS/CALLS FOR ASSIST W/ASSIST/CUES Description: STG Anticipates Needs/Calls for Assist With Cues and Reminders. Outcome: Progressing   Problem: RH KNOWLEDGE DEFICIT BRAIN INJURY Goal: RH STG INCREASE KNOWLEDGE OF SELF CARE AFTER BRAIN INJURY Description: Patient will be able to demonstrate knowledge of medication management, skin/wound care, and safety awareness with educational materials and handouts provided by staff independently at discharge. Outcome: Progressing

## 2020-10-20 LAB — BASIC METABOLIC PANEL
Anion gap: 9 (ref 5–15)
BUN: 11 mg/dL (ref 8–23)
CO2: 27 mmol/L (ref 22–32)
Calcium: 8.8 mg/dL — ABNORMAL LOW (ref 8.9–10.3)
Chloride: 100 mmol/L (ref 98–111)
Creatinine, Ser: 1.09 mg/dL (ref 0.61–1.24)
GFR, Estimated: >60 mL/min (ref >60)
Glucose, Bld: 92 mg/dL (ref 70–99)
Potassium: 3.8 mmol/L (ref 3.5–5.1)
Sodium: 136 mmol/L (ref 135–145)

## 2020-10-20 LAB — CBC
HCT: 38.4 % — ABNORMAL LOW (ref 39.0–52.0)
Hemoglobin: 12.6 g/dL — ABNORMAL LOW (ref 13.0–17.0)
MCH: 28.6 pg (ref 26.0–34.0)
MCHC: 32.8 g/dL (ref 30.0–36.0)
MCV: 87.1 fL (ref 80.0–100.0)
Platelets: 266 10*3/uL (ref 150–400)
RBC: 4.41 MIL/uL (ref 4.22–5.81)
RDW: 12.9 % (ref 11.5–15.5)
WBC: 7.1 10*3/uL (ref 4.0–10.5)
nRBC: 0 % (ref 0.0–0.2)

## 2020-10-20 NOTE — Progress Notes (Signed)
Recreational Therapy Discharge Summary Patient Details  Name: Brent Harrington MRN: [id number] Date of Birth: [birth year] Today's Date: 10/20/2020  Long term goals set: 1 1 Long term goals met: 1  Comments on progress toward goals: Pt has made good progress during LOS and is discharging home with family to coordinate 24 hour supervision/assistance.  TR sessions focused on leisure education, activity analysis identifying potential modifications, coping and community reintegration.  Pt participated in a community outing at [employer] assist w/c level, meeting LTG.  Reasons for discharge: discharge from hospital  Patient/family agrees with progress made and goals achieved: Yes  [name] 10/20/2020, 11:11 AM

## 2020-10-20 NOTE — Progress Notes (Signed)
PROGRESS NOTE   Subjective/Complaints: No new issues. Enjoyed being outside this weekend. Family ed today  ROS: Patient denies fever, rash, sore throat, blurred vision, nausea, vomiting, diarrhea, cough, shortness of breath or chest pain, joint or back pain, headache, or mood change.   Objective:   No results found. Recent Labs    10/20/20 0440  WBC 7.1  HGB 12.6*  HCT 38.4*  PLT 266     Recent Labs    10/20/20 0440  NA 136  K 3.8  CL 100  CO2 27  GLUCOSE 92  BUN 11  CREATININE 1.09  CALCIUM 8.8*       Intake/Output Summary (Last 24 hours) at 10/20/2020 0953 Last data filed at 10/20/2020 0731 Gross per 24 hour  Intake 873 ml  Output 950 ml  Net -77 ml        Physical Exam: Vital Signs Blood pressure 94/68, pulse 67, temperature 98.3 F (36.8 C), resp. rate 18, height '5\' 11"'$  (1.803 m), weight 95.1 kg, SpO2 93 %. Constitutional: No distress . Vital signs reviewed. HEENT: EOMI, oral membranes moist Neck: supple Cardiovascular: RRR without murmur. No JVD    Respiratory/Chest: CTA Bilaterally without wheezes or rales. Normal effort    GI/Abdomen: BS +, non-tender, non-distended Ext: no clubbing, cyanosis, or edema Psych: pleasant and cooperative Skin: crani incision clean Neuro: Pt is cognitively appropriate with normal insight, memory, and awareness. Cranial nerves 2-12 are intact. LLE with sl diminished LT. LUE touch only minimally diminished..   No tremors. Motor function is grossly 5/5 on right. LUE 4 to 4+/5. LLE 1  HF, 2/5 HE,  HAD,HAB, 2+/5 KE, and remains 0/5 at  ankle. No real change.  DTR's 3+ LLE Musculoskeletal: Full ROM, No pain with AROM or PROM in the neck, trunk, or extremities. Posture appropriate . Left heel cord tightness improving   Assessment/Plan: 1. Functional deficits which require 3+ hours per day of interdisciplinary therapy in a comprehensive inpatient rehab  setting. Physiatrist is providing close team supervision and 24 hour management of active medical problems listed below. Physiatrist and rehab team continue to assess barriers to discharge/monitor patient progress toward functional and medical goals  Care Tool:  Bathing    Body parts bathed by patient: Right arm, Left arm, Chest, Abdomen, Front perineal area, Buttocks, Right upper leg, Left upper leg, Right lower leg, Left lower leg, Face   Body parts bathed by helper: Left arm     Bathing assist Assist Level: Contact Guard/Touching assist     Upper Body Dressing/Undressing Upper body dressing   What is the patient wearing?: Pull over shirt    Upper body assist Assist Level: Set up assist    Lower Body Dressing/Undressing Lower body dressing      What is the patient wearing?: Pants     Lower body assist Assist for lower body dressing: Contact Guard/Touching assist     Toileting Toileting    Toileting assist Assist for toileting: Minimal Assistance - Patient > 75%     Transfers Chair/bed transfer  Transfers assist     Chair/bed transfer assist level: Supervision/Verbal cueing (squat pivot)     Locomotion Ambulation  Ambulation assist      Assist level: Moderate Assistance - Patient 50 - 74% Assistive device: Walker-rolling Max distance: 60f   Walk 10 feet activity   Assist     Assist level: Moderate Assistance - Patient - 50 - 74% Assistive device: Walker-rolling   Walk 50 feet activity   Assist Walk 50 feet with 2 turns activity did not occur: Safety/medical concerns  Assist level: Moderate Assistance - Patient - 50 - 74% Assistive device: Walker-rolling    Walk 150 feet activity   Assist Walk 150 feet activity did not occur: Safety/medical concerns  Assist level: Moderate Assistance - Patient - 50 - 74% Assistive device: Walker-rolling    Walk 10 feet on uneven surface  activity   Assist Walk 10 feet on uneven surfaces  activity did not occur: Safety/medical concerns         Wheelchair     Assist Will patient use wheelchair at discharge?: No Type of Wheelchair: Manual    Wheelchair assist level: Independent Max wheelchair distance: 100 ft    Wheelchair 50 feet with 2 turns activity    Assist        Assist Level: Independent   Wheelchair 150 feet activity     Assist          Blood pressure 94/68, pulse 67, temperature 98.3 F (36.8 C), resp. rate 18, height '5\' 11"'$  (1.803 m), weight 95.1 kg, SpO2 93 %.  Medical Problem List and Plan: 1.  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  Antithrombotics: -DVT/anticoagulation:  Pharmaceutical: Heparin             -antiplatelet therapy: N/A 3. Pain Management: Tylenol as needed   Robaxin prn and Magnesium 40 mg BID for muscle spasms-      baclofen bid  for LLE spasms     4. Mood: LCSW to follow for evaluation and support             -antipsychotic agents: N/AA 5. Neuropsych: This patient is capable of making decisions on his own behalf. 6. Skin/Wound Care: incison Right crani looks good small scab but otherwise closed  7. Fluids/Electrolytes/Nutrition: encourage PO  I personally reviewed the patient's labs today.  All wnl 8.  Cerebral edema:   Continue Decadron taper to off 9. Leukocytosis likely reactive/steroid related  -down to 7.2 7/25 10.  History of depression: Continue Lexapro  -affect has improved a lot as he's made functional gains and as he's gotten to know team.  11.  Left focal motor seizure w/aura: ?Reports that he still continues to have seizures every 2-3 weeks/last a couple of days PTA --continue Keppra 750 mg twice daily. No sz activity seen      LOS: 19 days A FACE TO FACE  EVALUATION WAS PERFORMED  ZMeredith Staggers8/03/2020, 9:53 AM

## 2020-10-20 NOTE — Progress Notes (Signed)
Physical Therapy Session Note  Patient Details  Name: Brent Harrington MRN: [id number] Date of Birth: 12-02-1950  Today's Date: 10/20/2020 PT Individual Time: 1100-1200 PT Individual Time Calculation (min): 60 min   Short Term Goals: Week 3:  PT Short Term Goal 1 (Week 3): STGs = LTGs  Skilled Therapeutic Interventions/Progress Updates:     Patient in w/c in the room with his wife and daughter present for family education upon PT arrival. Patient alert and agreeable to PT session. Patient denied pain during session.  Patient's wife and daughter participated in hands on training throughout session. PT provided demonstration assist for transfers to various household surfaces and his family provided safe guarding with all transfers and mobility during session. Educated patient on fall risk/prevention, home modifications to prevent falls, and activation of emergency services in the event of a fall during session. Informed his family that the patient has initiated fall recovery with PT and to ask to continue with this training with HHPT. Educated on patient's high fall risk due to hemiplegia and impulsivity. Family did well with providing safety cues and communicating with patient about safety awareness, following PT demonstration, during session.   Therapeutic Activity: Transfers: Patient performed sit to/from stand x3 with CGA. Provided verbal cues for hand placement, foot placement, and forward weight shift. Educated on using sit-to stand for clothing management, toileting, and pressure relief as weight bearing will be a good intervention for his hemiparetic limb. Instructs not to have patient turn to transfer with RW due to motor planning deficits and safety concerns, patient and family in agreement.  Patient performed squat pivot transfers w/c<>mat table, w/c<>simulated bed height with hospital bed, and w/c<>hospital recliner to simulate lift chair with supervision and min A-supervision for w/c  set-up. Provided cues for hand placement, w/c set-up, and w/c parts management for proper technique and decreased assist with transfers.  Patient performed a simulated sedan height car transfer with supervision using car frame (simulating car handle). Provided min cues for safe technique.  Gait Training:  Patient ambulated 38 feet using RW with min A for trunk support and total-max A for L limb advancement with AFO and toe cap due to decreased motor planning (frequently pushing down rather than lifting up, improved with repetition). Provided max multimodal cues for forward and lateral weight shift, progressing of RW, increased gluteal activation in stance, hamstring activation in pre-swing, and reciprocal gait pattern.  Educated patient and family on safety concerns with ambulating at home at this time and recommended no ambulation at this time. Patient to progress with ambulation with HHPT, patient and family in agreement.   Wheelchair Mobility:  Patient propelled wheelchair >150 feet with mod I and managed w/c parts with supervision for cuing for removing and returning arm rest for transfers. Patient propelled backwards while ascending and forwards while descending a 10 ft ramp with CGA using B upper extremities and his R leg.   Patient in w/c with his family in the room at end of session with breaks locked, seat belt alarm set, and all needs within reach.   Therapy Documentation Precautions:  Precautions Precautions: Fall Precaution Comments: left hemi Restrictions Weight Bearing Restrictions: No   Therapy/Group: Individual Therapy  Qusai Kem L Briggette Najarian PT, DPT  10/20/2020, 4:28 PM

## 2020-10-20 NOTE — Progress Notes (Signed)
Patient ID: Maleki Hippe, male   DOB: 1950-05-30, 70 y.o.   MRN: [id number]  SW ordered DME: TTB, RW, 3in1 BSC with [employer] via parachute.   SW met with pt and family at request from therapy reporting pt family had questions. SW reviewed d/c DME, and HHA preference. [facility]HA preference-1) [facility], 2) [facility]. SW informed will explore [facility]. SW informed pt has RW, and did not another. SW provided contact information for [facility] after reports of trouble contacting the pt.   SW cancelled RW.  SW spoke with Sharon/[facility] to discuss referral. Reports not in contract with [employer]. SW sent HHPT/OT referral to Angie/[facility] and waiting on follow-up.    Loralee Pacas, MSW, [facility] Office: [phone] Cell: [phone] Fax: [phone]

## 2020-10-20 NOTE — Progress Notes (Signed)
Occupational Therapy Session Note  Patient Details  Name: Braelyn Bordonaro MRN: [id number] Date of Birth: Sep 25, 1950  Today's Date: 10/20/2020 Session 1 OT Individual Time: 1002-1100 OT Individual Time Calculation (min): 58 min   Session 2 OT Individual Time: 1430-1530 OT Individual Time Calculation (min): 60 min    Short Term Goals: Week 3:  OT Short Term Goal 1 (Week 3): LTG=STG 2/2 ELOS  Skilled Therapeutic Interventions/Progress Updates:    Session 1 Pt greeted seated in wc with sister and daughter present for family education. Pt propelled wc to therapy apartment and practiced tub bench transfer in simulated home environment. Educated on knee blocking and body mechanics within squat-pivot transfers as well as use of gait belt. Daughter and sister both practiced transfers. Educated on Scientist, research (physical sciences) bars and hand held shower hose. Educated on squat-pivots to drop arm BSC over toilet and then doing clothing management once pt is on commode. Practiced transfer to and from bed squat-pivot. Educated on knee block with sit<>stands at the sink as well. Discussed BADL and iADL modifications from wc level for safety. Pt propelled wc back to room and left seated in wc with alarm belt on, family present, and needs met.   Session 2 Pt greeted seated in wc with family present. Pt's DME had been delivered. OT set-up wc and educated on how to fold up wc for transport. Squat-pivot over to new wc with min A. Educated on new wc functions and adjusted leg rests for patient. Pt then propelled wc to nurses station and back to check fit. Pt returned to room and completed squat-pivot back to old wc so family could take his wc home. Pt propelled wc to therapy gym and worked on functional ambulation in parallel bars. Focus on weight shifting to help bring LLE through when stepping forward and back. Pt completed 10 mins on NuSTep for LB strengthening. Strap placed around thighs to keep L hip from externally rotating.  Pt pivoted back to wc with min A and propelled back to room. Min A to pivot to recliner and pt left seated in recliner with alarm pad on, call bell in reach, and needs met.   Therapy Documentation Precautions:  Precautions Precautions: Fall Precaution Comments: left hemi Restrictions Weight Bearing Restrictions: No Pain:  Denies pain     Therapy/Group: Individual Therapy  Valma Cava 10/20/2020, 3:40 PM

## 2020-10-21 NOTE — Patient Care Conference (Signed)
Inpatient RehabilitationTeam Conference and Plan of Care Update Date: 10/21/2020   Time: 10:33 AM    Patient Name: Brent Harrington      Medical Record Number: [id number]  Date of Birth: 02-Feb-1951 Sex: Male         Room/Bed: 4W01C/4W01C-01 Payor Info: Payor: [employer] MEDICARE / Plan: [employer] MEDICARE HMO/PPO / Product Type: *No Product type* /    Admit Date/Time:  10/01/2020  3:57 PM  Primary Diagnosis:  Meningioma, cerebral [facility])  Hospital Problems: Principal Problem:   Meningioma, cerebral [facility])    Expected Discharge Date: Expected Discharge Date: 10/22/20  Team Members Present: Physician leading conference: Dr. Alger Simons Social Worker Present: Loralee Pacas, Isabela Nurse Present: Dorthula Nettles, RN PT Present: Tereasa Coop, PT OT Present: Cherylynn Ridges, OT PPS Coordinator present : Gunnar Fusi, SLP     Current Status/Progress Goal Weekly Team Focus  Bowel/Bladder   continent B/B  regular BMs every 3 days or less      Swallow/Nutrition/ Hydration             ADL's   Supervision/CGA  Supervision/CGA  LLE NMR, dc planning, transfers   Mobility   supervision bed mobility, supervision to mod(I) with transfers and WC mobility  CGA  DC prep   Communication             Safety/Cognition/ Behavioral Observations            Pain   denies pain  Pain level <3/10      Skin   ecchymosis to abdomen  No new breakdown        Discharge Planning:  D/c to home with son and dtr who live in the home. His sister Hassan Rowan to be primary caregiver during the day since she is retired, and dtr in in nursing program. Heriberto Antigua edu completed on 8/1.   Team Discussion: Medically ready for discharge. Continent B/B, incision to scalp is CDI, bruised abdomen. PT and OT report patient is ready for discharge. Patient on target to meet rehab goals: yes  *See Care Plan and progress notes for long and short-term goals.   Revisions to Treatment Plan:  Ready for discharge  Teaching Needs: Family  education complete.  Current Barriers to Discharge: Decreased caregiver support, Medical stability, Home enviroment access/layout, Wound care, Lack of/limited family support, Medication compliance, and Behavior  Possible Resolutions to Barriers: Continue current medications, provide emotional support.     Medical Summary Current Status: improving motor. distal RLE weakness. pain controlled  Barriers to Discharge: Medical stability   Possible Resolutions to Barriers/Weekly Focus: daily assessment of labs and pt data   Continued Need for Acute Rehabilitation Level of Care: The patient requires daily medical management by a physician with specialized training in physical medicine and rehabilitation for the following reasons: Direction of a multidisciplinary physical rehabilitation program to maximize functional independence : Yes Medical management of patient stability for increased activity during participation in an intensive rehabilitation regime.: Yes Analysis of laboratory values and/or radiology reports with any subsequent need for medication adjustment and/or medical intervention. : Yes   I attest that I was present, lead the team conference, and concur with the assessment and plan of the team.   Cristi Loron 10/21/2020, 4:19 PM

## 2020-10-21 NOTE — Plan of Care (Signed)
  Problem: Sit to Stand Goal: LTG:  Patient will perform sit to stand in prep for activites of daily living with assistance level (OT) Description: LTG:  Patient will perform sit to stand in prep for activites of daily living with assistance level (OT) Outcome: Completed/Met   Problem: RH Eating Goal: LTG Patient will perform eating w/assist, cues/equip (OT) Description: LTG: Patient will perform eating with assist, with/without cues using equipment (OT) Outcome: Completed/Met   Problem: RH Grooming Goal: LTG Patient will perform grooming w/assist,cues/equip (OT) Description: LTG: Patient will perform grooming with assist, with/without cues using equipment (OT) Outcome: Completed/Met   Problem: RH Bathing Goal: LTG Patient will bathe all body parts with assist levels (OT) Description: LTG: Patient will bathe all body parts with assist levels (OT) Outcome: Completed/Met   Problem: RH Dressing Goal: LTG Patient will perform upper body dressing (OT) Description: LTG Patient will perform upper body dressing with assist, with/without cues (OT). Outcome: Completed/Met Goal: LTG Patient will perform lower body dressing w/assist (OT) Description: LTG: Patient will perform lower body dressing with assist, with/without cues in positioning using equipment (OT) Outcome: Completed/Met   Problem: RH Toileting Goal: LTG Patient will perform toileting task (3/3 steps) with assistance level (OT) Description: LTG: Patient will perform toileting task (3/3 steps) with assistance level (OT)  Outcome: Completed/Met   Problem: RH Functional Use of Upper Extremity Goal: LTG Patient will use RT/LT upper extremity as a (OT) Description: LTG: Patient will use right/left upper extremity as a stabilizer/gross assist/diminished/nondominant/dominant level with assist, with/without cues during functional activity (OT) Outcome: Completed/Met   Problem: RH Toilet Transfers Goal: LTG Patient will perform toilet  transfers w/assist (OT) Description: LTG: Patient will perform toilet transfers with assist, with/without cues using equipment (OT) Outcome: Completed/Met   Problem: RH Tub/Shower Transfers Goal: LTG Patient will perform tub/shower transfers w/assist (OT) Description: LTG: Patient will perform tub/shower transfers with assist, with/without cues using equipment (OT) Outcome: Completed/Met   Problem: RH Awareness Goal: LTG: Patient will demonstrate awareness during functional activites type of (OT) Description: LTG: Patient will demonstrate awareness during functional activites type of (OT) Outcome: Completed/Met

## 2020-10-21 NOTE — Progress Notes (Addendum)
Patient ID: Rakeim Warhurst, male   DOB: 1950/09/20, 70 y.o.   MRN: [id number]  SW received updates from [facility] reporting they are unable to accept referral since they do not service [place] area.   HHPT/OT referral declined by St[facility] as they do not service pt area.   SW sent HHPT/OT referral to [facility] and waiting on follow-up.   SW called pt sister Hassan Rowan [phone]) to inform on above challenges with HHA. She also states they have found a way to get him into the room since the w/c will not fit through the bathroom doorways and pt will sponge bathe. SW informed will f/u tomorrow to share with pt if HHA was selected to avoid increasing his anxiety.   *referral accepted by [facility].   Loralee Pacas, MSW, [place] Office: [phone] Cell: [phone] Fax: [phone]

## 2020-10-21 NOTE — Progress Notes (Signed)
PROGRESS NOTE   Subjective/Complaints: Pt up with therapy early. No new problems today  ROS: Patient denies fever, rash, sore throat, blurred vision, nausea, vomiting, diarrhea, cough, shortness of breath or chest pain, joint or back pain, headache, or mood change.   Objective:   No results found. Recent Labs    10/20/20 0440  WBC 7.1  HGB 12.6*  HCT 38.4*  PLT 266     Recent Labs    10/20/20 0440  NA 136  K 3.8  CL 100  CO2 27  GLUCOSE 92  BUN 11  CREATININE 1.09  CALCIUM 8.8*       Intake/Output Summary (Last 24 hours) at 10/21/2020 0919 Last data filed at 10/21/2020 0555 Gross per 24 hour  Intake 240 ml  Output 1400 ml  Net -1160 ml        Physical Exam: Vital Signs Blood pressure 97/77, pulse 66, temperature 98.3 F (36.8 C), temperature source Oral, resp. rate 16, height '5\' 11"'$  (1.803 m), weight 95.1 kg, SpO2 97 %. Constitutional: No distress . Vital signs reviewed. HEENT: EOMI, oral membranes moist Neck: supple Cardiovascular: RRR without murmur. No JVD    Respiratory/Chest: CTA Bilaterally without wheezes or rales. Normal effort    GI/Abdomen: BS +, non-tender, non-distended Ext: no clubbing, cyanosis, or edema Psych: pleasant and cooperative  Skin: crani incision clean Neuro: Pt is cognitively appropriate with normal insight, memory, and awareness. Cranial nerves 2-12 are intact. LLE with sl diminished LT. LUE touch only minimally diminished..   No tremors. Motor function is grossly 5/5 on right. LUE 4 to 4+/5. LLE 1/5  HF, 2/5 HE,  HAD,HAB, 2+/5 KE, and remains 0/5 at  ankle.   DTR's 3+ LLE Musculoskeletal: Full ROM, No pain with AROM or PROM in the neck, trunk, or extremities. Posture appropriate . Left heel cord tightness improving   Assessment/Plan: 1. Functional deficits which require 3+ hours per day of interdisciplinary therapy in a comprehensive inpatient rehab setting. Physiatrist  is providing close team supervision and 24 hour management of active medical problems listed below. Physiatrist and rehab team continue to assess barriers to discharge/monitor patient progress toward functional and medical goals  Care Tool:  Bathing    Body parts bathed by patient: Right arm, Left arm, Chest, Abdomen, Front perineal area, Buttocks, Right upper leg, Left upper leg, Right lower leg, Left lower leg, Face   Body parts bathed by helper: Left arm     Bathing assist Assist Level: Supervision/Verbal cueing     Upper Body Dressing/Undressing Upper body dressing   What is the patient wearing?: Pull over shirt    Upper body assist Assist Level: Independent    Lower Body Dressing/Undressing Lower body dressing      What is the patient wearing?: Pants     Lower body assist Assist for lower body dressing: Supervision/Verbal cueing     Toileting Toileting    Toileting assist Assist for toileting: Supervision/Verbal cueing     Transfers Chair/bed transfer  Transfers assist     Chair/bed transfer assist level: Supervision/Verbal cueing     Locomotion Ambulation   Ambulation assist      Assist level: Moderate  Assistance - Patient 50 - 74% Assistive device: Walker-rolling Max distance: 37 ft   Walk 10 feet activity   Assist     Assist level: Moderate Assistance - Patient - 50 - 74% Assistive device: Walker-rolling   Walk 50 feet activity   Assist Walk 50 feet with 2 turns activity did not occur: Safety/medical concerns  Assist level: Moderate Assistance - Patient - 50 - 74% Assistive device: Walker-rolling    Walk 150 feet activity   Assist Walk 150 feet activity did not occur: Safety/medical concerns  Assist level: Moderate Assistance - Patient - 50 - 74% Assistive device: Walker-rolling    Walk 10 feet on uneven surface  activity   Assist Walk 10 feet on uneven surfaces activity did not occur: Safety/medical concerns          Wheelchair     Assist Will patient use wheelchair at discharge?: No Type of Wheelchair: Manual    Wheelchair assist level: Independent Max wheelchair distance: >150 ft    Wheelchair 50 feet with 2 turns activity    Assist        Assist Level: Independent   Wheelchair 150 feet activity     Assist      Assist Level: Independent   Blood pressure 97/77, pulse 66, temperature 98.3 F (36.8 C), temperature source Oral, resp. rate 16, height '5\' 11"'$  (1.803 m), weight 95.1 kg, SpO2 97 %.  Medical Problem List and Plan: 1.  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  Antithrombotics: -DVT/anticoagulation:  Pharmaceutical: Heparin--dc tomorrow             -antiplatelet therapy: N/A 3. Pain Management: Tylenol as needed   Robaxin prn and Magnesium 40 mg BID for muscle spasms-      baclofen bid  for LLE spasms     4. Mood: LCSW to follow for evaluation and support             -antipsychotic agents: N/AA 5. Neuropsych: This patient is capable of making decisions on his own behalf. 6. Skin/Wound Care: incison Right crani looks good small scab but otherwise closed  7. Fluids/Electrolytes/Nutrition: encourage PO  I personally reviewed the patient's labs today.  All wnl 8.  Cerebral edema:   Continue Decadron taper to off 9. Leukocytosis likely reactive/steroid related  -resolved 10.  History of depression: Continue Lexapro  -affect has improved a lot as he's made functional gains and as he's gotten to know team.  11.  Left focal motor seizure w/aura: ?Reports that he still continues to have seizures every 2-3 weeks/last a couple of days PTA --continue Keppra 750 mg twice daily. No sz activity seen      LOS: 20 days A FACE TO FACE  EVALUATION WAS PERFORMED  Meredith Staggers 10/21/2020, 9:19 AM

## 2020-10-21 NOTE — Progress Notes (Signed)
Physical Therapy Discharge Summary  Patient Details  Name: Brent Harrington MRN: [id number] Date of Birth: [birth year]  Today's Date: 10/21/2020 PT Individual Time: 0932-1030 and 7672-0947 PT Individual Time Calculation (min): 58 min    Patient has met 7 of 8 long term goals due to improved activity tolerance, improved balance, improved postural control, increased strength, ability to compensate for deficits, improved awareness, and improved coordination.  Patient to discharge at a wheelchair level Supervision.   Patient's care partner is independent to provide the necessary physical assistance at discharge.  Reasons goals not met: Pt did not meet stair goal but had ramp installed and will not need to complete stairs at this time.  Recommendation:  Patient will benefit from ongoing skilled PT services in home health setting to continue to advance safe functional mobility, address ongoing impairments in strength, balance, L hemibody NMR, ambulation, and minimize fall risk.  Equipment: 18' x 18" manual WC, RW  Reasons for discharge: discharge from hospital  Patient/family agrees with progress made and goals achieved: Yes  Skilled Therapeutic Interventions:  1st session: Pt received seated in [facility] and agrees to therapy. No complaint of pain. Pt self propels WC throughout session independently, >200'. Pt practices maneuvering WC next to mat table and performing squat pivot transfer while managing WC parts, requiring occasional verbal cues for safety and WC parts management, but no physical assistance required. Pt performs car transfer with cues on body mechanics and positioning, with extra cueing on managing L leg and ensuring leg positioned optimally prior to transfer. Pt propels WC up/down ramp independently. Pt transfers to Nustep and completes x10:00 on workload of 4. Performed for strength and endurance training as well as reciprocal coordination. Squat pivot from nustep>WC>recliner with  supervision and cues on positioning. Left with alarm intact and all needs within reach.    2nd Session: Pt received seated in recliner and agrees to therapy. Squat pivot to WC with cues on body mechanics. WC transport to gym for time management. Pt ambulates x50' with RW and modA, primarily for management of L lower extremity and progression of L leg during swing phase. PT also facilitates R lateral weight shift to fully unweight L leg for swing phase. Pt performs sit to supine on mat table with cues for positioning and use of momentum. Pt performs 3x10 SAQs with bilateral lower extremities. 2nd 2 sets pt uses 4lb ankle weight on L leg to provide increased NM feedback to allow for increased control due to pt lack of motor control. Pt demos smoother, more coordinated movements with weights in place. Pt performs AAROM SLRs with L leg and PT notes palpable contraction of L hip flexor, through no movement associated, likely associated to cocontraction of L hip extensors due to lack of motor control. PT provides pt with HEP and answers questions regarding performs. Supine to sit with cues for logrolling technique. Squat pivot back to [facility] with supervision. Left with all needs within reach.  PT Discharge Precautions/Restrictions Precautions Precautions: Fall Precaution Comments: left hemi Vision/Perception  Perception Perception: Impaired Inattention/Neglect:  (mild L inattention, improved from eval) Praxis Praxis: Intact  Cognition Overall Cognitive Status: Within Functional Limits for tasks assessed Arousal/Alertness: Awake/alert Orientation Level: Oriented X4 Safety/Judgment: Appears intact Sensation Sensation Light Touch Impaired Details: Impaired LLE Coordination Gross Motor Movements are Fluid and Coordinated: No Fine Motor Movements are Fluid and Coordinated: Yes Coordination and Movement Description: increased smoothness and accuracy Motor  Motor Motor: Hemiplegia Motor - Skilled  Clinical Observations: L hemiplegia LE>UE  Mobility Bed Mobility Bed Mobility: Supine to Sit;Sit to Supine Supine to Sit: Supervision/Verbal cueing Sit to Supine: Supervision/Verbal cueing Transfers Transfers: Squat Pivot Transfers;Sit to Stand;Stand to Sit Sit to Stand: Contact Guard/Touching assist Stand to Sit: Contact Guard/Touching assist Stand Pivot Transfers: Contact Guard/Touching assist Stand Pivot Transfer Details: Tactile cues for initiation;Tactile cues for posture;Verbal cues for sequencing;Verbal cues for technique;Tactile cues for sequencing;Tactile cues for placement;Verbal cues for precautions/safety;Manual facilitation for weight bearing;Manual facilitation for placement Squat Pivot Transfers: Supervision/Verbal cueing Transfer (Assistive device): None Locomotion  Gait Ambulation: Yes Gait Assistance: Moderate Assistance - Patient 50-74% Gait Distance (Feet): 50 Feet Assistive device: Rolling walker Gait Assistance Details: Manual facilitation for weight bearing;Manual facilitation for placement;Verbal cues for gait pattern;Verbal cues for sequencing;Verbal cues for technique;Tactile cues for posture;Tactile cues for initiation Gait Gait: Yes Gait Pattern: Impaired Gait Pattern:  (L Hemi gait) Gait velocity: decreased Stairs / Additional Locomotion Stairs: No Wheelchair Mobility Wheelchair Mobility: Yes Wheelchair Assistance: Independent with Camera operator: Both upper extremities Distance: 150'  Trunk/Postural Assessment  Cervical Assessment Cervical Assessment:  (forward head) Thoracic Assessment Thoracic Assessment:  (rounded shoulders) Lumbar Assessment Lumbar Assessment:  (posterior pelvic tilt) Postural Control Postural Control: Deficits on evaluation (L hemi, increased postural sway. Improved from eval)  Balance Static Sitting Balance Static Sitting - Balance Support: Feet supported Static Sitting - Level of Assistance: 7:  Independent Dynamic Sitting Balance Dynamic Sitting - Balance Support: During functional activity Dynamic Sitting - Level of Assistance: 7: Independent Static Standing Balance Static Standing - Balance Support: During functional activity Static Standing - Level of Assistance: 5: Stand by assistance Dynamic Standing Balance Dynamic Standing - Balance Support: During functional activity Dynamic Standing - Level of Assistance: 4: Min assist (CGA) Extremity Assessment  RUE Assessment RUE Assessment: Within Functional Limits LUE Assessment LUE Assessment: Exceptions to [facility] General Strength Comments: grossly 4/5 overall RLE Assessment RLE Assessment: Within Functional Limits LLE Strength Left Hip Flexion: 1/5 Left Hip Extension: 2+/5 Left Hip ABduction: 0/5 Left Hip ADduction: 3/5 Left Knee Flexion: 0/5 Left Knee Extension: 3+/5 Left Ankle Dorsiflexion: 0/5 Left Ankle Plantar Flexion: 2-/5    Breck Coons, PT, DPT 10/21/2020, 4:15 PM

## 2020-10-21 NOTE — Progress Notes (Signed)
Occupational Therapy Discharge Summary  Patient Details  Name: Brent Harrington MRN: [id number] Date of Birth: [birth year]  Patient has met 11 of 11 long term goals due to improved activity tolerance, improved balance, postural control, ability to compensate for deficits, functional use of  LEFT upper and LEFT lower extremity, improved attention, and improved coordination.  Patient to discharge at overall Supervision/CGA level.  Patient's care partner is independent to provide the necessary physical assistance at discharge.    Reasons goals not met: n/a  Recommendation:  Patient will benefit from ongoing skilled OT services in home health setting to continue to advance functional skills in the area of BADL and functional use of L UE .  Equipment: Tub transfer bench, drop arm 3-in-1 BSC, wheelchair  Reasons for discharge: treatment goals met and discharge from hospital  Patient/family agrees with progress made and goals achieved: Yes  OT Discharge Precautions/Restrictions  Precautions Precautions: Fall Precaution Comments: left hemi Pain  Denies pain ADL ADL Eating: Independent Grooming: Independent Upper Body Bathing: Independent Lower Body Bathing: Supervision/safety Upper Body Dressing: Independent Lower Body Dressing: Supervision/safety Toileting: Supervision/safety Toilet Transfer: Close supervision Tub/Shower Transfer: Minimal assistance Cognition Overall Cognitive Status: Within Functional Limits for tasks assessed Arousal/Alertness: Awake/alert Orientation Level: Oriented X4 Safety/Judgment: Appears intact Sensation Sensation Light Touch Impaired Details: Impaired LLE Coordination Gross Motor Movements are Fluid and Coordinated: No Fine Motor Movements are Fluid and Coordinated: Yes Coordination and Movement Description: increased smoothness and accuracy Motor  Motor Motor: Hemiplegia Motor - Skilled Clinical Observations: L hemiplegia LE>UE Mobility   Transfers Sit to Stand: Contact Guard/Touching assist  Chiropodist Sitting - Balance Support: Feet supported Static Sitting - Level of Assistance: 7: Independent Dynamic Sitting Balance Dynamic Sitting - Balance Support: During functional activity Dynamic Sitting - Level of Assistance: 7: Independent Static Standing Balance Static Standing - Balance Support: During functional activity Static Standing - Level of Assistance: 5: Stand by assistance Dynamic Standing Balance Dynamic Standing - Balance Support: During functional activity Dynamic Standing - Level of Assistance: 4: Min assist (CGA) Extremity/Trunk Assessment RUE Assessment RUE Assessment: Within Functional Limits LUE Assessment LUE Assessment: Exceptions to [facility] General Strength Comments: grossly 4/5 overall   Daneen Schick Sesilia Poucher 10/21/2020, 3:28 PM

## 2020-10-21 NOTE — Progress Notes (Signed)
Occupational Therapy Session Note  Patient Details  Name: Casimer Russett MRN: [id number] Date of Birth: 04/08/1950  Today's Date: 10/21/2020 OT Individual Time: 0730-0900 OT Individual Time Calculation (min): 90 min   Short Term Goals: Week 3:  OT Short Term Goal 1 (Week 3): LTG=STG 2/2 ELOS  Skilled Therapeutic Interventions/Progress Updates:    Pt greeted seated in wc and agreeable to OT treatment session focused on self-care retraining. Pt peopelled wc into bathroom and completed squat-pivots in and out of shower with close supervision/CGA. Bathing using lateral leans and overall supervision. Pt was able to achieve figure 4 position to don pants and thread TED hose today using friction reducing device. Pt propelled wc to therapy gym and OT issued UE home exercise program and home fine motor program. OT also issued level 3 green theraband and completed UE home exercise program with pt. Reviewed home fine motor program as well. Pt propelled wc back to room and pt left seated in wc with alarm belt on, call bell in reach and needs met.   Therapy Documentation Precautions:  Precautions Precautions: Fall Precaution Comments: left hemi Restrictions Weight Bearing Restrictions: No Pain:  Denies pain   Therapy/Group: Individual Therapy  Valma Cava 10/21/2020, 2:30 PM

## 2020-10-22 DIAGNOSIS — R252 Cramp and spasm: Secondary | ICD-10-CM

## 2020-10-22 MED ORDER — BACLOFEN 10 MG PO TABS: 10.0000 mg | ORAL_TABLET | Freq: Two times a day (BID) | ORAL | 0 refills | Status: DC

## 2020-10-22 MED ORDER — MAGNESIUM OXIDE -MG SUPPLEMENT 400 (240 MG) MG PO TABS: 400.0000 mg | ORAL_TABLET | Freq: Two times a day (BID) | ORAL | 0 refills | Status: DC

## 2020-10-22 MED ORDER — ATORVASTATIN CALCIUM 10 MG PO TABS: 10.0000 mg | ORAL_TABLET | Freq: Every day | ORAL | 0 refills | Status: AC

## 2020-10-22 MED ORDER — LEVETIRACETAM 750 MG PO TABS: 750.0000 mg | ORAL_TABLET | Freq: Two times a day (BID) | ORAL | 0 refills | Status: DC

## 2020-10-22 MED ORDER — DOCUSATE SODIUM 100 MG PO CAPS: 100.0000 mg | ORAL_CAPSULE | Freq: Two times a day (BID) | ORAL | 0 refills | Status: DC

## 2020-10-22 NOTE — Progress Notes (Signed)
PROGRESS NOTE   Subjective/Complaints: Pt up with therapy early. No new problems today  ROS: Patient denies fever, rash, sore throat, blurred vision, nausea, vomiting, diarrhea, cough, shortness of breath or chest pain, joint or back pain, headache, or mood change.   Objective:   No results found. Recent Labs    10/20/20 0440  WBC 7.1  HGB 12.6*  HCT 38.4*  PLT 266     Recent Labs    10/20/20 0440  NA 136  K 3.8  CL 100  CO2 27  GLUCOSE 92  BUN 11  CREATININE 1.09  CALCIUM 8.8*       Intake/Output Summary (Last 24 hours) at 10/22/2020 0808 Last data filed at 10/22/2020 0747 Gross per 24 hour  Intake 830 ml  Output --  Net 830 ml        Physical Exam: Vital Signs Blood pressure 106/68, pulse 61, temperature 98.3 F (36.8 C), temperature source Oral, resp. rate 18, height $RemoveBe'5\' 11"'TerLAhYbA$  (1.803 m), weight 95.1 kg, SpO2 96 %. Constitutional: No distress . Vital signs reviewed. HEENT: EOMI, oral membranes moist Neck: supple Cardiovascular: RRR without murmur. No JVD    Respiratory/Chest: CTA Bilaterally without wheezes or rales. Normal effort    GI/Abdomen: BS +, non-tender, non-distended Ext: no clubbing, cyanosis, or edema Psych: pleasant and cooperative  Skin: surgical wound CDI, pulled off a piece of scab today Neuro: Pt is cognitively appropriate with normal insight, memory, and awareness. Cranial nerves 2-12 are intact. LLE with sl diminished LT. LUE touch only minimally diminished..   No tremors. Motor function is grossly 5/5 on right. LUE 4 to 4+/5. LLE 1/5  HF, 2/5 HE,  HAD,HAB, 2+/5 KE, and remains 0/5 at  ankle.  No motor changes.  DTR's 3+ LLE Musculoskeletal: Full ROM, No pain with AROM or PROM in the neck, trunk, or extremities. Posture appropriate . Left heel cord tightness improving   Assessment/Plan: 1. Functional deficits which require 3+ hours per day of interdisciplinary therapy in a  comprehensive inpatient rehab setting. Physiatrist is providing close team supervision and 24 hour management of active medical problems listed below. Physiatrist and rehab team continue to assess barriers to discharge/monitor patient progress toward functional and medical goals  Care Tool:  Bathing    Body parts bathed by patient: Right arm, Left arm, Chest, Abdomen, Front perineal area, Buttocks, Right upper leg, Left upper leg, Right lower leg, Left lower leg, Face   Body parts bathed by helper: Left arm     Bathing assist Assist Level: Supervision/Verbal cueing     Upper Body Dressing/Undressing Upper body dressing   What is the patient wearing?: Pull over shirt    Upper body assist Assist Level: Independent    Lower Body Dressing/Undressing Lower body dressing      What is the patient wearing?: Pants     Lower body assist Assist for lower body dressing: Supervision/Verbal cueing     Toileting Toileting    Toileting assist Assist for toileting: Supervision/Verbal cueing     Transfers Chair/bed transfer  Transfers assist     Chair/bed transfer assist level: Supervision/Verbal cueing     Locomotion Ambulation   Ambulation  assist      Assist level: Moderate Assistance - Patient 50 - 74% Assistive device: Walker-rolling Max distance: 150'   Walk 10 feet activity   Assist     Assist level: Moderate Assistance - Patient - 50 - 74% Assistive device: Walker-rolling   Walk 50 feet activity   Assist Walk 50 feet with 2 turns activity did not occur: Safety/medical concerns  Assist level: Moderate Assistance - Patient - 50 - 74% Assistive device: Walker-rolling    Walk 150 feet activity   Assist Walk 150 feet activity did not occur: Safety/medical concerns  Assist level: Moderate Assistance - Patient - 50 - 74% Assistive device: Walker-rolling    Walk 10 feet on uneven surface  activity   Assist Walk 10 feet on uneven surfaces activity  did not occur: Safety/medical concerns   Assist level: Independent with assistive device Assistive device:  [facility])   Wheelchair     Assist Will patient use wheelchair at discharge?: Yes Type of Wheelchair: Manual    Wheelchair assist level: Independent Max wheelchair distance: >150 ft    Wheelchair 50 feet with 2 turns activity    Assist        Assist Level: Independent   Wheelchair 150 feet activity     Assist      Assist Level: Independent   Blood pressure 106/68, pulse 61, temperature 98.3 F (36.8 C), temperature source Oral, resp. rate 18, height $RemoveBe'5\' 11"'tGHmnKdFV$  (1.803 m), weight 95.1 kg, SpO2 96 %.  Medical Problem List and Plan: 1.  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 Goals met!  -discussed follow up with pt  -[facility], NS as outpt 2.  Antithrombotics: -Dc lovenox at discharge 3. Pain Management: Tylenol as needed     Magnesium 40 mg BID for muscle spasms-      baclofen bid  for LLE spasms     4. Mood/hx of depression: f/u as outpt, in better place currently -continue lexapro 5.   Left focal motor seizure w/aura: no seizures while on rehab --continue Keppra 750 mg twice daily.       LOS: 21 days A FACE TO FACE EVALUATION WAS PERFORMED  Meredith Staggers 10/22/2020, 8:08 AM

## 2020-10-22 NOTE — Progress Notes (Signed)
Inpatient Rehabilitation Care Coordinator Discharge Note  The overall goal for the admission was met for:   Discharge location: D/c to home with 24/7 care from sister and dtr.   Length of Stay: 20 days.   Discharge activity level: Supervision.   Home/community participation: Limited.   Services provided included: MD, RD, PT, OT, SLP, RN, CM, TR, Pharmacy, Neuropsych, and SW  Financial Services: Private Insurance: [employer]  Choices offered to/list presented to:Yes  Follow-up services arranged: Home Health: [facility] for HHPT/OT and DME: [place] for 3in1 BSC, TTB, w/c  Comments (or additional information):  Patient/Family verbalized understanding of follow-up arrangements: Yes  Individual responsible for coordination of the follow-up plan: contact pt sister Hassan Rowan #[phone]  Confirmed correct DME delivered: Rana Snare 10/22/2020    Rana Snare

## 2020-10-22 NOTE — Progress Notes (Signed)
Pt has all personal belongings and equipment for discharge, family at bedside, PA notified.

## 2020-10-22 NOTE — Discharge Summary (Signed)
Physician Discharge Summary  Patient ID: Brent Harrington MRN: [id number] DOB/AGE: 70-22-1952 70 y.o.  Admit date: 10/01/2020 Discharge date: 10/22/2020  Discharge Diagnoses:  Principal Problem:   S/P resection of meningioma Active Problems:   Meningioma, cerebral ([place])   Focal seizures ([place])   Discharged Condition: stable  Significant Diagnostic Studies: No results found.  Labs:  Basic Metabolic Panel: BMP Latest Ref Rng & Units 10/20/2020 10/13/2020 10/10/2020  Glucose 70 - 99 mg/dL 92 93 98  BUN 8 - 23 mg/dL '11 20 18  '$ Creatinine 0.61 - 1.24 mg/dL 1.09 1.21 1.22  Sodium 135 - 145 mmol/L 136 135 136  Potassium 3.5 - 5.1 mmol/L 3.8 3.9 4.2  Chloride 98 - 111 mmol/L 100 101 101  CO2 22 - 32 mmol/L '27 27 27  '$ Calcium 8.9 - 10.3 mg/dL 8.8(L) 8.7(L) 9.1     CBC: CBC Latest Ref Rng & Units 10/20/2020 10/13/2020 10/06/2020  WBC 4.0 - 10.5 K/uL 7.1 7.2 15.7(H)  Hemoglobin 13.0 - 17.0 g/dL 12.6(L) 13.0 15.1  Hematocrit 39.0 - 52.0 % 38.4(L) 39.6 45.7  Platelets 150 - 400 K/uL 266 179 232      CBG: No results for input(s): GLUCAP in the last 168 hours.  Brief HPI:   Brent Harrington is a 70 y.o. male with history of colon cancer, anxiety disorder as well as left focal motor seizures for past 2 to 3 years was found to have 4.5 cm falcine meningioma with vasogenic edema in April of this year.  He was admitted on 09/24/2020 for right craniotomy with resection of meningioma by Dr. Marcello Moores.  Postop noted to have left upper greater than left lower extremity weakness with numbness.  Follow up MRI brain showed gross total resection of meningioma with persistent edema in right hemisphere and small perioperative infarct midportion of corpus callosum as well as 1 cm IPH right frontoparietal vertex with mild edema.  He was started on Decadron for edema control and had improvement in LUE strength.  He continued to be limited by left-sided weakness with impulsivity and poor safety awareness.  CIR was  recommended due to functional decline.   Hospital Course: Brent Harrington was admitted to rehab 10/01/2020 for inpatient therapies to consist of PT, ST and OT at least three hours five days a week. Past admission physiatrist, therapy team and rehab RN have worked together to provide customized collaborative inpatient rehab.  Follow-up labs done showed electrolytes within normal limits. He completed decadron taper on 07/14. Admission CBC showed leukocytosis with white count at 20.5 due to steroids and without signs of infection.  Serial check of CBC showed that leukocytosis is resolved and drop in H&H noted to 12.6 without signs of bleeding.  Recommend repeat CBC in 1 to 2 weeks to monitor for stability.  Blood pressures were monitored on TID basis and has been stable.  He was maintained on Keppra 750 mg p.o. twice daily and has been seizure-free on rehab.  Team has provided ego support during his stay and mood and outlook have improved.  He continues on home dose Lexapro and is tolerating this without side effects.  He has had issues with muscle spasms affecting LLE.  Magnesium and baclofen was added to help manage symptoms.  Cranial incision is healing well without any signs or symptoms of infection and staples were DC'd without difficulty. He has made steady progress during his stay. CGA is recommended for safety due to left inattention and for safety/fall prevention. He will continue to receive  follow up [place], [place] and HHST by [facility] after discharge.    Rehab course: During patient's stay in rehab weekly team conferences were held to monitor patient's progress, set goals and discuss barriers to discharge. At admission, patient required mod assist with mobility and max assist with ADL tasks. He demonstated moderate cognitive impairements with SLUMS score 25/30. He  has had improvement in activity tolerance, balance, postural control as well as ability to compensate for deficits. He has had  improvement in functional use LUE  and LLE as well as improvement in awareness. He requires supervision with CGA for ADL tasks.  He is able to complete midlly complex tasks at modified independent to supervision level. He requires CGA for transfers and moderate assist to ambulate 79' with RW.  He is able to complete cognitive tasks at modified independent to supervision level.     Disposition: Home  Diet: Regular.   Special Instructions: No driving till cleared by neurology.  Repeat CBC in a couple of weeks to monitor H/H   Discharge Instructions     Ambulatory referral to Physical Medicine Rehab   Complete by: As directed    3-4 week follow up appt      Allergies as of 10/22/2020   No Known Allergies      Medication List     TAKE these medications    aspirin EC 81 MG tablet Take 81 mg by mouth daily. Swallow whole. Notes to patient: Purchase over the counter   atorvastatin 10 MG tablet Commonly known as: LIPITOR Take 1 tablet (10 mg total) by mouth daily.   AYR SALINE NASAL RINSE NA Place 1 application into the nose daily.   baclofen 10 MG tablet Commonly known as: LIORESAL Take 1 tablet (10 mg total) by mouth 2 (two) times daily.   docusate sodium 100 MG capsule Commonly known as: COLACE Take 1 capsule (100 mg total) by mouth 2 (two) times daily. Notes to patient: NOTE THAT LAXATIVES ARE OVER THE COUNTER AND INSURANCE DOES NOT USUALLY COVER IT. YOU WILL HAVE TO PURCHASE THEM OVER THE COUNTER TO CONTINUE YOUR BOWEL REGIM   escitalopram 10 MG tablet Commonly known as: LEXAPRO Take 10 mg by mouth daily.   levETIRAcetam 750 MG tablet Commonly known as: KEPPRA Take 1 tablet (750 mg total) by mouth 2 (two) times daily.   magnesium oxide 400 (240 Mg) MG tablet Commonly known as: MAG-OX Take 1 tablet (400 mg total) by mouth 2 (two) times daily.        Follow-up Information     Meredith Staggers, MD .   Specialty: Physical Medicine and Rehabilitation Why:  office will call you with follow up appointment Contact information: [address] [place] [address] [phone]         [facility]. Call in 1 day(s).   Why: for post hospital follow up Contact information: [address] [place]  [address] [phone]         Vallarie Mare, MD. Call in 1 day(s).   Specialty: Neurosurgery Why: for post op appointment Contact information: [address] [place]  [address] [phone]                 Signed: Bary Leriche 10/28/2020, 11:37 PM

## 2020-10-27 ENCOUNTER — Inpatient Hospital Stay: Payer: Medicare HMO | Attending: Internal Medicine | Admitting: Internal Medicine

## 2020-10-27 ENCOUNTER — Other Ambulatory Visit: Payer: Self-pay

## 2020-10-27 VITALS — BP 117/91 | HR 72 | Temp 97.7°F | Resp 18

## 2020-10-27 DIAGNOSIS — Z8249 Family history of ischemic heart disease and other diseases of the circulatory system: Secondary | ICD-10-CM | POA: Insufficient documentation

## 2020-10-27 DIAGNOSIS — R531 Weakness: Secondary | ICD-10-CM | POA: Diagnosis not present

## 2020-10-27 DIAGNOSIS — G936 Cerebral edema: Secondary | ICD-10-CM | POA: Diagnosis not present

## 2020-10-27 DIAGNOSIS — Z833 Family history of diabetes mellitus: Secondary | ICD-10-CM | POA: Insufficient documentation

## 2020-10-27 DIAGNOSIS — Z79899 Other long term (current) drug therapy: Secondary | ICD-10-CM | POA: Insufficient documentation

## 2020-10-27 DIAGNOSIS — D320 Benign neoplasm of cerebral meninges: Secondary | ICD-10-CM | POA: Insufficient documentation

## 2020-10-27 DIAGNOSIS — R569 Unspecified convulsions: Secondary | ICD-10-CM | POA: Insufficient documentation

## 2020-10-27 DIAGNOSIS — Z85038 Personal history of other malignant neoplasm of large intestine: Secondary | ICD-10-CM | POA: Insufficient documentation

## 2020-10-27 DIAGNOSIS — R251 Tremor, unspecified: Secondary | ICD-10-CM | POA: Insufficient documentation

## 2020-10-27 NOTE — Progress Notes (Signed)
[facility] [place], [place] [address] [phone]   New Patient Evaluation  Date of Service: 10/27/20 Patient Name: Brent Harrington Patient MRN: [id number] Patient DOB: December 01, 1950 Provider: Ventura Sellers, MD  Identifying Statement:  Brent Harrington is a 70 y.o. male with right parietal meningioma who presents for initial consultation and evaluation.    Referring Provider: [facility],  [place] [address]  Oncologic History 09/24/20: R parafalcine meningioma resected by Dr. Marcello Moores  Interval History: The patient's records from the referring physician were obtained and reviewed and the patient interviewed to confirm this HPI.  Brent Harrington presented to medical attention in April 2022 with first ever seizure, characterized as "left leg shaking for several minutes, followed by weakness".  These events would recur every 2-3 weeks.  CNS imaging demonstrated a right fronto-parietal meningioma, which grew in size on follow up MRI.  This was resected by Dr. Marcello Moores on 09/24/20.  Following surgery, he developed significant weakness of his left arm and leg.  Currently (1 month post-op) his arm is back to full strength, but the leg is still very weak.  He is relying on a wheelchair currently for all transport, even around the home.  Modest improvement only through physical therapy.  Medications: Current Outpatient Medications on File Prior to Visit  Medication Sig Dispense Refill   aspirin EC 81 MG tablet Take 81 mg by mouth daily. Swallow whole.     atorvastatin (LIPITOR) 10 MG tablet Take 1 tablet (10 mg total) by mouth daily. 30 tablet 0   baclofen (LIORESAL) 10 MG tablet Take 1 tablet (10 mg total) by mouth 2 (two) times daily. 60 each 0   docusate sodium (COLACE) 100 MG capsule Take 1 capsule (100 mg total) by mouth 2 (two) times daily. 60 capsule 0   escitalopram (LEXAPRO) 10 MG tablet Take 10  mg by mouth daily.     levETIRAcetam (KEPPRA) 750 MG tablet Take 1 tablet (750 mg total) by mouth 2 (two) times daily. 60 tablet 0   magnesium oxide (MAG-OX) 400 (240 Mg) MG tablet Take 1 tablet (400 mg total) by mouth 2 (two) times daily. 60 tablet 0   Sodium Chloride-Sodium Bicarb (AYR SALINE NASAL RINSE NA) Place 1 application into the nose daily.     No current facility-administered medications on file prior to visit.    Allergies: No Known Allergies Past Medical History:  Past Medical History:  Diagnosis Date   Anxiety    Cancer ([place]) 2012   Colon Cancer   Past Surgical History:  Past Surgical History:  Procedure Laterality Date   APPLICATION OF CRANIAL NAVIGATION Right 09/24/2020   Procedure: APPLICATION OF CRANIAL NAVIGATION;  Surgeon: Vallarie Mare, MD;  Location: [facility];  Service: Neurosurgery;  Laterality: Right;   COLON SURGERY  2012   CRANIOTOMY Right 09/24/2020   Procedure: RIGHT CRANIOTOMY FOR RESECTION OF FALCINE MENINGIOMA;  Surgeon: Vallarie Mare, MD;  Location: [facility];  Service: Neurosurgery;  Laterality: Right;   Social History:  Social History   Socioeconomic History   Marital status: Married    Spouse name: Not on file   Number of children: Not on file   Years of education: Not on file   Highest education level: Not on file  Occupational History    Comment: Horticulturist, commercial, [place], [place]  Tobacco Use   Smoking status: Never   Smokeless tobacco:  Never  Vaping Use   Vaping Use: Never used  Substance and Sexual Activity   Alcohol use: Not Currently   Drug use: Never   Sexual activity: Not Currently  Other Topics Concern   Not on file  Social History Narrative   Not on file   Social Determinants of Health   Financial Resource Strain: Not on file  Food Insecurity: Not on file  Transportation Needs: Not on file  Physical Activity: Not on file  Stress: Not on file  Social Connections: Not on file  Intimate Partner Violence: Not on  file   Family History:  Family History  Problem Relation Age of Onset   Hypertension Mother    Heart disease Father    Diabetes Sister     Review of Systems: Constitutional: Doesn't report fevers, chills or abnormal weight loss Eyes: Doesn't report blurriness of vision Ears, nose, mouth, throat, and face: Doesn't report sore throat Respiratory: Doesn't report cough, dyspnea or wheezes Cardiovascular: Doesn't report palpitation, chest discomfort  Gastrointestinal:  Doesn't report nausea, constipation, diarrhea GU: Doesn't report incontinence Skin: Doesn't report skin rashes Neurological: Per HPI Musculoskeletal: Doesn't report joint pain Behavioral/Psych: Doesn't report anxiety  Physical Exam: Vitals:   10/27/20 1103  BP: (!) 117/91  Pulse: 72  Resp: 18  Temp: 97.7 F (36.5 C)  SpO2: 99%   KPS: 60. General: Alert, cooperative, pleasant, in no acute distress Head: Normal EENT: No conjunctival injection or scleral icterus.  Lungs: Resp effort normal Cardiac: Regular rate Abdomen: Non-distended abdomen Skin: No rashes cyanosis or petechiae. Extremities: No clubbing or edema  Neurologic Exam: Mental Status: Awake, alert, attentive to examiner. Oriented to self and environment. Language is fluent with intact comprehension.  Cranial Nerves: Visual acuity is grossly normal. Visual fields are full. Extra-ocular movements intact. No ptosis. Face is symmetric Motor: Tone and bulk are normal. Left leg 2/5, left arm 4+/5. Reflexes are symmetric, no pathologic reflexes present.  Sensory: Intact to light touch Gait: Wheelchair dependent   Labs: I have reviewed the data as listed    Component Value Date/Time   NA 136 10/20/2020 0440   K 3.8 10/20/2020 0440   CL 100 10/20/2020 0440   CO2 27 10/20/2020 0440   GLUCOSE 92 10/20/2020 0440   BUN 11 10/20/2020 0440   CREATININE 1.09 10/20/2020 0440   CALCIUM 8.8 (L) 10/20/2020 0440   PROT 5.5 (L) 10/02/2020 0457   ALBUMIN 2.9  (L) 10/02/2020 0457   AST 22 10/02/2020 0457   ALT 44 10/02/2020 0457   ALKPHOS 77 10/02/2020 0457   BILITOT 0.9 10/02/2020 0457   GFRNONAA >60 10/20/2020 0440   Lab Results  Component Value Date   WBC 7.1 10/20/2020   NEUTROABS 12.9 (H) 10/02/2020   HGB 12.6 (L) 10/20/2020   HCT 38.4 (L) 10/20/2020   MCV 87.1 10/20/2020   PLT 266 10/20/2020    Imaging: CLINICAL DATA:  Follow-up resection of right para falcine mass.   EXAM: MRI HEAD WITHOUT AND WITH CONTRAST   TECHNIQUE: Multiplanar, multiecho pulse sequences of the brain and surrounding structures were obtained without and with intravenous contrast.   CONTRAST:  9.20m GADAVIST GADOBUTROL 1 MMOL/ML IV SOLN   COMPARISON:  09/12/2020   FINDINGS: Brain: Right frontoparietal vertex craniotomy has been performed. There appears to be gross total resection of the previously seen meningioma in the right para falcine region. Postoperative space contains some fluid and layering blood material. Some non dependent intracranial air as often seen in the  immediate postoperative period. Similar pattern of edema persisting at this time within the right hemisphere. 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 Possible 1 or 2 punctate perioperative infarctions within the adjacent white matter. There is a 1 cm intraparenchymal hemorrhage within the right frontoparietal vertex with mild surrounding edema. No hydrocephalus. No extra-axial fluid collection   Vascular: Major vessels at the base of the brain show flow.   Skull and upper cervical spine: Otherwise negative   Sinuses/Orbits: Inflammatory changes throughout the paranasal sinuses, similar to the preoperative exam.   Other: None   IMPRESSION: Gross total resection of the right para falcine meningioma. Small amount of fluid and blood in the postoperative space.   Persistent edema of the right hemisphere, similar to  the preoperative study.   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 Possible 1 or 2 punctate perioperative infarctions in the white matter on the right. 1 cm intraparenchymal hemorrhage at the right frontoparietal vertex with mild surrounding edema   Intracranial air as often seen.     Electronically Signed   By: Nelson Chimes M.D.   On: 09/25/2020 20:14   Pathology: SURGICAL PATHOLOGY  CASE: [id number]  PATIENT: Carolyn Stare  Surgical Pathology Report   Clinical History: meningioma (cm)   FINAL MICROSCOPIC DIAGNOSIS:   A. BRAIN, FALCINE MASS, BIOPSY:  - Meningioma, WHO grade I.   B. BRAIN, FALCINE MASS, RESECTION:  - Meningioma, WHO grade I.   COMMENT:   A. and B. Dr. Saralyn Pilar has reviewed the case.   INTRAOPERATIVE DIAGNOSIS:   A1. BRAIN, FALCINE MASS, FROZEN SECTION:         Meningioma.         Rapid intraoperative consult diagnosis rendered by Dr. Saralyn Pilar @  1244 09/24/2020.    Assessment/Plan Meningioma, cerebral ([place])  Focal seizures ([place])  We appreciate the opportunity to participate in the care of Carolyn Stare.  He is clinically stable today, now 1 month s/p craniotomy for parafalcine meningioma WHO grade I.   Seizures are well controlled on Keppra '750mg'$  BID.  Left sided weakness is still dense in the leg, but has improved modestly through physical therapy.    There could be small remnant of tumor post-operatively based on op report and MRI review.  We will repeat MRI brain in 5 months to assess for any tumorigenic changes.  Screening for potential clinical trials was performed and discussed using eligibility criteria for active protocols at [facility], loco-regional tertiary centers, as well as national database available on [email or web address].com.    The patient is not a candidate for a research protocol at this time due to no suitable study identified.   We spent twenty additional minutes teaching regarding the  natural history, biology, and historical experience in the treatment of brain tumors. We then discussed in detail the current recommendations for therapy focusing on the mode of administration, mechanism of action, anticipated toxicities, and quality of life issues associated with this plan. We also provided teaching sheets for the patient to take home as an additional resource.  We ask that Tommylee Newitt return to clinic in  5 months following next brain MRI, or sooner as needed.  All questions were answered. The patient knows to call the clinic with any problems, questions or concerns. No barriers to learning were detected.  The total time spent in the encounter was 45 minutes and more than 50% was on counseling and review of test results  Ventura Sellers, MD Medical Director of Neuro-Oncology [facility] 10/27/20 10:59 AM

## 2020-10-28 ENCOUNTER — Telehealth: Payer: Self-pay | Admitting: *Deleted

## 2020-10-28 NOTE — Telephone Encounter (Signed)
Aaron Edelman OT [facility] called for  POC 2wk3.  Approval given.

## 2020-11-10 ENCOUNTER — Telehealth: Payer: Self-pay

## 2020-11-10 MED ORDER — BACLOFEN 10 MG PO TABS: 10.0000 mg | ORAL_TABLET | Freq: Two times a day (BID) | ORAL | 0 refills | Status: DC

## 2020-11-10 NOTE — Telephone Encounter (Signed)
Refill Baclofen

## 2020-11-13 ENCOUNTER — Other Ambulatory Visit: Payer: Self-pay | Admitting: Physical Medicine and Rehabilitation

## 2020-11-27 ENCOUNTER — Telehealth: Payer: Self-pay

## 2020-11-27 NOTE — Telephone Encounter (Signed)
Doren Custard, PT from [facility] called requesting verbal orders of HHPT 2wk4. Orders approved and given.

## 2020-12-05 ENCOUNTER — Telehealth: Payer: Self-pay

## 2020-12-05 ENCOUNTER — Telehealth: Payer: Self-pay | Admitting: *Deleted

## 2020-12-05 DIAGNOSIS — Z9889 Other specified postprocedural states: Secondary | ICD-10-CM

## 2020-12-05 DIAGNOSIS — D320 Benign neoplasm of cerebral meninges: Secondary | ICD-10-CM

## 2020-12-05 DIAGNOSIS — Z86018 Personal history of other benign neoplasm: Secondary | ICD-10-CM

## 2020-12-05 NOTE — Telephone Encounter (Signed)
Patient called stating he was discharged from [facility] therapies and wanted to go to outpatient. Marcella Dubs, PT called and asked for verbal orders the day before. I called Doren Custard and get clarification and he said yes OT discharged and stated he can go to outpatient for PT. Patient prefers [facility] in [place]. Will put the referral in.

## 2020-12-05 NOTE — Telephone Encounter (Signed)
Brent Harrington called asking about his physical therapy.  He has been discharged from [facility] so we will refer to outpt neuro rehab. I have let Brent Harrington know.

## 2020-12-10 ENCOUNTER — Encounter: Payer: Self-pay | Admitting: Physical Medicine & Rehabilitation

## 2020-12-10 ENCOUNTER — Encounter: Payer: Medicare HMO | Attending: Physical Medicine & Rehabilitation | Admitting: Physical Medicine & Rehabilitation

## 2020-12-10 ENCOUNTER — Other Ambulatory Visit: Payer: Self-pay

## 2020-12-10 ENCOUNTER — Other Ambulatory Visit: Payer: Self-pay | Admitting: Physical Medicine & Rehabilitation

## 2020-12-10 VITALS — BP 121/85 | HR 76 | Temp 98.1°F | Ht 71.0 in | Wt 206.0 lb

## 2020-12-10 DIAGNOSIS — R569 Unspecified convulsions: Secondary | ICD-10-CM

## 2020-12-10 DIAGNOSIS — G8194 Hemiplegia, unspecified affecting left nondominant side: Secondary | ICD-10-CM | POA: Diagnosis present

## 2020-12-10 DIAGNOSIS — D320 Benign neoplasm of cerebral meninges: Secondary | ICD-10-CM

## 2020-12-10 NOTE — Patient Instructions (Signed)
Left side and this 1 day and 2023 years today after 911 I was

## 2020-12-10 NOTE — Progress Notes (Deleted)
   Subjective:    Patient ID: Brent Harrington, male    DOB: January 07, 1951, 70 y.o.   MRN: [id number]  HPI   .cpr Review of Systems     Objective:   Physical Exam        Assessment & Plan:

## 2020-12-10 NOTE — Progress Notes (Signed)
Subjective:    Patient ID: Brent Harrington, male    DOB: [birth year], 70 y.o.   MRN: [id number]  HPI Mr. Decaire is here in follow up of his right frontal meningioma.  He has been home since early August.  He received home health therapies until a couple weeks ago.  He was trying to get some more therapies but we ran into some roadblocks apparently.  He has been walking at home with his rolling walker.  He does not have an AFO yet.  He did have a fall this past Saturday when he went to take a step on the left leg without his walker thinking that he was back to his old self and the leg gave way.  He suffered some bruising and abrasions to the left arm and leg but fortunately avoided any further injury.  He does still have some spasms in the left leg but the magnesium and baclofen that helped to a certain extent.  He asked if he still needs to take the baclofen.  Mood has been improved in general.  He reports no further seizure activities since leaving rehab.  He had no seizures while in rehab either.  He remains on Keppra twice daily.  Bowel and bladder function is generally within normal limits.  He stopped stool softener as his stools are of normal consistency.  Pain Inventory Average Pain 0 Pain Right Now 0 My pain is  No pain , Decreased function in [place] Leg  LOCATION OF PAIN  No pain  BOWEL Number of stools per week: 7 Oral laxative use No   BLADDER Normal  Mobility walk without assistance use a walker how many minutes can you walk? 15 ability to climb steps?  yes use a wheelchair Do you have any goals in this area?  yes  Function retired I need assistance with the following:  shopping Do you have any goals in this area?  yes  Neuro/Psych weakness trouble walking  Prior Studies Any changes since last visit?  no  Physicians involved in your care Any changes since last visit?  no   Family History  Problem Relation Age of Onset   Hypertension Mother    Heart disease  Father    Diabetes Sister    Social History   Socioeconomic History   Marital status: Married    Spouse name: Not on file   Number of children: Not on file   Years of education: Not on file   Highest education level: Not on file  Occupational History    Comment: Horticulturist, commercial, [place], [place]  Tobacco Use   Smoking status: Never   Smokeless tobacco: Never  Vaping Use   Vaping Use: Never used  Substance and Sexual Activity   Alcohol use: Not Currently   Drug use: Never   Sexual activity: Not Currently  Other Topics Concern   Not on file  Social History Narrative   Not on file   Social Determinants of Health   Financial Resource Strain: Not on file  Food Insecurity: Not on file  Transportation Needs: Not on file  Physical Activity: Not on file  Stress: Not on file  Social Connections: Not on file   Past Surgical History:  Procedure Laterality Date   APPLICATION OF CRANIAL NAVIGATION Right 09/24/2020   Procedure: APPLICATION OF CRANIAL NAVIGATION;  Surgeon: Vallarie Mare, MD;  Location: [facility];  Service: Neurosurgery;  Laterality: Right;   COLON SURGERY  2012   CRANIOTOMY Right  09/24/2020   Procedure: RIGHT CRANIOTOMY FOR RESECTION OF FALCINE MENINGIOMA;  Surgeon: Vallarie Mare, MD;  Location: [facility];  Service: Neurosurgery;  Laterality: Right;   Past Medical History:  Diagnosis Date   Anxiety    Cancer ([place]) 2012   Colon Cancer   BP 121/85   Pulse 76   Temp 98.1 F (36.7 C)   Ht 5\' 11"  (1.803 m)   Wt 206 lb (93.4 kg)   SpO2 96%   BMI 28.73 kg/m   Opioid Risk Score:   Fall Risk Score:  `1  Depression screen PHQ 2/9  Depression screen PHQ 2/9 12/10/2020  Decreased Interest 0  Down, Depressed, Hopeless 0  PHQ - 2 Score 0  Altered sleeping 0  Tired, decreased energy 0  Change in appetite 0  Feeling bad or failure about yourself  0  Trouble concentrating 0  Moving slowly or fidgety/restless 0  Suicidal thoughts 0  PHQ-9 Score 0     Review of Systems  Musculoskeletal:  Positive for gait problem.       Left leg weakness  All other systems reviewed and are negative.     Objective:   Physical Exam  Gen: no distress, normal appearing HEENT: oral mucosa pink and moist, NCAT Cardio: Reg rate Chest: normal effort, normal rate of breathing Abd: soft, non-distended Ext: no edema Psych: pleasant, normal affect Skin: intact Neuro: Patient is alert and oriented x3.  Cranial nerve exam is nonfocal.  Upper extremity strength is grossly 5-5 in the right and 4+ out of 5 on the left.  Right lower extremity is 5 out of 5.  Left lower extremity is 2+ to 3 out of 5 in hip flexors and knee extensors 2 out of 5 knee flexors trace plantarflexion and 0 out of 5 ankle dorsiflexion.  Sensory exams grossly intact in all 4 limbs.  No focal tone is appreciated.  Is hyperreflexic in the left lower extremity.  He walks with a steppage gait pattern in the left lower extremity but does fairly well with his balance.  Right leg is externally rotated. Musculoskeletal: No pain with range of motion.  Posture is fair to good.   Medical Problem List and Plan: 1.  decreased mobility and ADLs secondary to RIght frontal meningioma, resection 09/24/20              -Transition to outpatient therapies at [facility] physical therapy in [place].  -Made referral to [facility] in [place] for left AFO. 2.  Spasticity:                Magnesium 40 mg BID for muscle spasms-                 baclofen nightly as needed.  If he wants to stop as he may.  He can observe for effect                           4. Mood/hx of depression: f/u as outpt, in better place currently -Recommend continuing Lexapro 5.   Left focal motor seizure w/aura: no seizures while on rehab -- Should continue Keppra 750 mg twice daily.   Thirty minutes of face to face patient care time were spent during this visit. All questions were encouraged and answered. Follow up with me in 2 mos.

## 2021-01-30 ENCOUNTER — Other Ambulatory Visit: Payer: Self-pay | Admitting: Neurosurgery

## 2021-01-30 ENCOUNTER — Other Ambulatory Visit (HOSPITAL_COMMUNITY): Payer: Self-pay | Admitting: Neurosurgery

## 2021-01-30 DIAGNOSIS — D320 Benign neoplasm of cerebral meninges: Secondary | ICD-10-CM

## 2021-02-11 ENCOUNTER — Other Ambulatory Visit: Payer: Self-pay

## 2021-02-11 ENCOUNTER — Encounter: Payer: Medicare HMO | Attending: Physical Medicine & Rehabilitation | Admitting: Physical Medicine & Rehabilitation

## 2021-02-11 ENCOUNTER — Encounter: Payer: Self-pay | Admitting: Physical Medicine & Rehabilitation

## 2021-02-11 VITALS — BP 133/89 | HR 78 | Temp 98.2°F | Ht 71.0 in | Wt 210.0 lb

## 2021-02-11 DIAGNOSIS — D320 Benign neoplasm of cerebral meninges: Secondary | ICD-10-CM

## 2021-02-11 DIAGNOSIS — G8194 Hemiplegia, unspecified affecting left nondominant side: Secondary | ICD-10-CM | POA: Diagnosis present

## 2021-02-11 NOTE — Progress Notes (Signed)
Subjective:    Patient ID: Brent Harrington, male    DOB: Apr 10, 1950, 70 y.o.   MRN: [id number]  HPI  Brent Harrington is here in follow up of his meningioma and resection with resulting left sided weakness. He got started in therapy [place]. His left leg and foot still are weak. He's using a straight cane for balance. He received his left AFO but isn't use it all the time anymore   His mood has been pretty positive. He's sleeping pretty well.   His spasms are generally improved.  He is no longer using baclofen or magnesium.  He is really having minimal pain in general at this point.   Pain Inventory Average Pain 0 Pain Right Now 0 My pain is  No pain  LOCATION OF PAIN  No pain  BOWEL Number of stools per week: 7  BLADDER Normal  Mobility use a cane how many minutes can you walk? unknown ability to climb steps?  yes do you drive?  yes Do you have any goals in this area?  yes  Function retired I need assistance with the following:  shopping  Neuro/Psych weakness tingling trouble walking spasms  Prior Studies Any changes since last visit?  no  Physicians involved in your care Any changes since last visit?  no   Family History  Problem Relation Age of Onset   Hypertension Mother    Heart disease Father    Diabetes Sister    Social History   Socioeconomic History   Marital status: Married    Spouse name: Not on file   Number of children: Not on file   Years of education: Not on file   Highest education level: Not on file  Occupational History    Comment: Horticulturist, commercial, [place], [place]  Tobacco Use   Smoking status: Never   Smokeless tobacco: Never  Vaping Use   Vaping Use: Never used  Substance and Sexual Activity   Alcohol use: Not Currently   Drug use: Never   Sexual activity: Not Currently  Other Topics Concern   Not on file  Social History Narrative   Not on file   Social Determinants of Health   Financial Resource Strain:  Not on file  Food Insecurity: Not on file  Transportation Needs: Not on file  Physical Activity: Not on file  Stress: Not on file  Social Connections: Not on file   Past Surgical History:  Procedure Laterality Date   APPLICATION OF CRANIAL NAVIGATION Right 09/24/2020   Procedure: APPLICATION OF CRANIAL NAVIGATION;  Surgeon: Vallarie Mare, MD;  Location: [facility];  Service: Neurosurgery;  Laterality: Right;   COLON SURGERY  2012   CRANIOTOMY Right 09/24/2020   Procedure: RIGHT CRANIOTOMY FOR RESECTION OF FALCINE MENINGIOMA;  Surgeon: Vallarie Mare, MD;  Location: [facility];  Service: Neurosurgery;  Laterality: Right;   Past Medical History:  Diagnosis Date   Anxiety    Cancer ([place]) 2012   Colon Cancer   BP 133/89   Pulse 78   Temp 98.2 F (36.8 C)   Ht 5\' 11"  (1.803 m)   Wt 210 lb (95.3 kg)   SpO2 95%   BMI 29.29 kg/m   Opioid Risk Score:   Fall Risk Score:  `1  Depression screen PHQ 2/9  Depression screen [facility] 2/9 02/11/2021 12/10/2020  Decreased Interest 0 0  Down, Depressed, Hopeless 0 0  PHQ - 2 Score 0 0  Altered sleeping - 0  Tired,  decreased energy - 0  Change in appetite - 0  Feeling bad or failure about yourself  - 0  Trouble concentrating - 0  Moving slowly or fidgety/restless - 0  Suicidal thoughts - 0  PHQ-9 Score - 0    Review of Systems  Musculoskeletal:  Positive for gait problem.       Left leg, left knee & ankle weakness  All other systems reviewed and are negative.     Objective:   Physical Exam  General: No acute distress HEENT: NCAT, EOMI, oral membranes moist Cards: reg rate  Chest: normal effort Abdomen: Soft, NT, ND Skin: dry, intact Extremities: no edema Psych: pleasant and appropriate  Skin: intact Neuro: Patient is alert and oriented x3.  Cranial nerve exam is nonfocal.  Upper extremity strength is grossly 5-5 in the right and 4+ out of 5 on the left.  Right lower extremity is 5 out of 5.  Left lower extremity is 5/5 in hip  flexors and knee extensors 5/5 knee flexors 1-2/5 plantarflexion and 1/5 ankle dorsiflexion.  Sensory exams grossly intact in all 4 limbs.  No focal tone is appreciated.  Still  hyperreflexic in the left lower extremity. Tends to externally rotate his right leg, sl steppage pattern and strikes with forefoot/slides foot to clear. Not too bad considering the weakness he has at ankle.  Musculoskeletal: No pain with range of motion.  Posture is fair to good.     Medical Problem List and Plan: 1.  decreased mobility and ADLs secondary to RIght frontal meningioma, resection 09/24/20   -HEP  -outpt therapy--transition to gym program, aquatics  -NS f/u as directed  -wear AFO when on uneven surface 2.  Spasticity:               improved                                            4. Mood/hx of depression:  improved -Recommend continuing Lexapro for your 5.   Left focal motor seizure w/aura: no seizures while on rehab -- Should continue Keppra 750 mg twice daily.   Fifteen minutes of face to face patient care time were spent during this visit. All questions were encouraged and answered.  Follow up with me prn.    Marland Kitchen

## 2021-02-11 NOTE — Patient Instructions (Addendum)
PLEASE FEEL FREE TO CALL OUR OFFICE WITH ANY PROBLEMS OR QUESTIONS ([phone])  WEAR YOUR ANKLE BRACE FOR UNEVEN SURFACES OR IF YOU'RE GONNA BE UP ON YOUR FEET FOR LONG PERIODS OF TIME.

## 2021-03-02 ENCOUNTER — Other Ambulatory Visit: Payer: Self-pay

## 2021-03-27 ENCOUNTER — Ambulatory Visit (HOSPITAL_COMMUNITY)
Admission: RE | Admit: 2021-03-27 | Discharge: 2021-03-27 | Disposition: A | Discharge status: Home / Self Care | Payer: Medicare HMO | Source: Ambulatory Visit | Attending: Neurosurgery | Admitting: Neurosurgery

## 2021-03-27 ENCOUNTER — Other Ambulatory Visit: Payer: Self-pay

## 2021-03-27 DIAGNOSIS — D320 Benign neoplasm of cerebral meninges: Secondary | ICD-10-CM | POA: Diagnosis present

## 2021-03-27 MED ORDER — GADOBUTROL 1 MMOL/ML IV SOLN
9.5000 mL | Freq: Once | INTRAVENOUS | Status: AC | PRN
  Administered 2021-03-27: 9.5 mL via INTRAVENOUS

## 2021-03-30 ENCOUNTER — Inpatient Hospital Stay: Payer: Medicare HMO | Attending: Internal Medicine | Admitting: Internal Medicine

## 2021-03-30 ENCOUNTER — Other Ambulatory Visit: Payer: Self-pay

## 2021-03-30 ENCOUNTER — Inpatient Hospital Stay: Payer: Medicare HMO

## 2021-03-30 VITALS — BP 125/86 | HR 84 | Temp 98.1°F | Resp 17 | Ht 71.0 in | Wt 210.1 lb

## 2021-03-30 DIAGNOSIS — Z833 Family history of diabetes mellitus: Secondary | ICD-10-CM | POA: Diagnosis not present

## 2021-03-30 DIAGNOSIS — Z85038 Personal history of other malignant neoplasm of large intestine: Secondary | ICD-10-CM | POA: Insufficient documentation

## 2021-03-30 DIAGNOSIS — Z79899 Other long term (current) drug therapy: Secondary | ICD-10-CM | POA: Insufficient documentation

## 2021-03-30 DIAGNOSIS — R531 Weakness: Secondary | ICD-10-CM | POA: Insufficient documentation

## 2021-03-30 DIAGNOSIS — R251 Tremor, unspecified: Secondary | ICD-10-CM | POA: Diagnosis not present

## 2021-03-30 DIAGNOSIS — R569 Unspecified convulsions: Secondary | ICD-10-CM

## 2021-03-30 DIAGNOSIS — Z8249 Family history of ischemic heart disease and other diseases of the circulatory system: Secondary | ICD-10-CM | POA: Insufficient documentation

## 2021-03-30 DIAGNOSIS — G9389 Other specified disorders of brain: Secondary | ICD-10-CM | POA: Diagnosis not present

## 2021-03-30 DIAGNOSIS — D320 Benign neoplasm of cerebral meninges: Secondary | ICD-10-CM | POA: Diagnosis present

## 2021-03-30 NOTE — Progress Notes (Signed)
[facility] at [place],  [id number] [phone]   Interval Evaluation  Date of Service: 03/30/21 Patient Name: Brent Harrington Patient MRN: [id number] Patient DOB: 19-Jun-1950 Provider: Ventura Sellers, MD  Identifying Statement:  Brent Harrington is a 71 y.o. male with right parietal meningioma   Oncologic History 09/24/20: R parafalcine meningioma resected by Dr. Marcello Moores  Interval History: Brent Harrington presents today for follow up after recent MRI brain.  He describes no new or progressive neurologic deficits.  Left leg weakness has improved somewhat, he now only requires a cane for ambulation.  The ankle is the weakest component at this time.  No significant problems with his left arm or hand.  Planning trip to [place] in the summer.   H+P (10/27/20) Patient presented to medical attention in April 2022 with first ever seizure, characterized as "left leg shaking for several minutes, followed by weakness".  These events would recur every 2-3 weeks.  CNS imaging demonstrated a right fronto-parietal meningioma, which grew in size on follow up MRI.  This was resected by Dr. Marcello Moores on 09/24/20.  Following surgery, he developed significant weakness of his left arm and leg.  Currently (1 month post-op) his arm is back to full strength, but the leg is still very weak.  He is relying on a wheelchair currently for all transport, even around the home.  Modest improvement only through physical therapy.  Medications: Current Outpatient Medications on File Prior to Visit  Medication Sig Dispense Refill   aspirin EC 81 MG tablet Take 81 mg by mouth daily. Swallow whole.     atorvastatin (LIPITOR) 10 MG tablet Take 1 tablet (10 mg total) by mouth daily. 30 tablet 0   escitalopram (LEXAPRO) 10 MG tablet Take 10 mg by mouth daily.     levETIRAcetam (KEPPRA) 500 MG tablet Take 500 mg by mouth 2 (two) times daily.     No current facility-administered  medications on file prior to visit.    Allergies: No Known Allergies Past Medical History:  Past Medical History:  Diagnosis Date   Anxiety    Cancer ([facility]) 2012   Colon Cancer   Past Surgical History:  Past Surgical History:  Procedure Laterality Date   APPLICATION OF CRANIAL NAVIGATION Right 09/24/2020   Procedure: APPLICATION OF CRANIAL NAVIGATION;  Surgeon: Vallarie Mare, MD;  Location: [facility];  Service: Neurosurgery;  Laterality: Right;   COLON SURGERY  2012   CRANIOTOMY Right 09/24/2020   Procedure: RIGHT CRANIOTOMY FOR RESECTION OF FALCINE MENINGIOMA;  Surgeon: Vallarie Mare, MD;  Location: [facility];  Service: Neurosurgery;  Laterality: Right;   Social History:  Social History   Socioeconomic History   Marital status: Married    Spouse name: Not on file   Number of children: Not on file   Years of education: Not on file   Highest education level: Not on file  Occupational History    Comment: Horticulturist, commercial, [place], [place]  Tobacco Use   Smoking status: Never   Smokeless tobacco: Never  Vaping Use   Vaping Use: Never used  Substance and Sexual Activity   Alcohol use: Not Currently   Drug use: Never   Sexual activity: Not Currently  Other Topics Concern   Not on file  Social History Narrative   Not on file   Social Determinants of Health   Financial Resource Strain: Not on file  Food Insecurity: Not on file  Transportation  Needs: Not on file  Physical Activity: Not on file  Stress: Not on file  Social Connections: Not on file  Intimate Partner Violence: Not on file   Family History:  Family History  Problem Relation Age of Onset   Hypertension Mother    Heart disease Father    Diabetes Sister     Review of Systems: Constitutional: Doesn't report fevers, chills or abnormal weight loss Eyes: Doesn't report blurriness of vision Ears, nose, mouth, throat, and face: Doesn't report sore throat Respiratory: Doesn't report cough, dyspnea or  wheezes Cardiovascular: Doesn't report palpitation, chest discomfort  Gastrointestinal:  Doesn't report nausea, constipation, diarrhea GU: Doesn't report incontinence Skin: Doesn't report skin rashes Neurological: Per HPI Musculoskeletal: Doesn't report joint pain Behavioral/Psych: Doesn't report anxiety  Physical Exam: Vitals:   03/30/21 1111  BP: 125/86  Pulse: 84  Resp: 17  Temp: 98.1 F (36.7 C)  SpO2: 96%   KPS: 70 General: Alert, cooperative, pleasant, in no acute distress Head: Normal EENT: No conjunctival injection or scleral icterus.  Lungs: Resp effort normal Cardiac: Regular rate Abdomen: Non-distended abdomen Skin: No rashes cyanosis or petechiae. Extremities: No clubbing or edema  Neurologic Exam: Mental Status: Awake, alert, attentive to examiner. Oriented to self and environment. Language is fluent with intact comprehension.  Cranial Nerves: Visual acuity is grossly normal. Visual fields are full. Extra-ocular movements intact. No ptosis. Face is symmetric Motor: Tone and bulk are normal. Left leg 4/5 proximally, 2/5 distally, left arm 4+/5. Reflexes are symmetric, no pathologic reflexes present.  Sensory: Intact to light touch Gait: Hemiparetic   Labs: I have reviewed the data as listed    Component Value Date/Time   NA 136 10/20/2020 0440   K 3.8 10/20/2020 0440   CL 100 10/20/2020 0440   CO2 27 10/20/2020 0440   GLUCOSE 92 10/20/2020 0440   BUN 11 10/20/2020 0440   CREATININE 1.09 10/20/2020 0440   CALCIUM 8.8 (L) 10/20/2020 0440   PROT 5.5 (L) 10/02/2020 0457   ALBUMIN 2.9 (L) 10/02/2020 0457   AST 22 10/02/2020 0457   ALT 44 10/02/2020 0457   ALKPHOS 77 10/02/2020 0457   BILITOT 0.9 10/02/2020 0457   GFRNONAA >60 10/20/2020 0440   Lab Results  Component Value Date   WBC 7.1 10/20/2020   NEUTROABS 12.9 (H) 10/02/2020   HGB 12.6 (L) 10/20/2020   HCT 38.4 (L) 10/20/2020   MCV 87.1 10/20/2020   PLT 266 10/20/2020   Imaging:  [facility]  Clinician Interpretation: I have personally reviewed the CNS images as listed.  My interpretation, in the context of the patient's clinical presentation, is treatment effect vs true progression  MR BRAIN W WO CONTRAST  Result Date: 03/27/2021 CLINICAL DATA:  Follow-up post meningioma removal, left leg weakness EXAM: MRI HEAD WITHOUT AND WITH CONTRAST TECHNIQUE: Multiplanar, multiecho pulse sequences of the brain and surrounding structures were obtained without and with intravenous contrast. CONTRAST:  9.28mL GADAVIST GADOBUTROL 1 MMOL/ML IV SOLN COMPARISON:  Brain MRI 09/25/2020 FINDINGS: Brain: Postsurgical changes reflecting right parietal craniotomy for mass resection are again seen. There is minimal thickening along the right aspect of the falx at the site of the previously seen meningioma, minimally increased since the first postoperative study of 09/25/2020 (18-129, 19-13). There is no other new or progressive enhancement. FLAIR signal abnormality in the underlying brain parenchyma has significantly decreased compared to the initial postoperative study. A small amount of blood products are noted at the surgical site, also decreased in the interim.  Previously seen diffusion restriction along the inferior margin of the resection site extending into the corpus callosum has resolved, now with evolving encephalomalacia. There is no evidence of acute intracranial hemorrhage, extra-axial fluid collection, or acute infarct. Parenchymal volume is normal. Scattered foci of FLAIR signal abnormality in the subcortical and periventricular white matter are again seen likely reflecting sequela of mild chronic white matter microangiopathy. There is no new mass lesion.  There is no midline shift. Vascular: Normal flow voids. Skull and upper cervical spine: There is no suspicious marrow signal. Postsurgical changes are noted in the right parietal bone as above. Sinuses/Orbits: There is extensive mucosal thickening throughout  the paranasal sinuses with layering fluid in the right maxillary sinus, similar to the prior study. The globes and orbits are unremarkable. Other: None. IMPRESSION: 1. Status post right parafalcine meningioma resection. There is minimal thickening along the right aspect of the falx which is increased from the prior study but is nonspecific. Recommend continued attention on follow-up. 2. Significantly decreased FLAIR signal abnormality in the brain parenchyma surrounding the resection site and resolved diffusion restriction, now with evolving encephalomalacia. 3. No new acute intracranial pathology. Electronically Signed   By: Valetta Mole M.D.   On: 03/27/2021 16:18     Assessment/Plan Meningioma, cerebral ([place])  Focal seizures ([facility])  Brent Harrington is clinically stable today.  Brain MRI demonstrate some thickening along the falx at site of resection.  Etiology is either post-surgical change from cavity collapse, or early and modest tumor regrowth.    Seizures are well controlled now on lower dose of Keppra 500mg  BID.  Left sided weakness has improved modestly.   We ask that Brent Harrington return to clinic in 6 months following next brain MRI, or sooner as needed.  All questions were answered. The patient knows to call the clinic with any problems, questions or concerns. No barriers to learning were detected.  The total time spent in the encounter was 40 minutes and more than 50% was on counseling and review of test results   Ventura Sellers, MD Medical Director of Neuro-Oncology [facility] [place] 03/30/21 11:17 AM

## 2021-03-31 ENCOUNTER — Telehealth: Payer: Self-pay | Admitting: Internal Medicine

## 2021-03-31 NOTE — Telephone Encounter (Signed)
Scheduled follow-up appointment per 1/9 los. Patient is aware. °

## 2021-07-23 ENCOUNTER — Telehealth: Payer: Self-pay | Admitting: *Deleted

## 2021-07-23 ENCOUNTER — Other Ambulatory Visit: Payer: Self-pay | Admitting: *Deleted

## 2021-07-23 DIAGNOSIS — D320 Benign neoplasm of cerebral meninges: Secondary | ICD-10-CM

## 2021-07-23 NOTE — Telephone Encounter (Signed)
Spoke with wife and she agreed to move up the MRI.  Order updated. She will call to schedule and call back to reschedule MD follow up ?

## 2021-07-23 NOTE — Telephone Encounter (Signed)
Received voicemail from Brent Harrington (stating she was patients daughter).  Have no record on file approval to speak to this patient.   She states that she is growing concerned for him.  States that his arm and leg are going numb again.  He is having balance issues and dizziness that has been going on for 3 weeks and progressively getting worse.  Also notes that he has a hard spot on his scalp.  His next imaging is not until July and was concerned if this should be moved up or if he should see Dr Mickeal Skinner first. ? ?Attempted to reach patient and/or wife directly and left message on home line to clarify the details provided.    ?

## 2021-07-23 NOTE — Telephone Encounter (Signed)
Attempted to reach patient again and left voicemail for patient.  Will discuss upon his returned call. ?

## 2021-07-28 ENCOUNTER — Telehealth: Payer: Self-pay | Admitting: Internal Medicine

## 2021-07-28 NOTE — Telephone Encounter (Signed)
.  Called patient to schedule appointment per 5.9 inbasket, patient is aware of date and time.   ?

## 2021-07-31 ENCOUNTER — Ambulatory Visit (HOSPITAL_BASED_OUTPATIENT_CLINIC_OR_DEPARTMENT_OTHER)
Admission: RE | Admit: 2021-07-31 | Discharge: 2021-07-31 | Disposition: A | Discharge status: Home / Self Care | Payer: Medicare HMO | Source: Ambulatory Visit | Attending: Internal Medicine | Admitting: Internal Medicine

## 2021-07-31 DIAGNOSIS — J339 Nasal polyp, unspecified: Secondary | ICD-10-CM | POA: Insufficient documentation

## 2021-07-31 DIAGNOSIS — D320 Benign neoplasm of cerebral meninges: Secondary | ICD-10-CM | POA: Diagnosis present

## 2021-07-31 MED ORDER — GADOBUTROL 1 MMOL/ML IV SOLN
9.5000 mL | Freq: Once | INTRAVENOUS | Status: AC | PRN
  Administered 2021-07-31: 9.5 mL via INTRAVENOUS
  Filled 2021-07-31: qty 10

## 2021-08-03 ENCOUNTER — Inpatient Hospital Stay: Payer: Medicare HMO | Admitting: Internal Medicine

## 2021-08-03 ENCOUNTER — Inpatient Hospital Stay: Payer: Medicare HMO | Attending: Internal Medicine

## 2021-08-03 ENCOUNTER — Other Ambulatory Visit: Payer: Self-pay

## 2021-08-03 VITALS — BP 115/78 | HR 75 | Temp 97.5°F | Resp 18 | Ht 71.0 in | Wt 206.8 lb

## 2021-08-03 DIAGNOSIS — J320 Chronic maxillary sinusitis: Secondary | ICD-10-CM | POA: Insufficient documentation

## 2021-08-03 DIAGNOSIS — D320 Benign neoplasm of cerebral meninges: Secondary | ICD-10-CM

## 2021-08-03 DIAGNOSIS — R531 Weakness: Secondary | ICD-10-CM | POA: Diagnosis not present

## 2021-08-03 DIAGNOSIS — Z833 Family history of diabetes mellitus: Secondary | ICD-10-CM | POA: Diagnosis not present

## 2021-08-03 DIAGNOSIS — R569 Unspecified convulsions: Secondary | ICD-10-CM

## 2021-08-03 DIAGNOSIS — Z79899 Other long term (current) drug therapy: Secondary | ICD-10-CM | POA: Insufficient documentation

## 2021-08-03 DIAGNOSIS — I6782 Cerebral ischemia: Secondary | ICD-10-CM | POA: Diagnosis not present

## 2021-08-03 DIAGNOSIS — J339 Nasal polyp, unspecified: Secondary | ICD-10-CM | POA: Insufficient documentation

## 2021-08-03 DIAGNOSIS — Z85038 Personal history of other malignant neoplasm of large intestine: Secondary | ICD-10-CM | POA: Insufficient documentation

## 2021-08-03 DIAGNOSIS — R251 Tremor, unspecified: Secondary | ICD-10-CM | POA: Insufficient documentation

## 2021-08-03 DIAGNOSIS — Z8249 Family history of ischemic heart disease and other diseases of the circulatory system: Secondary | ICD-10-CM | POA: Diagnosis not present

## 2021-08-03 MED ORDER — LEVETIRACETAM 750 MG PO TABS: 750.0000 mg | ORAL_TABLET | Freq: Two times a day (BID) | ORAL | 5 refills | Status: DC

## 2021-08-03 NOTE — Progress Notes (Signed)
? ?[facility] [address]  ?[place], [place] [address] ?[phone] ? ? ?Interval Evaluation ? ?Date of Service: 08/03/21 ?Patient Name: Brent Harrington ?Patient MRN: [id number] ?Patient DOB: Dec 20, 1950 ?Provider: Ventura Sellers, MD ? ?Identifying Statement:  ?Brent Harrington is a 71 y.o. male with right parietal meningioma  ? ?Oncologic History ?09/24/20: R parafalcine meningioma resected by Dr. Marcello Moores ? ?Interval History: ?Brent Harrington presents today for follow up after recent MRI brain.  He describes no new or progressive neurologic deficits.  Left leg weakness is overall unchanged, he continues to use a cane for ambulation.  No significant problems with his left arm or hand.  Planning trip to [place] in the summer.  ? ?H+P (10/27/20) Patient presented to medical attention in April 2022 with first ever seizure, characterized as "left leg shaking for several minutes, followed by weakness".  These events would recur every 2-3 weeks.  CNS imaging demonstrated a right fronto-parietal meningioma, which grew in size on follow up MRI.  This was resected by Dr. Marcello Moores on 09/24/20.  Following surgery, he developed significant weakness of his left arm and leg.  Currently (1 month post-op) his arm is back to full strength, but the leg is still very weak.  He is relying on a wheelchair currently for all transport, even around the home.  Modest improvement only through physical therapy. ? ?Medications: ?Current Outpatient Medications on File Prior to Visit  ?Medication Sig Dispense Refill  ? aspirin EC 81 MG tablet Take 81 mg by mouth daily. Swallow whole.    ? atorvastatin (LIPITOR) 10 MG tablet Take 1 tablet (10 mg total) by mouth daily. 30 tablet 0  ? escitalopram (LEXAPRO) 10 MG tablet Take 10 mg by mouth daily.    ? levETIRAcetam (KEPPRA) 500 MG tablet Take 500 mg by mouth 2 (two) times daily.    ? ?No current facility-administered medications on file prior to visit.  ? ? ?Allergies:  No Known Allergies ?Past Medical History:  ?Past Medical History:  ?Diagnosis Date  ? Anxiety   ? Cancer [facility]) 2012  ? Colon Cancer  ? ?Past Surgical History:  ?Past Surgical History:  ?Procedure Laterality Date  ? APPLICATION OF CRANIAL NAVIGATION Right 09/24/2020  ? Procedure: APPLICATION OF CRANIAL NAVIGATION;  Surgeon: Vallarie Mare, MD;  Location: [facility];  Service: Neurosurgery;  Laterality: Right;  ? COLON SURGERY  2012  ? CRANIOTOMY Right 09/24/2020  ? Procedure: RIGHT CRANIOTOMY FOR RESECTION OF FALCINE MENINGIOMA;  Surgeon: Vallarie Mare, MD;  Location: [facility];  Service: Neurosurgery;  Laterality: Right;  ? ?Social History:  ?Social History  ? ?Socioeconomic History  ? Marital status: Married  ?  Spouse name: Not on file  ? Number of children: Not on file  ? Years of education: Not on file  ? Highest education level: Not on file  ?Occupational History  ?  Comment: Horticulturist, commercial, [place], [place]  ?Tobacco Use  ? Smoking status: Never  ? Smokeless tobacco: Never  ?Vaping Use  ? Vaping Use: Never used  ?Substance and Sexual Activity  ? Alcohol use: Not Currently  ? Drug use: Never  ? Sexual activity: Not Currently  ?Other Topics Concern  ? Not on file  ?Social History Narrative  ? Not on file  ? ?Social Determinants of Health  ? ?Financial Resource Strain: Not on file  ?Food Insecurity: Not on file  ?Transportation Needs: Not on file  ?Physical Activity: Not on file  ?  Stress: Not on file  ?Social Connections: Not on file  ?Intimate Partner Violence: Not on file  ? ?Family History:  ?Family History  ?Problem Relation Age of Onset  ? Hypertension Mother   ? Heart disease Father   ? Diabetes Sister   ? ? ?Review of Systems: ?Constitutional: Doesn't report fevers, chills or abnormal weight loss ?Eyes: Doesn't report blurriness of vision ?Ears, nose, mouth, throat, and face: Doesn't report sore throat ?Respiratory: Doesn't report cough, dyspnea or wheezes ?Cardiovascular: Doesn't report palpitation,  chest discomfort  ?Gastrointestinal:  Doesn't report nausea, constipation, diarrhea ?GU: Doesn't report incontinence ?Skin: Doesn't report skin rashes ?Neurological: Per HPI ?Musculoskeletal: Doesn't report joint pain ?Behavioral/Psych: Doesn't report anxiety ? ?Physical Exam: ?Vitals:  ? 08/03/21 1426  ?BP: 115/78  ?Pulse: 75  ?Resp: 18  ?Temp: (!) 97.5 ?F (36.4 ?C)  ?SpO2: 99%  ? ? ?KPS: 70 ?General: Alert, cooperative, pleasant, in no acute distress ?Head: Normal ?EENT: No conjunctival injection or scleral icterus.  ?Lungs: Resp effort normal ?Cardiac: Regular rate ?Abdomen: Non-distended abdomen ?Skin: No rashes cyanosis or petechiae. ?Extremities: No clubbing or edema ? ?Neurologic Exam: ?Mental Status: Awake, alert, attentive to examiner. Oriented to self and environment. Language is fluent with intact comprehension.  ?Cranial Nerves: Visual acuity is grossly normal. Visual fields are full. Extra-ocular movements intact. No ptosis. Face is symmetric ?Motor: Tone and bulk are normal. Left leg 4/5 proximally, 2/5 distally, left arm 4+/5. Reflexes are symmetric, no pathologic reflexes present.  ?Sensory: Intact to light touch ?Gait: Hemiparetic ? ? ?Labs: ?I have reviewed the data as listed ?   ?Component Value Date/Time  ? NA 136 10/20/2020 0440  ? K 3.8 10/20/2020 0440  ? CL 100 10/20/2020 0440  ? CO2 27 10/20/2020 0440  ? GLUCOSE 92 10/20/2020 0440  ? BUN 11 10/20/2020 0440  ? CREATININE 1.09 10/20/2020 0440  ? CALCIUM 8.8 (L) 10/20/2020 0440  ? PROT 5.5 (L) 10/02/2020 0457  ? ALBUMIN 2.9 (L) 10/02/2020 0457  ? AST 22 10/02/2020 0457  ? ALT 44 10/02/2020 0457  ? ALKPHOS 77 10/02/2020 0457  ? BILITOT 0.9 10/02/2020 0457  ? GFRNONAA >60 10/20/2020 0440  ? ?Lab Results  ?Component Value Date  ? WBC 7.1 10/20/2020  ? NEUTROABS 12.9 (H) 10/02/2020  ? HGB 12.6 (L) 10/20/2020  ? HCT 38.4 (L) 10/20/2020  ? MCV 87.1 10/20/2020  ? PLT 266 10/20/2020  ? ?Imaging: ? ?[facility] Clinician Interpretation: I have personally  reviewed the CNS images as listed.  My interpretation, in the context of the patient's clinical presentation, is treatment effect vs true progression ? ?MR Brain W Wo Contrast ? ?Result Date: 08/03/2021 ?CLINICAL DATA:  Follow-up meningioma EXAM: MRI HEAD WITHOUT AND WITH CONTRAST TECHNIQUE: Multiplanar, multiecho pulse sequences of the brain and surrounding structures were obtained without and with intravenous contrast. CONTRAST:  9.26m GADAVIST GADOBUTROL 1 MMOL/ML IV SOLN COMPARISON:  03/27/2021 FINDINGS: Brain: Stable minimal thickening of the right paramedian falx at the level of tumor resection, area measuring less up to 8 mm. T2 hyperintensity in the adjacent brain is similar to before but posteriorly there is some increase in T2 signal and possibly and mild swelling, see 11:33 and compare with 17:10 on prior. No complicating infarct, hemorrhage, hydrocephalus, or collection. Mild chronic small vessel ischemic change in the cerebral white matter. 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 Maxillary sinus disease is improved. Bilateral nasal cavity polyps. IMPRESSION: 1. Stable small right parafalcine nodular enhancement at the tumor resection site. Regional T2 hyperintensity is minimally increased, evolution suggesting a mix of edema and gliosis. 2. Sinonasal polyposis. Electronically Signed   By: Jorje Guild M.D.   On: 08/03/2021 05:11   ? ? ?Assessment/Plan ?Meningioma, cerebral ([place]) ? ?Focal seizures ([place]) ? ?Duvan Mousel is clinically stable today.  Brain MRI suggests very mild inflammation, no change in volume of enhancement c/w tumor recurrence. ? ?Recommended resuming Keppra '750mg'$  BID. ? ?We ask that Brent Harrington return to clinic in 1 year following next brain MRI, or sooner as needed. ? ?All  questions were answered. The patient knows to call the clinic with any problems, questions or concerns. No barriers to learning were detected. ? ?The total time spent in the encounter was 30 minutes and more

## 2021-08-10 ENCOUNTER — Other Ambulatory Visit: Payer: Self-pay | Admitting: *Deleted

## 2021-08-10 DIAGNOSIS — D320 Benign neoplasm of cerebral meninges: Secondary | ICD-10-CM

## 2021-09-28 ENCOUNTER — Ambulatory Visit: Payer: Medicare HMO | Admitting: Internal Medicine

## 2021-12-01 ENCOUNTER — Ambulatory Visit: Payer: Medicare HMO | Admitting: Urology

## 2021-12-01 ENCOUNTER — Encounter: Payer: Self-pay | Admitting: Urology

## 2021-12-01 VITALS — BP 128/85 | HR 80

## 2021-12-01 DIAGNOSIS — R972 Elevated prostate specific antigen [PSA]: Secondary | ICD-10-CM | POA: Diagnosis not present

## 2021-12-01 MED ORDER — DIAZEPAM 10 MG PO TABS: 10.0000 mg | ORAL_TABLET | Freq: Once | ORAL | 0 refills | Status: AC

## 2021-12-01 MED ORDER — LEVOFLOXACIN 750 MG PO TABS: 750.0000 mg | ORAL_TABLET | Freq: Once | ORAL | 0 refills | Status: AC

## 2021-12-01 NOTE — Patient Instructions (Addendum)
Transrectal Ultrasound-Guided Prostate Biopsy A transrectal ultrasound-guided prostate biopsy is a procedure to remove samples of prostate tissue for testing. The prostate is a walnut-sized gland that is located below the bladder and in front of the rectum. During this procedure, a small device (probe) is lubricated and put inside the rectum. The probe sends out sound waves that make a picture of the prostate and surrounding tissues (transrectal ultrasound). The images are used to help guide the process of removing the samples. The samples are taken to a lab to be checked for prostate cancer. This procedure is usually done to evaluate the prostate gland of men who have raised (elevated) levels of prostate-specific antigen (PSA), which can be a sign of prostate cancer or prostate enlargement related to aging (benign prostatic hyperplasia, or BPH). Tell a health care provider about: Any allergies you have. All medicines you are taking, including vitamins, herbs, eye drops, creams, and over-the-counter medicines. Any problems you or family members have had with anesthetic medicines. Any bleeding problems you have. Any surgeries you have had. Any medical conditions you have. Any prostate infections you have had. What are the risks? Generally, this is a safe procedure. However, problems may occur, including: Prostate infection. Bleeding from the rectum. Blood in the urine. Allergic reactions to medicines. Damage to surrounding structures such as blood vessels, organs, or muscles. Difficulty passing urine. Nerve damage. This is usually temporary. What happens before the procedure? Medicines Ask your health care provider about: Changing or stopping your regular medicines. This is especially important if you are taking diabetes medicines or blood thinners. Taking medicines such as aspirin and ibuprofen. These medicines can thin your blood. Do not take these medicines unless your health care provider  tells you to take them. Taking over-the-counter medicines, vitamins, herbs, and supplements. General instructions Follow instructions from your health care provider about eating and drinking. In most instances, you will not need to stop eating and drinking completely before the procedure. You will be given an enema. During an enema, a liquid is injected into your rectum to clear out waste. You may have a blood or urine sample taken. Ask your health care provider what steps will be taken to help prevent infection. These steps may include: Washing skin with a germ-killing soap. Taking antibiotic medicine. If you will be going home right after the procedure, plan to have a responsible adult: Take you home from the hospital or clinic. You will not be allowed to drive. Care for you for the time you are told. What happens during the procedure?  An IV will be inserted into one of your veins. You will be given one or both of the following: A medicine to help you relax (sedative). A medicine to numb the area (local anesthetic). You will be placed on your left side, and your knees will be bent toward your chest. A probe with lubricated gel will be placed into your rectum, and images will be taken of your prostate and surrounding structures. Numbing medicine will be injected into your prostate. A biopsy needle will be inserted through your rectum or perineum and guided to your prostate using the ultrasound images. Prostate tissue samples will be removed, and the needle and probe will then be removed. The biopsy samples will be sent to a lab to be tested. The procedure may vary among health care providers and hospitals. What happens after the procedure? Your blood pressure, heart rate, breathing rate, and blood oxygen level will be monitored until you leave  the hospital or clinic. You may have some discomfort in the rectal area. You will be given pain medicine as needed. If you were given a sedative  during the procedure, it can affect you for several hours. Do not drive or operate machinery until your health care provider says that it is safe. It is up to you to get the results of your procedure. Ask your health care provider, or the department that is doing the procedure, when your results will be ready. Keep all follow-up visits. This is important. Summary A transrectal ultrasound-guided biopsy removes samples of tissue from your prostate using ultrasound-guided sound waves to help guide the process. This procedure is usually done to evaluate the prostate gland of men who have raised (elevated) levels of prostate-specific antigen (PSA), which can be a sign of prostate cancer or prostate enlargement related to aging. After your procedure, you may feel some discomfort in the rectal area. Plan to have a responsible adult take you home from the hospital or clinic, and follow up with your health care provider for your results. This information is not intended to replace advice given to you by your health care provider. Make sure you discuss any questions you have with your health care provider. Document Revised: 09/01/2020 Document Reviewed: 09/01/2020 Elsevier Patient Education  [place].        Appointment Time: December 30, 2021 Appointment Date: 12:15 pm  Location: [facility]   Prostate Biopsy Instructions  Stop all aspirin or blood thinners (aspirin, plavix, coumadin, warfarin, motrin, ibuprofen, advil, aleve, naproxen, naprosyn) for 7 days prior to the procedure.  If you have any questions about stopping these medications, please contact your primary care physician or cardiologist.  Having a light meal prior to the procedure is recommended.  If you are diabetic or have low blood sugar please bring a small snack or glucose tablet.  A Fleets enema is needed to be purchased over the counter at a local pharmacy and used 2 hours before you scheduled  appointment.  This can be purchased over the counter at any pharmacy.  Antibiotics will be administered in the clinic at the time of the procedure and 1 tablet has been sent to your pharmacy. Please take the antibiotic as prescribed.    Please bring someone with you to the procedure to drive you home if you are given a valium to take prior to your procedure.   If you have any questions or concerns, please feel free to call the office at [phone] or send a Mychart message.    Thank you, [facility]

## 2021-12-01 NOTE — Progress Notes (Signed)
12/01/2021 11:33 AM   Carolyn Stare 04/13/1950 [id number]  Referring provider: Coolidge Breeze, FNP [address] [place],  [place] [address]  Elevated PSA   HPI: Mr Labrada is a 71yo here for evaluation of elevated PSA. PSA has increased from 8.5 last year to 11.7 this year. He has a hx of prostate biopsy 14-15 years ago for a PSA of 4.5 at [facility]. No significant LUTS.    PMH: Past Medical History:  Diagnosis Date   Anxiety    Cancer [facility]) 2012   Colon Cancer    Surgical History: Past Surgical History:  Procedure Laterality Date   APPLICATION OF CRANIAL NAVIGATION Right 09/24/2020   Procedure: APPLICATION OF CRANIAL NAVIGATION;  Surgeon: Vallarie Mare, MD;  Location: [facility];  Service: Neurosurgery;  Laterality: Right;   COLON SURGERY  2012   CRANIOTOMY Right 09/24/2020   Procedure: RIGHT CRANIOTOMY FOR RESECTION OF FALCINE MENINGIOMA;  Surgeon: Vallarie Mare, MD;  Location: [facility];  Service: Neurosurgery;  Laterality: Right;    Home Medications:  Allergies as of 12/01/2021   No Known Allergies      Medication List        Accurate as of December 01, 2021 11:33 AM. If you have any questions, ask your nurse or doctor.          aspirin EC 81 MG tablet Take 81 mg by mouth daily. Swallow whole.   atorvastatin 10 MG tablet Commonly known as: LIPITOR Take 1 tablet (10 mg total) by mouth daily.   escitalopram 10 MG tablet Commonly known as: LEXAPRO Take 10 mg by mouth daily.   levETIRAcetam 750 MG tablet Commonly known as: KEPPRA Take 1 tablet (750 mg total) by mouth 2 (two) times daily.        Allergies: No Known Allergies  Family History: Family History  Problem Relation Age of Onset   Hypertension Mother    Heart disease Father    Diabetes Sister     Social History:  reports that he has never smoked. He has never used smokeless tobacco. He reports that he does not currently use alcohol. He reports that he does not use  drugs.  ROS: All other review of systems were reviewed and are negative except what is noted above in HPI  Physical Exam: BP 128/85   Pulse 80   Constitutional:  Alert and oriented, No acute distress. HEENT: [place] AT, moist mucus membranes.  Trachea midline, no masses. Cardiovascular: No clubbing, cyanosis, or edema. Respiratory: Normal respiratory effort, no increased work of breathing. GI: Abdomen is soft, nontender, nondistended, no abdominal masses GU: No CVA tenderness. Circumcised phallus. No masses/lesions on penis, testis, scrotum. Prostate 60g smooth no nodules no induration.  Lymph: No cervical or inguinal lymphadenopathy. Skin: No rashes, bruises or suspicious lesions. Neurologic: Grossly intact, no focal deficits, moving all 4 extremities. Psychiatric: Normal mood and affect.  Laboratory Data: Lab Results  Component Value Date   WBC 7.1 10/20/2020   HGB 12.6 (L) 10/20/2020   HCT 38.4 (L) 10/20/2020   MCV 87.1 10/20/2020   PLT 266 10/20/2020    Lab Results  Component Value Date   CREATININE 1.09 10/20/2020    No results found for: "PSA"  No results found for: "TESTOSTERONE"  No results found for: "HGBA1C"  Urinalysis No results found for: "COLORURINE", "APPEARANCEUR", "LABSPEC", "PHURINE", "GLUCOSEU", "HGBUR", "BILIRUBINUR", "KETONESUR", "PROTEINUR", "UROBILINOGEN", "NITRITE", "LEUKOCYTESUR"  No results found for: "LABMICR", "WBCUA", "RBCUA", "LABEPIT", "MUCUS", "BACTERIA"  Pertinent  Imaging:  No results found for this or any previous visit.  No results found for this or any previous visit.  No results found for this or any previous visit.  No results found for this or any previous visit.  No results found for this or any previous visit.  No results found for this or any previous visit.  No results found for this or any previous visit.  No results found for this or any previous visit.   Assessment & Plan:    1. Elevated PSA The patient and I  talked about etiologies of elevated PSA.  We discussed the possible relationship between elevated PSA, prostate cancer, BPH, prostatitis, and UTI.   Conservative treatment of elevated PSA with watchful waiting was discussed with the patient.  All questions were answered.        All of the risks and benefits along with alternatives to prostate biopsy were discussed with the patient.  The patient gave fully informed consent to proceed with a transrectal ultrasound guided biopsy of the prostate for the evaluation of their evated PSA.  Prostate biopsy instructions and antibiotics were given to the patient.    No follow-ups on file.  Nicolette Bang, MD  [facility] [place]

## 2021-12-30 ENCOUNTER — Encounter: Payer: Self-pay | Admitting: Urology

## 2021-12-30 ENCOUNTER — Other Ambulatory Visit: Payer: Self-pay | Admitting: Urology

## 2021-12-30 ENCOUNTER — Ambulatory Visit (HOSPITAL_BASED_OUTPATIENT_CLINIC_OR_DEPARTMENT_OTHER): Payer: Medicare HMO | Admitting: Urology

## 2021-12-30 ENCOUNTER — Encounter (HOSPITAL_COMMUNITY): Payer: Self-pay

## 2021-12-30 ENCOUNTER — Ambulatory Visit (HOSPITAL_COMMUNITY)
Admission: RE | Admit: 2021-12-30 | Discharge: 2021-12-30 | Disposition: A | Discharge status: Home / Self Care | Payer: Medicare HMO | Source: Ambulatory Visit | Attending: Urology | Admitting: Urology

## 2021-12-30 DIAGNOSIS — D075 Carcinoma in situ of prostate: Secondary | ICD-10-CM

## 2021-12-30 DIAGNOSIS — R972 Elevated prostate specific antigen [PSA]: Secondary | ICD-10-CM

## 2021-12-30 MED ORDER — GENTAMICIN SULFATE 40 MG/ML IJ SOLN
INTRAMUSCULAR | Status: AC
  Administered 2021-12-30: 80 mg via INTRAMUSCULAR
  Filled 2021-12-30: qty 2

## 2021-12-30 MED ORDER — GENTAMICIN SULFATE 40 MG/ML IJ SOLN: 80.0000 mg | Freq: Once | INTRAMUSCULAR | Status: AC

## 2021-12-30 MED ORDER — LIDOCAINE HCL (PF) 2 % IJ SOLN
10.0000 mL | Freq: Once | INTRAMUSCULAR | Status: AC
  Administered 2021-12-30: 10 mL

## 2021-12-30 NOTE — Progress Notes (Signed)
Prostate Biopsy Procedure   Informed consent was obtained after discussing risks/benefits of the procedure.  A time out was performed to ensure correct patient identity.  Pre-Procedure: - Last PSA Level: No results found for: "PSA" - Gentamicin given prophylactically - Levaquin 500 mg administered PO -Transrectal Ultrasound performed revealing a 106.4 gm prostate -No significant hypoechoic or median lobe noted  Procedure: - Prostate block performed using 10 cc 1% lidocaine and biopsies taken from sextant areas, a total of 12 under ultrasound guidance.  Post-Procedure: - Patient tolerated the procedure well - He was counseled to seek immediate medical attention if experiences any severe pain, significant bleeding, or fevers - Return in one week to discuss biopsy results

## 2021-12-30 NOTE — Progress Notes (Signed)
PT tolerated prostate biopsy procedure and antibiotic injection well today. Labs obtained and sent for pathology. PT ambulatory at discharge with walker with no acute distress noted and verbalized understanding of discharge instructions. PT to follow up with urologist as scheduled on 01/08/22 '@12'$ :40 pm.

## 2021-12-30 NOTE — Patient Instructions (Signed)
Transrectal Ultrasound-Guided Prostate Biopsy, Care After What can I expect after the procedure? After the procedure, it is common to have: Pain and discomfort near your butt (rectum), especially while sitting. Pink-colored pee (urine). This is due to small amounts of blood in your pee. A burning feeling while peeing. Blood in your poop (stool). Bleeding from your butt. Blood in your semen. Follow these instructions at home: Medicines Take over-the-counter and prescription medicines only as told by your doctor. If you were given a sedative during your procedure, do not drive or use machines until your doctor says that it is safe. A sedative is a medicine that helps you relax. If you were prescribed an antibiotic medicine, take it as told by your doctor. Do not stop taking it even if you start to feel better. Activity  Return to your normal activities when your doctor says that it is safe. Ask your doctor when it is okay for you to have sex. You may have to avoid lifting. Ask your doctor how much you can safely lift. General instructions  Drink enough water to keep your pee pale yellow. Watch your pee, poop, and semen for new bleeding or bleeding that gets worse. Keep all follow-up visits. Contact a doctor if: You have any of these: Blood clots in your pee or poop. Blood in your pee more than 2 weeks after the procedure. Blood in your semen more than 2 months after the procedure. New or worse bleeding in your pee, poop, or semen. Very bad belly pain. Your pee smells bad or unusual. You have trouble peeing. Your lower belly feels firm. You have problems getting an erection. You feel like you may vomit (are nauseous), or you vomit. Get help right away if: You have a fever or chills. You have bright red pee. You have very bad pain that does not get better with medicine. You cannot pee. Summary After this procedure, it is common to have pain and discomfort near your butt,  especially while sitting. You may have blood in your pee and poop. It is common to have blood in your semen. Get help right away if you have a fever or chills. This information is not intended to replace advice given to you by your health care provider. Make sure you discuss any questions you have with your health care provider. Document Revised: 09/01/2020 Document Reviewed: 09/01/2020 Elsevier Patient Education  2023 Elsevier Inc.  

## 2022-01-04 ENCOUNTER — Telehealth: Payer: Self-pay

## 2022-01-04 ENCOUNTER — Other Ambulatory Visit: Payer: Self-pay

## 2022-01-04 DIAGNOSIS — R972 Elevated prostate specific antigen [PSA]: Secondary | ICD-10-CM

## 2022-01-04 NOTE — Telephone Encounter (Signed)
Patient called and made aware of negative biopsy results.  Per Dr. Alyson Ingles patient will need to f/u in 6 months with a P[facility]. Appointments made and sent to patient via mail.

## 2022-01-08 ENCOUNTER — Ambulatory Visit: Payer: Medicare HMO | Admitting: Urology

## 2022-01-30 ENCOUNTER — Other Ambulatory Visit: Payer: Self-pay | Admitting: Internal Medicine

## 2022-07-01 ENCOUNTER — Other Ambulatory Visit: Payer: Self-pay

## 2022-07-01 ENCOUNTER — Other Ambulatory Visit: Payer: Medicare HMO

## 2022-07-01 DIAGNOSIS — R972 Elevated prostate specific antigen [PSA]: Secondary | ICD-10-CM

## 2022-07-02 LAB — PSA: Prostate Specific Ag, Serum: 11.7 ng/mL — ABNORMAL HIGH (ref 0.0–4.0)

## 2022-07-07 ENCOUNTER — Ambulatory Visit: Payer: Medicare HMO | Admitting: Urology

## 2022-07-07 ENCOUNTER — Encounter: Payer: Self-pay | Admitting: Urology

## 2022-07-07 VITALS — BP 113/73 | HR 73

## 2022-07-07 DIAGNOSIS — R972 Elevated prostate specific antigen [PSA]: Secondary | ICD-10-CM | POA: Diagnosis not present

## 2022-07-07 LAB — URINALYSIS, ROUTINE W REFLEX MICROSCOPIC
Bilirubin, UA: NEGATIVE
Glucose, UA: NEGATIVE
Ketones, UA: NEGATIVE
Leukocytes,UA: NEGATIVE
Nitrite, UA: NEGATIVE
Protein,UA: NEGATIVE
RBC, UA: NEGATIVE
Specific Gravity, UA: 1.025 (ref 1.005–1.030)
Urobilinogen, Ur: 0.2 mg/dL (ref 0.2–1.0)
pH, UA: 5.5 (ref 5.0–7.5)

## 2022-07-07 NOTE — Patient Instructions (Signed)

## 2022-07-07 NOTE — Progress Notes (Signed)
07/07/2022 11:47 AM   Brent Harrington [birth year] [id number]  Referring provider: [facility] [address] [place],  [place] [address]  Followup elevated PSA   HPI: Mr Brent Harrington is a 71yo here followup for elevated PSA. PSA stable at 11.7. IPSS 2 QOL 0 on no BPH therapy. No straining to urinate. No other complaints today.    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 If you have any questions, ask your nurse or doctor.          aspirin EC 81 MG tablet Take 81 mg by mouth daily. Swallow whole.   atorvastatin 10 MG tablet Commonly known as: LIPITOR Take 1 tablet (10 mg total) by mouth daily.   escitalopram 10 MG tablet Commonly known as: LEXAPRO Take 10 mg by mouth daily.   levETIRAcetam 750 MG tablet Commonly known as: KEPPRA TAKE 1 TABLET BY MOUTH 2 TIMES DAILY.        Allergies: No Known Allergies  Family History: Family History  Problem Relation Age of Onset   Hypertension Mother    Heart disease Father    Diabetes Sister     Social History:  reports that he has never smoked. He has never used smokeless tobacco. He reports that he does not currently use alcohol. He reports that he does not use drugs.  ROS: All other review of systems were reviewed and are negative  except what is noted above in HPI  Physical Exam: BP 113/73   Pulse 73   Constitutional:  Alert and oriented, No acute distress. HEENT: [place] AT, moist mucus membranes.  Trachea midline, no masses. Cardiovascular: No clubbing, cyanosis, or edema. Respiratory: Normal respiratory effort, no increased work of breathing. GI: Abdomen is soft, nontender, nondistended, no abdominal masses GU: No CVA tenderness.  Lymph: No cervical or inguinal lymphadenopathy. Skin: No rashes, bruises or suspicious lesions. Neurologic: Grossly intact, no focal deficits, moving all 4 extremities. Psychiatric: Normal mood and affect.  Laboratory Data: Lab Results  Component Value Date   WBC 7.1 10/20/2020   HGB 12.6 (L) 10/20/2020   HCT 38.4 (L) 10/20/2020   MCV 87.1 10/20/2020   PLT 266 10/20/2020    Lab Results  Component Value Date   CREATININE 1.09 10/20/2020    No results found for: "PSA"  No results found for: "TESTOSTERONE"  No results found for: "HGBA1C"  Urinalysis No results found for: "COLORURINE", "APPEARANCEUR", "LABSPEC", "PHURINE", "GLUCOSEU", "HGBUR", "BILIRUBINUR", "KETONESUR", "PROTEINUR", "UROBILINOGEN", "NITRITE", "LEUKOCYTESUR"  No results found for: "LABMICR", "WBCUA", "RBCUA", "LABEPIT", "MUCUS", "BACTERIA"  Pertinent Imaging:  No results found for this or any previous visit.  No results found for this or any previous visit.  No results found for this or any previous visit.  No results found for this or any previous visit.  No results found for this or any previous visit.  No valid procedures specified. No results found for this or any previous visit.  No results found for this or any previous visit.   Assessment & Plan:    1. Elevated PSA -Followup 6 months with PSA - Urinalysis, Routine w reflex microscopic   No follow-ups on file.  Wilkie Aye, MD  [facility] [place]

## 2022-07-29 ENCOUNTER — Telehealth: Payer: Self-pay | Admitting: *Deleted

## 2022-07-29 ENCOUNTER — Other Ambulatory Visit: Payer: Self-pay | Admitting: Internal Medicine

## 2022-07-29 ENCOUNTER — Encounter (HOSPITAL_COMMUNITY): Payer: Self-pay

## 2022-07-29 ENCOUNTER — Telehealth: Payer: Self-pay | Admitting: Internal Medicine

## 2022-07-29 ENCOUNTER — Ambulatory Visit (HOSPITAL_COMMUNITY): Payer: Medicare HMO

## 2022-07-29 NOTE — Telephone Encounter (Signed)
Returned PC to patient, no answer, left VM - he left a message stating his MRI was changed to 08/05/22, wanted to know if his appointment with Dr Barbaraann Cao needs to be changed.  Informed patient his MD appointment will be rescheduled sometime the week of 08/09/22, our scheduling department will contact him.  Instructed patient to call this office with any further questions/concerns, [phone].  Scheduling message sent.

## 2022-08-02 ENCOUNTER — Ambulatory Visit: Payer: Medicare HMO | Admitting: Internal Medicine

## 2022-08-05 ENCOUNTER — Ambulatory Visit (HOSPITAL_COMMUNITY)
Admission: RE | Admit: 2022-08-05 | Discharge: 2022-08-05 | Disposition: A | Discharge status: Home / Self Care | Payer: Medicare HMO | Source: Ambulatory Visit | Attending: Internal Medicine | Admitting: Internal Medicine

## 2022-08-05 DIAGNOSIS — D320 Benign neoplasm of cerebral meninges: Secondary | ICD-10-CM

## 2022-08-05 MED ORDER — GADOBUTROL 1 MMOL/ML IV SOLN
9.0000 mL | Freq: Once | INTRAVENOUS | Status: AC | PRN
  Administered 2022-08-05: 9 mL via INTRAVENOUS

## 2022-08-12 ENCOUNTER — Inpatient Hospital Stay: Payer: Medicare HMO | Attending: Internal Medicine | Admitting: Internal Medicine

## 2022-08-12 DIAGNOSIS — D320 Benign neoplasm of cerebral meninges: Secondary | ICD-10-CM | POA: Diagnosis not present

## 2022-08-12 DIAGNOSIS — R569 Unspecified convulsions: Secondary | ICD-10-CM

## 2022-08-12 NOTE — Progress Notes (Signed)
I connected with Brent Harrington on 08/12/22 at 10:30 AM EDT by telephone visit and verified that I am speaking with the correct person using two identifiers.  I discussed the limitations, risks, security and privacy concerns of performing an evaluation and management service by telemedicine and the availability of in-person appointments. I also discussed with the patient that there may be a patient responsible charge related to this service. The patient expressed understanding and agreed to proceed.  Other persons participating in the visit and their role in the encounter:  n/a  Patient's location:  Home Provider's location:  Office Chief Complaint:  Meningioma, cerebral (HCC)  Focal seizures (HCC)  History of Present Ilness: Brent Harrington reports no clinical changes today.  Denies any further seizures, continues on Keppra.  No headaches. Observations: Language and cognition at baseline  Imaging:  [facility] Clinician Interpretation: I have personally reviewed the CNS images as listed.  My interpretation, in the context of the patient's clinical presentation, is stable disease  MR Brain W Wo Contrast  Result Date: 08/12/2022 CLINICAL DATA:  Meningioma follow-up EXAM: MRI HEAD WITHOUT AND WITH CONTRAST TECHNIQUE: Multiplanar, multiecho pulse sequences of the brain and surrounding structures were obtained without and with intravenous contrast. CONTRAST:  9mL GADAVIST GADOBUTROL 1 MMOL/ML IV SOLN COMPARISON:  None Available. FINDINGS: Brain: No acute infarct, mass effect or extra-axial collection. No acute or chronic hemorrhage. Normal white matter signal, parenchymal volume and CSF spaces. The midline structures are normal. Postsurgical changes of prior meningioma resection. Minimal para falcine nodular contrast enhancement oriented to the right is unchanged (16:126). The amount of adjacent hyperintense T2-weighted signal within the brain parenchyma is unchanged. Vascular: Major flow voids are preserved.  Skull and upper cervical spine: Normal calvarium and skull base. Visualized upper cervical spine and soft tissues are normal. Sinuses/Orbits:No paranasal sinus fluid levels or advanced mucosal thickening. No mastoid or middle ear effusion. Normal orbits. IMPRESSION: 1. Unchanged appearance of minimal residual parafalcine nodular contrast enhancement at the resection site. 2. Unchanged amount of gliosis within the adjacent brain parenchyma. Electronically Signed   By: Deatra Robinson M.D.   On: 08/12/2022 01:02    Assessment and Plan: Meningioma, cerebral (HCC)  Focal seizures (HCC)  Clinically and radiographically stable.  Con't Keppra 750mg  BID.  Follow Up Instructions: RTC in 1 year with MRI brain  I discussed the assessment and treatment plan with the patient.  The patient was provided an opportunity to ask questions and all were answered.  The patient agreed with the plan and demonstrated understanding of the instructions.    The patient was advised to call back or seek an in-person evaluation if the symptoms worsen or if the condition fails to improve as anticipated.    Henreitta Leber, MD   I provided 22 minutes of non face-to-face telephone visit time during this encounter, and > 50% was spent counseling as documented under my assessment & plan.

## 2022-09-07 IMAGING — MR MR HEAD WO/W CM
13 series · 48 of 48 positions shown · IV contrast (gadavist)
Comparison: 03/27/2021

CLINICAL DATA: Follow-up meningioma

EXAM:
MRI HEAD WITHOUT AND WITH CONTRAST
TECHNIQUE: Multiplanar, multiecho pulse sequences of the brain and surrounding
structures were obtained without and with intravenous contrast.
CONTRAST:  9.5mL GADAVIST GADOBUTROL 1 MMOL/ML IV SOLN

[Series 2: DWI · axial · 3.0mm · 1.30mm/px · z∈[-8,+151]mm · 6 of 110 slices shown (1 of 4)]
[im 1/110]
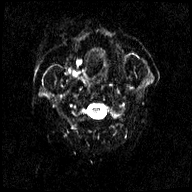
[im 22/110]
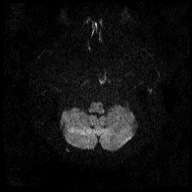
[im 44/110]
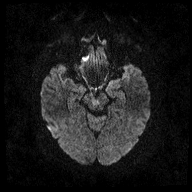
[im 66/110]
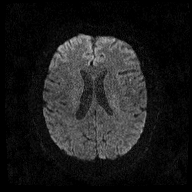
[im 88/110]
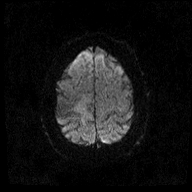
[im 110/110]
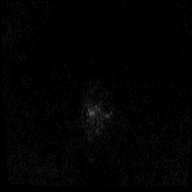

[Series 3: DWI · axial · 3.0mm · 1.30mm/px · z∈[-8,+151]mm · 3 of 53 slices shown (2 of 4)]
[im 1/53]
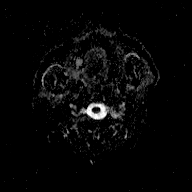
[im 27/53]
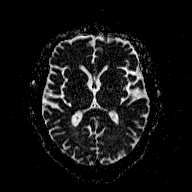
[im 53/53]
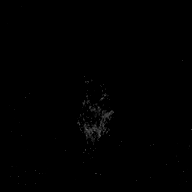

[Series 4: DWI · coronal · 5.0mm · 1.25mm/px · 4 of 70 slices shown (3 of 4)]
[im 1/70]
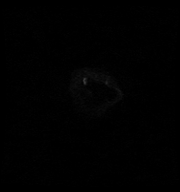
[im 24/70]
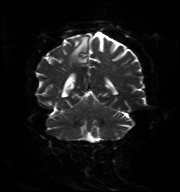
[im 47/70]
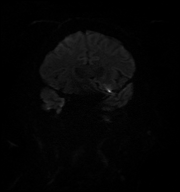
[im 70/70]
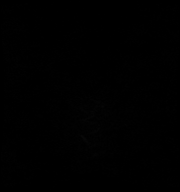

[Series 5: DWI · coronal · 5.0mm · 1.25mm/px · 2 of 36 slices shown (4 of 4)]
[im 1/36]
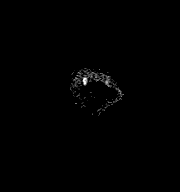
[im 36/36]
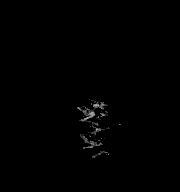

[Series 6: T1 · sagittal · 5.0mm · 0.49mm/px · 2 of 27 slices shown (1 of 2)]
[im 1/27]
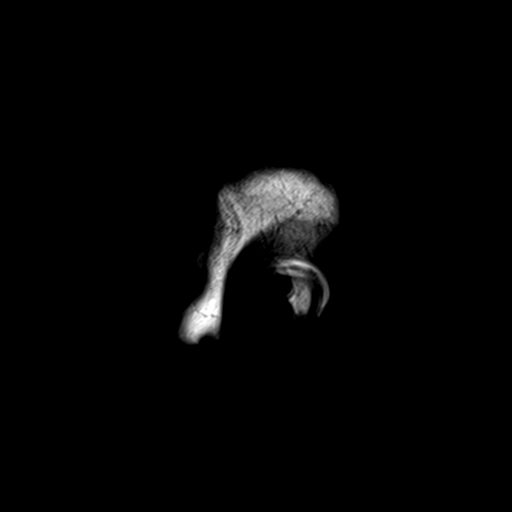
[im 27/27]
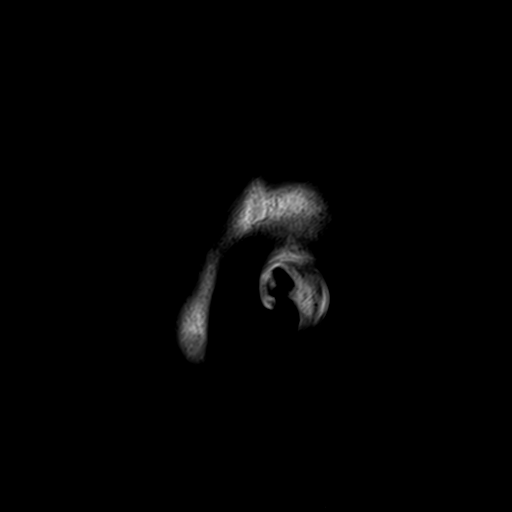

[Series 7: T2 · axial · 5.0mm · 0.72mm/px · 1 of 24 slices shown (1 of 2)]
[im 1/24]
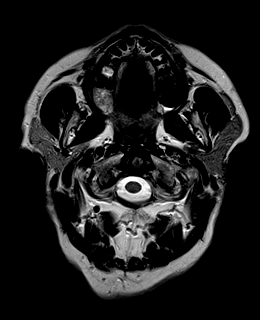

[Series 8: FLAIR · axial · 3.0mm · 0.45mm/px · z∈[-11,+148]mm · 3 of 55 slices shown]
[im 1/55]
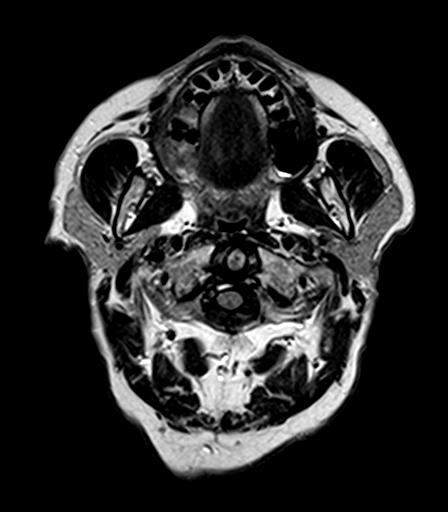
[im 28/55]
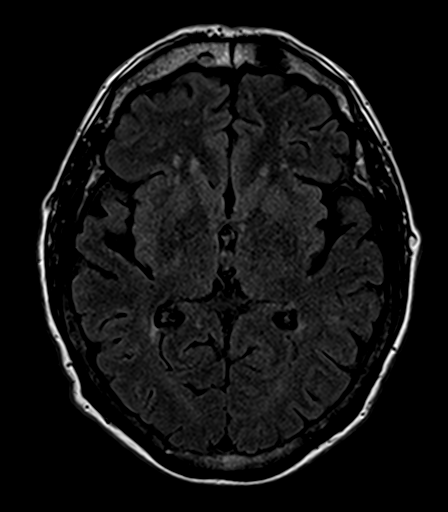
[im 55/55]
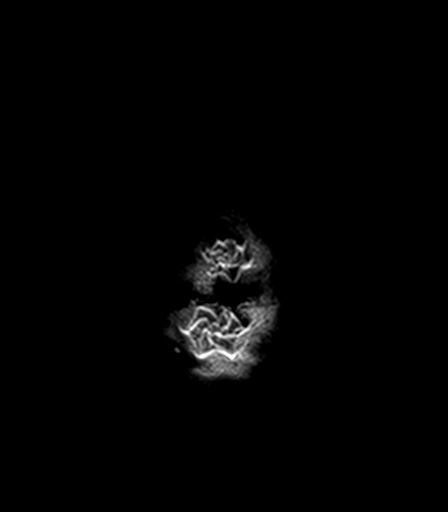

[Series 9: T2 · axial · 5.0mm · 0.72mm/px · 1 of 24 slices shown (2 of 2)]
[im 1/24]
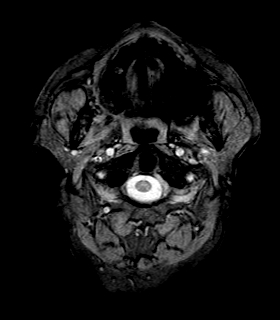

[Series 10: T1 · axial · 1.0mm · 0.94mm/px · z∈[-14,+158]mm · 10 of 176 slices shown (2 of 2)]
[im 1/176]
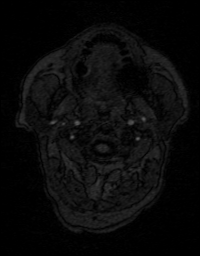
[im 20/176]
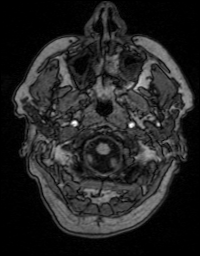
[im 39/176]
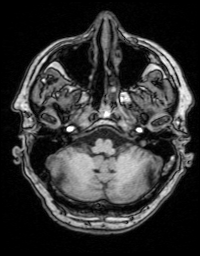
[im 59/176]
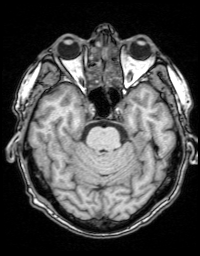
[im 78/176]
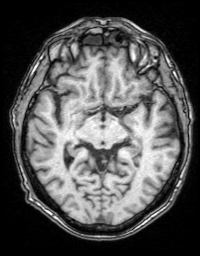
[im 98/176]
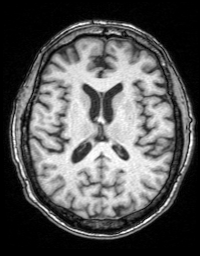
[im 117/176]
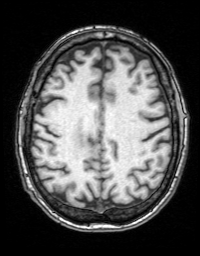
[im 137/176]
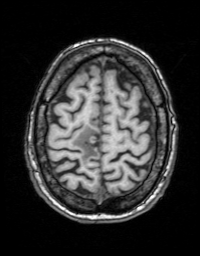
[im 156/176]
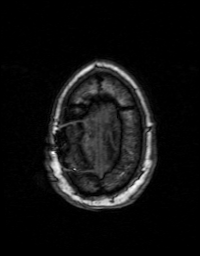
[im 176/176]
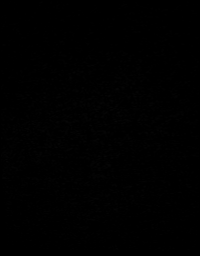

[Series 11: T2 post-contrast · coronal · 5.0mm · 0.43mm/px · 2 of 33 slices shown]
[im 1/33]
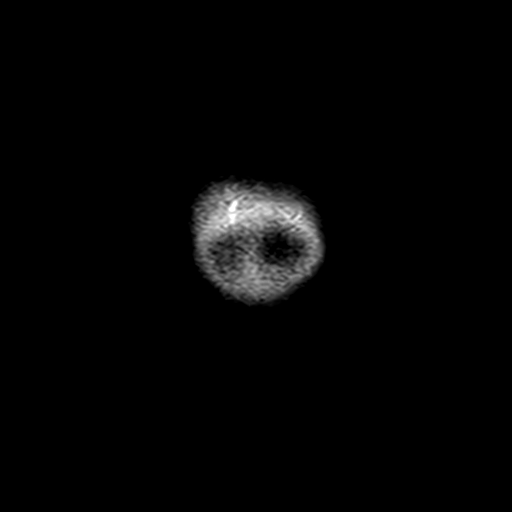
[im 33/33]
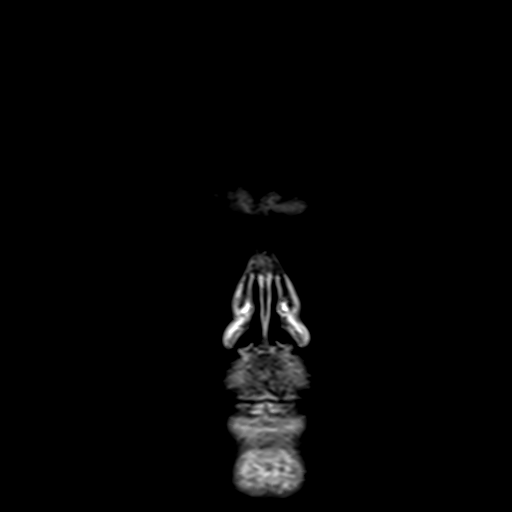

[Series 12: T1 post-contrast · axial · 1.0mm · 0.94mm/px · z∈[-14,+158]mm · 10 of 176 slices shown (1 of 3)]
[im 1/176]
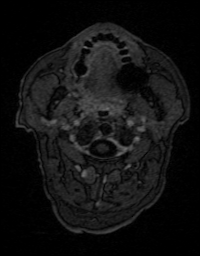
[im 20/176]
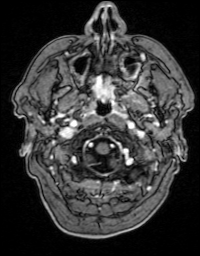
[im 39/176]
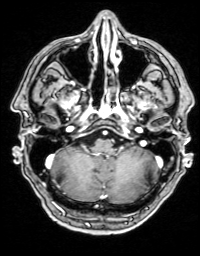
[im 59/176]
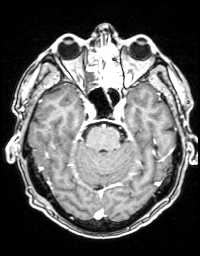
[im 78/176]
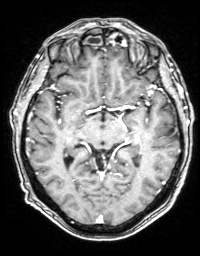
[im 98/176]
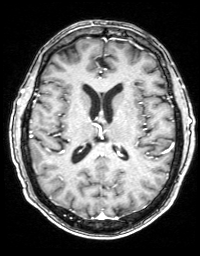
[im 117/176]
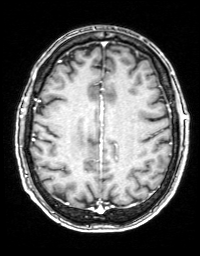
[im 137/176]
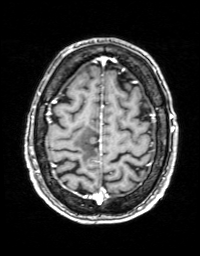
[im 156/176]
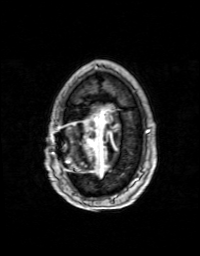
[im 176/176]
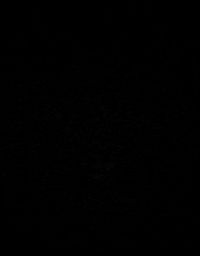

[Series 13: T1 post-contrast · coronal · 5.0mm · 0.43mm/px · 2 of 31 slices shown (2 of 3)]
[im 1/31]
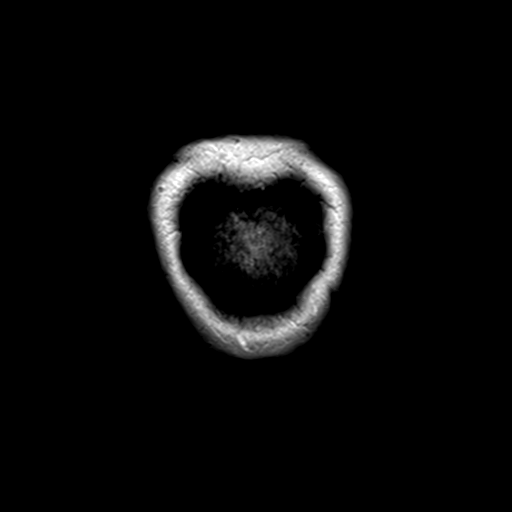
[im 31/31]
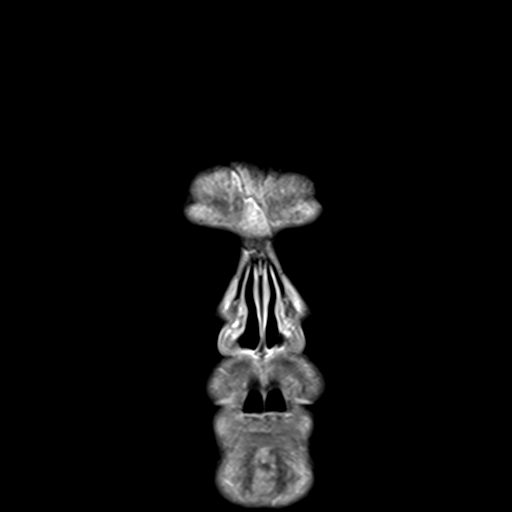

[Series 14: T1 post-contrast · sagittal · 5.0mm · 0.49mm/px · 2 of 27 slices shown (3 of 3)]
[im 1/27]
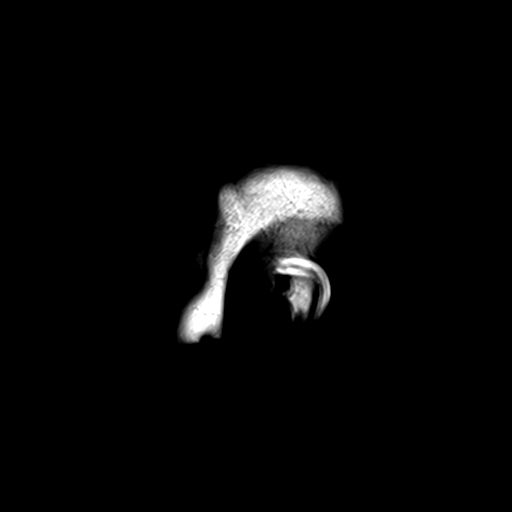
[im 27/27]
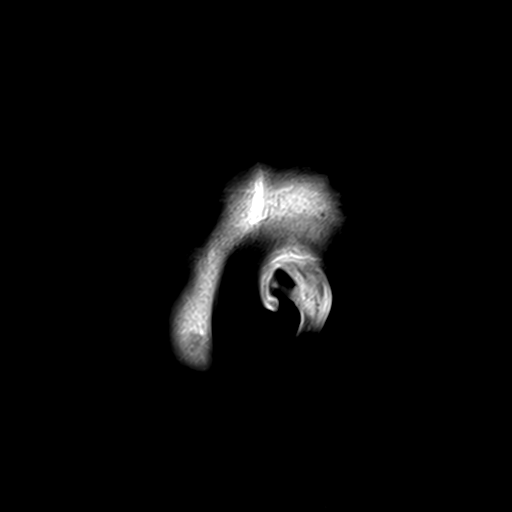

[48 of 48 positions shown; findings below may reference images not displayed]

FINDINGS: Brain: Stable minimal thickening of the right paramedian falx at the
level of tumor resection, area measuring less up to 8 mm. T2
hyperintensity in the adjacent brain is similar to before but
posteriorly there is some increase in T2 signal and possibly and
mild swelling, see [DATE] and compare with [DATE] on prior. No
complicating infarct, hemorrhage, hydrocephalus, or collection. Mild
chronic small vessel ischemic change in the cerebral white matter.

Vascular: Normal flow voids and vascular enhancements

Skull and upper cervical spine: Unremarkable high right frontal
craniotomy site

Sinuses/Orbits: Sinonasal polyposis with diffuse ethmoid and right
more than left frontal opacification, stable. Maxillary sinus
disease is improved. Bilateral nasal cavity polyps.
IMPRESSION: 1. Stable small right parafalcine nodular enhancement at the tumor
resection site. Regional T2 hyperintensity is minimally increased,
evolution suggesting a mix of edema and gliosis.
2. Sinonasal polyposis.

## 2023-01-03 ENCOUNTER — Ambulatory Visit: Payer: Medicare HMO | Admitting: Urology

## 2023-01-03 VITALS — BP 123/79 | HR 83

## 2023-01-03 DIAGNOSIS — R972 Elevated prostate specific antigen [PSA]: Secondary | ICD-10-CM

## 2023-01-03 LAB — URINALYSIS, ROUTINE W REFLEX MICROSCOPIC
Bilirubin, UA: NEGATIVE
Glucose, UA: NEGATIVE
Leukocytes,UA: NEGATIVE
Nitrite, UA: NEGATIVE
RBC, UA: NEGATIVE
Specific Gravity, UA: 1.03 (ref 1.005–1.030)
Urobilinogen, Ur: 1 mg/dL (ref 0.2–1.0)
pH, UA: 5.5 (ref 5.0–7.5)

## 2023-01-03 NOTE — Progress Notes (Signed)
01/03/2023 1:56 PM   Brent Harrington October 08, 1950 [id number]  Referring provider: [facility] [address] [place],  [place] [address]  Followup elevated PSA   HPI: Mr Loo is a 72yo here for followup for elevated PSA. No recent PSA. IPSS 3 QOL 1. Urine stream strong. No straining to urinate. 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 If you have any questions, ask your nurse or doctor.          aspirin EC 81 MG tablet Take 81 mg by mouth daily. Swallow whole.   atorvastatin 10 MG tablet Commonly known as: LIPITOR Take 1 tablet (10 mg total) by mouth daily.   escitalopram 10 MG tablet Commonly known as: LEXAPRO Take 10 mg by mouth daily.   levETIRAcetam 750 MG tablet Commonly known as: KEPPRA TAKE 1 TABLET BY MOUTH TWICE A DAY        Allergies: No Known Allergies  Family History: Family History  Problem Relation Age of Onset   Hypertension Mother    Heart disease Father    Diabetes Sister     Social History:  reports that he has never smoked. He has never used smokeless tobacco. He reports that he does not currently use alcohol. He reports that he does not use drugs.  ROS: All other review of systems were reviewed and are negative  except what is noted above in HPI  Physical Exam: BP 123/79   Pulse 83   Constitutional:  Alert and oriented, No acute distress. HEENT: [place] AT, moist mucus membranes.  Trachea midline, no masses. Cardiovascular: No clubbing, cyanosis, or edema. Respiratory: Normal respiratory effort, no increased work of breathing. GI: Abdomen is soft, nontender, nondistended, no abdominal masses GU: No CVA tenderness.  Lymph: No cervical or inguinal lymphadenopathy. Skin: No rashes, bruises or suspicious lesions. Neurologic: Grossly intact, no focal deficits, moving all 4 extremities. Psychiatric: Normal mood and affect.  Laboratory Data: Lab Results  Component Value Date   WBC 7.1 10/20/2020   HGB 12.6 (L) 10/20/2020   HCT 38.4 (L) 10/20/2020   MCV 87.1 10/20/2020   PLT 266 10/20/2020    Lab Results  Component Value Date   CREATININE 1.09 10/20/2020    No results found for: "PSA"  No results found for: "TESTOSTERONE"  No results found for: "HGBA1C"  Urinalysis    Component Value Date/Time   APPEARANCEUR Clear 07/07/2022 1120   GLUCOSEU Negative 07/07/2022 1120   BILIRUBINUR Negative 07/07/2022 1120   PROTEINUR Negative 07/07/2022 1120   NITRITE Negative 07/07/2022 1120   LEUKOCYTESUR Negative 07/07/2022 1120    Lab Results  Component Value Date   LABMICR Comment 07/07/2022    Pertinent Imaging:  No results found for this or any previous visit.  No results found for this or any previous visit.  No results found for this or any previous visit.  No results found for this or any previous visit.  No results found for this or any previous visit.  No valid procedures specified. No results found for this or any previous visit.  No results found for this or any previous visit.   Assessment & Plan:    1. Elevated PSA PSA today, if stable he will followup in 6 months with a PSA - Urinalysis, Routine w reflex microscopic   No follow-ups on file.  Wilkie Aye,  MD  [facility]

## 2023-01-04 LAB — PSA: Prostate Specific Ag, Serum: 11.6 ng/mL — ABNORMAL HIGH (ref 0.0–4.0)

## 2023-01-11 ENCOUNTER — Encounter: Payer: Self-pay | Admitting: Urology

## 2023-01-11 NOTE — Patient Instructions (Signed)

## 2023-01-24 ENCOUNTER — Other Ambulatory Visit: Payer: Self-pay | Admitting: Internal Medicine

## 2023-07-05 ENCOUNTER — Other Ambulatory Visit: Payer: Medicare HMO

## 2023-07-06 ENCOUNTER — Telehealth: Payer: Self-pay | Admitting: Urology

## 2023-07-06 LAB — PSA: Prostate Specific Ag, Serum: 9.7 ng/mL — ABNORMAL HIGH (ref 0.0–4.0)

## 2023-07-06 NOTE — Telephone Encounter (Signed)
 Patient notified via mychart to keep scheduled appointment with results.

## 2023-07-06 NOTE — Telephone Encounter (Signed)
 Caller wants PSA results

## 2023-07-13 ENCOUNTER — Encounter: Payer: Self-pay | Admitting: Urology

## 2023-07-13 ENCOUNTER — Ambulatory Visit: Payer: Medicare HMO | Admitting: Urology

## 2023-07-13 VITALS — BP 112/74 | HR 79

## 2023-07-13 DIAGNOSIS — R972 Elevated prostate specific antigen [PSA]: Secondary | ICD-10-CM | POA: Diagnosis not present

## 2023-07-13 LAB — URINALYSIS, ROUTINE W REFLEX MICROSCOPIC
Bilirubin, UA: NEGATIVE
Glucose, UA: NEGATIVE
Ketones, UA: NEGATIVE
Leukocytes,UA: NEGATIVE
Nitrite, UA: NEGATIVE
Protein,UA: NEGATIVE
RBC, UA: NEGATIVE
Specific Gravity, UA: 1.03 (ref 1.005–1.030)
Urobilinogen, Ur: 0.2 mg/dL (ref 0.2–1.0)
pH, UA: 6 (ref 5.0–7.5)

## 2023-07-13 NOTE — Patient Instructions (Signed)

## 2023-07-13 NOTE — Progress Notes (Signed)
 07/13/2023 2:52 PM   Zondra Hipps 1951-02-27 [id number]  Referring provider: [facility] [address] [place],  [place] [address]  Elevated PSA   HPI: Mr Pitcock is a 73yo here for followup for elevated PSA. PSA decreased to 9.7 from 11.6. IPSS 3 QOl 1 on no BPH therapy. Urine stream strong. No straining to urinate. Nocturia 0-1x.    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 If you have any questions, ask your nurse or doctor.          aspirin  EC 81 MG tablet Take 81 mg by mouth daily. Swallow whole.   atorvastatin  10 MG tablet Commonly known as: LIPITOR Take 1 tablet (10 mg total) by mouth daily.   escitalopram  10 MG tablet Commonly known as: LEXAPRO  Take 10 mg by mouth daily.   levETIRAcetam  750 MG tablet Commonly known as: KEPPRA  TAKE 1 TABLET BY MOUTH TWICE A DAY        Allergies: No Known Allergies  Family History: Family History  Problem Relation Age of Onset   Hypertension Mother    Heart disease Father    Diabetes Sister     Social History:  reports that he has never smoked. He has never used smokeless tobacco. He reports that he does not currently use alcohol. He reports that he does not use drugs.  ROS: All other review of systems were reviewed and  are negative except what is noted above in HPI  Physical Exam: BP 112/74   Pulse 79   Constitutional:  Alert and oriented, No acute distress. HEENT: [place] AT, moist mucus membranes.  Trachea midline, no masses. Cardiovascular: No clubbing, cyanosis, or edema. Respiratory: Normal respiratory effort, no increased work of breathing. GI: Abdomen is soft, nontender, nondistended, no abdominal masses GU: No CVA tenderness.  Lymph: No cervical or inguinal lymphadenopathy. Skin: No rashes, bruises or suspicious lesions. Neurologic: Grossly intact, no focal deficits, moving all 4 extremities. Psychiatric: Normal mood and affect.  Laboratory Data: Lab Results  Component Value Date   WBC 7.1 10/20/2020   HGB 12.6 (L) 10/20/2020   HCT 38.4 (L) 10/20/2020   MCV 87.1 10/20/2020   PLT 266 10/20/2020    Lab Results  Component Value Date   CREATININE 1.09 10/20/2020    No results found for: "PSA"  No results found for: "TESTOSTERONE"  No results found for: "HGBA1C"  Urinalysis    Component Value Date/Time   APPEARANCEUR Clear 01/03/2023 1332   GLUCOSEU Negative 01/03/2023 1332   BILIRUBINUR Negative 01/03/2023 1332   PROTEINUR Trace 01/03/2023 1332   NITRITE Negative 01/03/2023 1332   LEUKOCYTESUR Negative 01/03/2023 1332    Lab Results  Component Value Date   LABMICR Comment 01/03/2023  Pertinent Imaging:  No results found for this or any previous visit.  No results found for this or any previous visit.  No results found for this or any previous visit.  No results found for this or any previous visit.  No results found for this or any previous visit.  No results found for this or any previous visit.  No results found for this or any previous visit.  No results found for this or any previous visit.   Assessment & Plan:    1. Elevated PSA (Primary) Followup 6 months with a PSA - Urinalysis, Routine w reflex microscopic   No follow-ups on file.  Johnie Nailer, MD  [facility] 

## 2023-08-04 ENCOUNTER — Ambulatory Visit (HOSPITAL_COMMUNITY)
Admission: RE | Admit: 2023-08-04 | Discharge: 2023-08-04 | Disposition: A | Discharge status: Home / Self Care | Source: Ambulatory Visit | Attending: Internal Medicine | Admitting: Internal Medicine

## 2023-08-04 DIAGNOSIS — D320 Benign neoplasm of cerebral meninges: Secondary | ICD-10-CM | POA: Diagnosis present

## 2023-08-04 MED ORDER — GADOBUTROL 1 MMOL/ML IV SOLN
10.0000 mL | Freq: Once | INTRAVENOUS | Status: AC | PRN
  Administered 2023-08-04: 10 mL via INTRAVENOUS

## 2023-08-05 ENCOUNTER — Other Ambulatory Visit: Payer: Self-pay

## 2023-08-11 ENCOUNTER — Inpatient Hospital Stay: Payer: Medicare HMO | Attending: Internal Medicine | Admitting: Internal Medicine

## 2023-08-11 DIAGNOSIS — D320 Benign neoplasm of cerebral meninges: Secondary | ICD-10-CM

## 2023-08-11 DIAGNOSIS — R569 Unspecified convulsions: Secondary | ICD-10-CM

## 2023-08-11 NOTE — Progress Notes (Signed)
 I connected with Brent Harrington on 08/11/23 at 10:30 AM EDT by telephone visit and verified that I am speaking with the correct person using two identifiers.  I discussed the limitations, risks, security and privacy concerns of performing an evaluation and management service by telemedicine and the availability of in-person appointments. I also discussed with the patient that there may be a patient responsible charge related to this service. The patient expressed understanding and agreed to proceed.  Other persons participating in the visit and their role in the encounter:  n/a  Patient's location:  Home Provider's location:  Office Chief Complaint:  Meningioma, cerebral (HCC)  Focal seizures (HCC)  History of Present Ilness: Brent Harrington reports no neurologic changes today.  Denies any further seizures, continues on Keppra .  No headaches.  Observations: Language and cognition at baseline  Imaging:  [facility] Clinician Interpretation: I have personally reviewed the CNS images as listed.  My interpretation, in the context of the patient's clinical presentation, is stable disease  MR BRAIN W WO CONTRAST Result Date: 08/04/2023 CLINICAL DATA:  Provided history: Meningioma, cerebral. Brain/CNS neoplasm, assess treatment response. EXAM: MRI HEAD WITHOUT AND WITH CONTRAST TECHNIQUE: Multiplanar, multiecho pulse sequences of the brain and surrounding structures were obtained without and with intravenous contrast. CONTRAST:  10mL GADAVIST  GADOBUTROL  1 MMOL/ML IV SOLN COMPARISON:  Prior brain MRI examinations 08/05/2022 and earlier. FINDINGS: Brain: Prior vertex parietal craniotomy and resection of a right parafalcine meningioma. Nonspecific focus of nodular enhancement along the right aspect of the falx at the level of tumor resection, unchanged from the prior brain MRI of 08/05/2022 (for instance as seen on series 18, image 130). As before, this measures 8 x 3 mm in transaxial dimensions.  Encephalomalacia/gliosis (and mild chronic hemosiderin deposition) within the adjacent right frontal and parietal lobes, also unchanged. Mild multifocal T2 FLAIR hyperintense signal abnormality elsewhere within the cerebral white matter, nonspecific but compatible with chronic small vessel ischemic disease. There is no acute infarct. No extra-axial fluid collection. No midline shift. Vascular: Maintained flow voids within the proximal large arterial vessels. Skull and upper cervical spine: Vertex right parietal craniotomy. No focal worrisome marrow lesion. Sinuses/Orbits: No mass or acute finding within the imaged orbits. Severe right frontal sinusitis with complete sinus opacification. Moderate-to-severe left frontal sinusitis. Severe bilateral ethmoid sinusitis. Mild mucosal thickening within the right sphenoid and right maxillary sinuses. 14 mm mucous retention cyst, and mild background mucosal thickening, within the left maxillary sinus. IMPRESSION: 1. Small focus of nodular enhancement along the right aspect of the falx (at the level of prior tumor resection), unchanged from the prior brain MRI of 08/05/2022. This may be related to prior surgery or may reflect a small residual meningioma. Encephalomalacia/gliosis again demonstrated within the adjacent right frontal and parietal lobes. 2. Background mild cerebral white matter chronic small vessel ischemic disease. 3. Significant paranasal sinus disease as described. Electronically Signed   By: Bascom Lily D.O.   On: 08/04/2023 14:55    Assessment and Plan: Meningioma, cerebral (HCC)  Focal seizures (HCC)  Clinically and radiographically stable.  No new or progressive changes.  May decrease Keppra  to 375mg  BID.  Follow Up Instructions: RTC in 1 year with MRI brain  I discussed the assessment and treatment plan with the patient.  The patient was provided an opportunity to ask questions and all were answered.  The patient agreed with the plan and  demonstrated understanding of the instructions.    The patient was advised to call back or seek  an in-person evaluation if the symptoms worsen or if the condition fails to improve as anticipated.    Shuntavia Yerby K Kinleigh Nault, MD   I provided 22 minutes of non face-to-face telephone visit time during this encounter, and > 50% was spent counseling as documented under my assessment & plan.

## 2024-01-12 ENCOUNTER — Other Ambulatory Visit

## 2024-01-12 DIAGNOSIS — R972 Elevated prostate specific antigen [PSA]: Secondary | ICD-10-CM

## 2024-01-13 LAB — PSA: Prostate Specific Ag, Serum: 13.2 ng/mL — ABNORMAL HIGH (ref 0.0–4.0)

## 2024-01-17 ENCOUNTER — Ambulatory Visit: Payer: Self-pay

## 2024-01-18 ENCOUNTER — Ambulatory Visit: Admitting: Urology

## 2024-01-18 VITALS — BP 131/80 | HR 76

## 2024-01-18 DIAGNOSIS — R972 Elevated prostate specific antigen [PSA]: Secondary | ICD-10-CM | POA: Diagnosis not present

## 2024-01-18 LAB — URINALYSIS, ROUTINE W REFLEX MICROSCOPIC
Bilirubin, UA: NEGATIVE
Glucose, UA: NEGATIVE
Leukocytes,UA: NEGATIVE
Nitrite, UA: NEGATIVE
RBC, UA: NEGATIVE
Specific Gravity, UA: 1.03 (ref 1.005–1.030)
Urobilinogen, Ur: 1 mg/dL (ref 0.2–1.0)
pH, UA: 6 (ref 5.0–7.5)

## 2024-01-18 NOTE — Progress Notes (Signed)
 01/18/2024 2:01 PM   Brent Harrington [birth year] [id number]  Referring provider: [facility] [address] [place],  [place] [address]  Followup elevated PSA   HPI: Brent Harrington is a 73yo here for followup for elevated PSA. PSA increased to 13.2 from 9.7. No worsening LUTS. No dysuria or hematuria.    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 If you have any questions, ask your nurse or doctor.          aspirin  EC 81 MG tablet Take 81 mg by mouth daily. Swallow whole.   atorvastatin  10 MG tablet Commonly known as: LIPITOR Take 1 tablet (10 mg total) by mouth daily.   escitalopram  10 MG tablet Commonly known as: LEXAPRO  Take 10 mg by mouth daily.   levETIRAcetam  750 MG tablet Commonly known as: KEPPRA  TAKE 1 TABLET BY MOUTH TWICE A DAY        Allergies: No Known Allergies  Family History: Family History  Problem Relation Age of Onset   Hypertension Mother    Heart disease Father    Diabetes Sister     Social History:  reports that he has never smoked. He has never used smokeless tobacco. He reports that he does not currently use alcohol. He reports that he does not use drugs.  ROS: All other review of systems were reviewed and are negative except what is noted  above in HPI  Physical Exam: BP 131/80   Pulse 76   Constitutional:  Alert and oriented, No acute distress. HEENT: Brent Harrington AT, moist mucus membranes.  Trachea midline, no masses. Cardiovascular: No clubbing, cyanosis, or edema. Respiratory: Normal respiratory effort, no increased work of breathing. GI: Abdomen is soft, nontender, nondistended, no abdominal masses GU: No CVA tenderness.  Lymph: No cervical or inguinal lymphadenopathy. Skin: No rashes, bruises or suspicious lesions. Neurologic: Grossly intact, no focal deficits, moving all 4 extremities. Psychiatric: Normal mood and affect.  Laboratory Data: Lab Results  Component Value Date   WBC 7.1 10/20/2020   HGB 12.6 (L) 10/20/2020   HCT 38.4 (L) 10/20/2020   MCV 87.1 10/20/2020   PLT 266 10/20/2020    Lab Results  Component Value Date   CREATININE 1.09 10/20/2020    No results found for: PSA  No results found for: TESTOSTERONE  No results found for: HGBA1C  Urinalysis    Component Value Date/Time   APPEARANCEUR Clear 07/13/2023 1435   GLUCOSEU Negative 07/13/2023 1435   BILIRUBINUR Negative 07/13/2023 1435   PROTEINUR Negative 07/13/2023 1435   NITRITE Negative 07/13/2023 1435   LEUKOCYTESUR Negative 07/13/2023 1435    Lab Results  Component Value Date   LABMICR Comment 07/13/2023    Pertinent Imaging:  No results found  for this or any previous visit.  No results found for this or any previous visit.  No results found for this or any previous visit.  No results found for this or any previous visit.  No results found for this or any previous visit.  No results found for this or any previous visit.  No results found for this or any previous visit.  No results found for this or any previous visit.   Assessment & Plan:    1. Elevated PSA (Primary) IsoPSA, will call with results. If posiitve we will proceed with prostate MRI. If negative I will see him back in 6 months with a PSA -  Urinalysis, Routine w reflex microscopic   No follow-ups on file.  Belvie Clara, MD  [facility] [place]

## 2024-01-24 ENCOUNTER — Telehealth: Payer: Self-pay

## 2024-01-24 ENCOUNTER — Encounter: Payer: Self-pay | Admitting: Urology

## 2024-01-24 NOTE — Patient Instructions (Signed)

## 2024-02-01 ENCOUNTER — Telehealth: Payer: Self-pay

## 2024-02-01 NOTE — Telephone Encounter (Signed)
 Release: [id number] Complete 02/01/2024

## 2024-07-09 ENCOUNTER — Other Ambulatory Visit

## 2024-07-16 ENCOUNTER — Ambulatory Visit: Admitting: Urology

## 2024-08-09 ENCOUNTER — Inpatient Hospital Stay: Admitting: Internal Medicine
